# Patient Record
Sex: Female | Born: 1950 | Race: White | Hispanic: No | Marital: Married | Smoking: Current some day smoker
Health system: Southern US, Community
[De-identification: ages and names within clinical notes are randomized; demographics above are authoritative.]

## PROBLEM LIST (undated history)

## (undated) DIAGNOSIS — L409 Psoriasis, unspecified: Secondary | ICD-10-CM

## (undated) DIAGNOSIS — R519 Headache, unspecified: Secondary | ICD-10-CM

## (undated) DIAGNOSIS — Z9889 Other specified postprocedural states: Secondary | ICD-10-CM

## (undated) DIAGNOSIS — I10 Essential (primary) hypertension: Secondary | ICD-10-CM

## (undated) DIAGNOSIS — J189 Pneumonia, unspecified organism: Secondary | ICD-10-CM

## (undated) DIAGNOSIS — J302 Other seasonal allergic rhinitis: Secondary | ICD-10-CM

## (undated) DIAGNOSIS — R0600 Dyspnea, unspecified: Secondary | ICD-10-CM

## (undated) DIAGNOSIS — R112 Nausea with vomiting, unspecified: Secondary | ICD-10-CM

## (undated) DIAGNOSIS — G43909 Migraine, unspecified, not intractable, without status migrainosus: Secondary | ICD-10-CM

## (undated) DIAGNOSIS — D649 Anemia, unspecified: Secondary | ICD-10-CM

## (undated) DIAGNOSIS — T4145XA Adverse effect of unspecified anesthetic, initial encounter: Secondary | ICD-10-CM

## (undated) DIAGNOSIS — J449 Chronic obstructive pulmonary disease, unspecified: Secondary | ICD-10-CM

## (undated) DIAGNOSIS — T8859XA Other complications of anesthesia, initial encounter: Secondary | ICD-10-CM

## (undated) DIAGNOSIS — F17200 Nicotine dependence, unspecified, uncomplicated: Secondary | ICD-10-CM

## (undated) DIAGNOSIS — E785 Hyperlipidemia, unspecified: Secondary | ICD-10-CM

## (undated) HISTORY — PX: TUBAL LIGATION: SHX77

## (undated) HISTORY — PX: PARTIAL HYSTERECTOMY: SHX80

## (undated) HISTORY — DX: Migraine, unspecified, not intractable, without status migrainosus: G43.909

## (undated) HISTORY — DX: Essential (primary) hypertension: I10

## (undated) HISTORY — PX: ABDOMINAL HYSTERECTOMY: SHX81

## (undated) HISTORY — DX: Other seasonal allergic rhinitis: J30.2

## (undated) HISTORY — DX: Psoriasis, unspecified: L40.9

## (undated) HISTORY — PX: CATARACT EXTRACTION: SUR2

## (undated) HISTORY — DX: Hyperlipidemia, unspecified: E78.5

---
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
## 2007-08-29 ENCOUNTER — Ambulatory Visit: Payer: Self-pay | Admitting: Cardiology

## 2007-09-04 ENCOUNTER — Ambulatory Visit: Payer: Self-pay | Admitting: Cardiology

## 2007-10-10 ENCOUNTER — Ambulatory Visit: Payer: Self-pay | Admitting: Cardiology

## 2008-10-24 DIAGNOSIS — R079 Chest pain, unspecified: Secondary | ICD-10-CM | POA: Insufficient documentation

## 2008-10-24 DIAGNOSIS — R0602 Shortness of breath: Secondary | ICD-10-CM | POA: Insufficient documentation

## 2008-10-24 DIAGNOSIS — E785 Hyperlipidemia, unspecified: Secondary | ICD-10-CM | POA: Insufficient documentation

## 2010-06-30 NOTE — Assessment & Plan Note (Signed)
[facility]                          [facility] NOTE   NAME:Flakes, LANESHIA PINA                MRN:          [id number]  DATE:08/29/2007                            DOB:          07/16/1950    REFERRING PHYSICIAN:  Fara Chute, MD   PRIMARY CARDIOLOGIST:  Jonelle Sidle, MD (new).   REASON FOR CONSULTATION:  Ms. Mcclenahan is a pleasant 60 year old female,  with no prior history of heart disease, now referred to Dr. Simona Huh  for evaluation of recent episodes of nocturnal chest pain.   Ms. Miltner refers back to a school trip to [place]. this past  April, at which time she experienced significant dyspnea with moderate  exertion.  She also seems to suggest symptoms of intermittent  claudication, worse on the right, associated with significant walking  that she did during that trip.  When pressed about any chest discomfort,  however, she denies any frank chest pain; however, she clearly had  significant exertional dyspnea but declined to characterize this as  associated with any chest pressure, tightness, or heaviness.   Since her return, she reports 2 discrete episodes of right-sided chest  pain which awoke her in the middle of the night.  Both were associated  with significant diaphoresis, but no radiation to the arms or jaw.  There was also no associated nausea.  She reports that she took aspirin  and that the symptoms subsequently resolved, although she is not clear  as to the duration.  Her last episode was over 1 month ago.   EKG in our office today reveals NSR at 93 bpm with borderline LAD; RSR1  pattern with left hemifascicular block.  There are no ischemic changes.   ALLERGIES:  NOVOCAIN.   HOME MEDICATIONS:  Fish oil, vitamins.   PAST MEDICAL HISTORY:  1. Migraines.  2. Palpitations.   SURGICAL HISTORY:  Partial hysterectomy.   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  She smokes at least  2 packs a day and started at age 89.  She drinks an occasional glass of  red wine.   FAMILY HISTORY:  Brother aged 37, hypertension.   REVIEW OF SYSTEMS:  Denies history of hypertension or diabetes mellitus.  Has not had any palpitations or fluttering, since she cut back on  caffeinated beverages about 6 years ago.  She reportedly can climb a  flight of stairs with some mild shortness of breath and associated leg  pain but denies any associated chest pain.  Otherwise as noted per HPI,  remaining systems negative.   PHYSICAL EXAMINATION:  VITAL SIGNS:  Blood pressure 148/98; pulse 88,  regular; weight 116.  GENERAL:  A 60 year old female, sitting upright, in no distress.  HEENT:  Normocephalic, atraumatic.  NECK:  Palpable carotid pulses without bruits; no JVD at 90 degrees.  LUNGS:  Clear to auscultation bilaterally.  HEART:  Regular rate and rhythm (S1S2).  No significant murmurs.  No  rubs or gallops.  ABDOMEN:  Soft, nontender with intact bowel sounds.  EXTREMITIES:  Palpable bilateral  femoral pulses without bruits; palpable  dorsalis pedis pulses, although less brisk on the left.  Minimally  palpable posterior tibialis pulses bilaterally.  No pedal edema.  Nonpalpable popliteal pulses.  NEURO:  No focal deficit.   IMPRESSION:  1. New-onset chest pain.      a.     Nonexertional.  2. Exertional dyspnea.  3. Remote palpitations.  4. Longstanding tobacco.  5. Hypertension.      a.     Question new onset.  6. Dyslipidemia.  7. History of migraines.   PLAN:  Following review with Dr. Simona Huh, recommendations are as  follows:  1. Exercise stress Cardiolite for risk stratification.  If this      suggests any evidence of ischemia, then recommendation is to      discuss proceeding with further evaluation with a diagnostic      coronary angiogram to exclude significant underlying CAD.  The       patient is quite agreeable with this plan and has opted to proceed      with a noninvasive approach, initially.  Of note, however, I did      advise her that we would keep a low threshold for further workup      with a cardiac catheterization.  2. 2D echocardiogram for assessment of left ventricular function and      rule-out of underlying structural abnormalities.  3. Baseline chest x-ray.  The patient has a longstanding history of      tobacco smoking, has developed significant exertional dyspnea in      the recent past, and reportedly has not had a chest x-ray for some      time now.  4. Recommend starting low-dose aspirin, 81 mg daily.  5. Schedule early clinic followup with myself and Dr. Diona Browner in 1      month, for review of study results and further recommendations.      Rozell Searing, PA-C  Electronically Signed      Jonelle Sidle, MD  Electronically Signed   GS/MedQ  DD: 08/29/2007  DT: 08/30/2007  Job #: [id number]   cc:   Fara Chute, MD

## 2010-06-30 NOTE — Assessment & Plan Note (Signed)
[facility]                          [facility]   NAME:Withers, NIKAELA COYNE                MRN:          [id number]  DATE:10/10/2007                            DOB:          [birth year]    PRIMARY CARE PHYSICIAN:  Dr. Fara Chute.   PRIMARY CARDIOLOGIST:  Jonelle Sidle, MD   REASON FOR VISIT:  One-month followup.   HISTORY OF PRESENT ILLNESS:  Virginia Powell is a 60 year old female patient  who recently saw Gene Serpe, PA-C and Dr. Nona Dell for chest pain  and dyspnea.  She also complained of leg pain and some swelling.  All  these began after a trip to [place] where she was on her feet for  several hours a day.  The swelling has been intermittent.  She was set  up for a stress Cardiolite study.  This showed no ischemia and normal LV  function.  Her echocardiogram also returned normal with an EF of 55% to  60%.  She had some mild mitral regurgitation and mild tricuspid  regurgitation.  RV systolic pressure was 34 millimeters of mercury.  She  had a chest x-ray done secondary to a long history of smoking, which  revealed COPD and no active disease.  She returns to follow up today.  She has had no recurrent chest pain.  She has had no significant  shortness of breath since she was last seen.  She denies syncope, near  syncope.  She did eat a hot dog the other day that resulted in a  migraine headache.  This was fairly typical for her.  She had some  residual lightheadedness yesterday.  She denies any wheezing or cough.  She really denies any symptoms consistent with claudication.  As noted,  her lower extremity swelling has been intermittent.  She has noticed  some palpable nodules associated with the swelling.  She has some  discoloration when the swelling goes down as well.   CURRENT MEDICATIONS:  Multivitamin, vitamin E, fish oil, aspirin, and  Maxalt p.r.n.   PHYSICAL EXAMINATION:  GENERAL:  She is a  well-nourished, well-developed  female, in no distress.  VITAL SIGNS:  Blood pressure is 136/87, pulse of 99, and weight 116.8  pounds.  HEENT:  Normal.  NECK:  Without JVD.  CARDIAC:  Normal S1 and S2.  Regular rate and rhythm.  LUNGS:  Clear to auscultation bilaterally.  ABDOMEN:  Soft and nontender.  EXTREMITIES:  Without edema.  NEUROLOGIC:  She is alert and oriented x3.  Cranial nerves II-XII are  grossly intact.  VASCULAR:  Femoral artery pulses are 2+ bilaterally without bruits.  Popliteal pulses are difficult to palpate.  Dorsalis pedis and posterior  tibialis pulses are 2+ bilaterally.  SKIN:  Warm and dry.  She does have some mild varicosities noted on the  left over the anterior tibia.   ASSESSMENT AND PLAN:  1. Chest pain and shortness breath with recent nonischemic Cardiolite      study and essentially normal echocardiogram except for mild mitral      regurgitation and mild tricuspid regurgitation.  Her chest symptoms  have essentially resolved since she was last seen.  She has been      reassured regarding the findings of the above testing.  No further      cardiovascular testing is warranted at this time.  She can follow      up with our clinic on a p.r.n. basis.  She should continue on      aspirin for primary prevention.  She should also continue follow up      with Dr. Neita Carp for her lipids and proceed with treatment if felt      to be necessary.  2. Leg pain and swelling.  She does have some evidence of      varicosities.  I questioned whether or not she has some venous      insufficiency.  This would coincide with the onset of her symptoms.      She has excellent distal pulses.  She has no evidence of peripheral      arterial disease on exam.  No further testing is warranted at this      time.  3. Dyslipidemia.  As noted above, she should follow up with Dr. Neita Carp      and proceed with treatment if felt to be necessary.  4. Chronic obstructive pulmonary  disease with ongoing tobacco abuse.      The patient has been urged to quit smoking.  She is cutting back      and hopes to quit smoking in the near feature.   DISPOSITION:  As noted above.  The patient will follow up with Dr.  Diona Browner on a p.r.n. basis.      Tereso Newcomer, PA-C  Electronically Signed      Jonelle Sidle, MD  Electronically Signed   SW/MedQ  DD: 10/10/2007  DT: 10/11/2007  Job #: [id number]   cc:   Fara Chute, MD

## 2015-03-03 DIAGNOSIS — I10 Essential (primary) hypertension: Secondary | ICD-10-CM | POA: Insufficient documentation

## 2015-03-03 DIAGNOSIS — L409 Psoriasis, unspecified: Secondary | ICD-10-CM | POA: Insufficient documentation

## 2015-03-04 ENCOUNTER — Ambulatory Visit (INDEPENDENT_AMBULATORY_CARE_PROVIDER_SITE_OTHER): Payer: PRIVATE HEALTH INSURANCE | Admitting: Cardiology

## 2015-03-04 ENCOUNTER — Encounter: Payer: Self-pay | Admitting: Cardiology

## 2015-03-04 VITALS — BP 130/86 | HR 95 | Ht 64.0 in | Wt 116.0 lb

## 2015-03-04 DIAGNOSIS — Z8669 Personal history of other diseases of the nervous system and sense organs: Secondary | ICD-10-CM

## 2015-03-04 DIAGNOSIS — I10 Essential (primary) hypertension: Secondary | ICD-10-CM | POA: Diagnosis not present

## 2015-03-04 DIAGNOSIS — R072 Precordial pain: Secondary | ICD-10-CM | POA: Diagnosis not present

## 2015-03-04 DIAGNOSIS — Z720 Tobacco use: Secondary | ICD-10-CM

## 2015-03-04 DIAGNOSIS — E782 Mixed hyperlipidemia: Secondary | ICD-10-CM

## 2015-03-04 NOTE — Progress Notes (Signed)
Cardiology Office Note  Date: 03/04/2015   ID: Virginia Powell, DOB [birth year], MRN [id number]  PCP: Jobe Marker  Consulting Cardiologist: Nona Dell, MD   Chief Complaint  Patient presents with  . Chest Pain  . Hypertension    History of Present Illness: Virginia Powell is a 65 y.o. female referred for cardiology consultation by Ms. Virginia Daring PA-C with [facility]. She presents today with her husband for further evaluation. She tells me that over the last few months she has had trouble with uncontrolled hypertension, describes diastolics as high as 115 to 120 at times, systolics 160 to 170 at times. She has been on lisinopril for essential hypertension, reports tolerating this medication over the years. Typically, she has been on a very low dose, but dose has been increased in the last few weeks, and her blood pressure does seem to be coming down.  She also reports having an intermittent feeling of cramping in her chest, this has occurred within the last few months as well, not purely exertional. She states that she takes an aspirin when this happens. She is also troubled by increasing migraines recently. At baseline she reports NYHA class II dyspnea.  Record review finds previous evaluation by our practice back in 2009. She underwent a Cardiolite study at that time that was negative for ischemia, also showed normal LVEF. She has not undergone interval ischemic testing.  I reviewed her recent ECG, outlined below. I also reviewed her history and updated the chart.  Past Medical History  Diagnosis Date  . Essential hypertension   . Seasonal allergies   . Hyperlipidemia   . Psoriasis   . Migraines     Past Surgical History  Procedure Laterality Date  . Partial hysterectomy      Current Outpatient Prescriptions  Medication Sig Dispense Refill  . fluticasone (FLONASE) 50 MCG/ACT nasal spray Place into both nostrils daily.    Marland Kitchen lisinopril  (PRINIVIL,ZESTRIL) 10 MG tablet Take 10 mg by mouth 2 (two) times daily.    . rizatriptan (MAXALT-MLT) 10 MG disintegrating tablet Take 10 mg by mouth as needed for migraine. May repeat in 2 hours if needed     No current facility-administered medications for this visit.   Allergies:  Review of patient's allergies indicates no known allergies.   Social History: The patient  reports that she has been smoking Cigarettes.  She does not have any smokeless tobacco history on file. She reports that she does not drink alcohol or use illicit drugs.   Family History: The patient's family history includes Heart attack in her brother; Hyperlipidemia in her brother and mother.   ROS:  Please see the history of present illness. Otherwise, complete review of systems is positive for NYHA class II dyspnea, occasional cough, mild arthritic symptoms.  All other systems are reviewed and negative.   Physical Exam: VS:  BP 130/86 mmHg  Pulse 95  Ht  (1.626 m)  Wt 116 lb (52.617 kg)  BMI 19.90 kg/m2  SpO2 94%, BMI Body mass index is 19.9 kg/(m^2).  Wt Readings from Last 3 Encounters:  03/04/15 116 lb (52.617 kg)    General: Thin woman, appears comfortable at rest. HEENT: Conjunctiva and lids normal, oropharynx clear. Neck: Supple, no elevated JVP or carotid bruits, no thyromegaly. Lungs: Diminished breath sounds throughout without wheezing, nonlabored breathing at rest. Cardiac: Regular rate and rhythm, no S3 or significant systolic murmur, no pericardial rub. Abdomen: Soft, nontender, bowel sounds present, no  guarding or rebound. Extremities: No pitting edema, distal pulses 2+. Skin: Warm and dry. Musculoskeletal: No kyphosis. Neuropsychiatric: Alert and oriented x3, affect grossly appropriate.  ECG: Tracing from 01/16/2015 showed normal sinus rhythm with poor anterior R-wave progression, rule out old anterior infarct pattern, left anterior fascicular block..  Assessment and Plan:  1. History  of essential hypertension with recent uncontrolled blood pressure. Blood pressure trend does look to be improving with recent medication adjustments. She is now on lisinopril at a total of 20 mg daily. I have suggested that she take 10 mg in the morning and 10 mg in the evening, and then this could be further up titrated if needed or perhaps modified with the addition of a low-dose HCTZ in combined formulation. She reports prior intolerance to beta blocker (nose bleeds?), has not been taking Toprol-XL. Requesting most recent lab work from [facility].  2. Intermittent chest discomfort described as a cramping sensation. This has gone along with her elevation in blood pressure. Follow-up stress testing suggested to evaluate for ischemic heart disease as potential contributor. We will arrange an exercise Cardiolite.  3. Long-term tobacco abuse. Patient reports seasonal allergies and uses Flonase. No definite COPD by history but would be suspicious of this as well.  4. History of migraine headaches. Reports that this has been increasing lately as well.  5. Reported hyperlipidemia, not requiring directed medical therapy at this time.  Current medicines were reviewed with the patient today.   Orders Placed This Encounter  Procedures  . NM Myocar Multi W/Spect W/Wall Motion / EF    Disposition: FU with me in 1 month.   Signed, Jonelle Sidle, MD, [facility] 03/04/2015 9:16 AM    [facility] at [facility] [address] [place], [place] [address] Phone: [phone]; Fax: [phone]

## 2015-03-04 NOTE — Patient Instructions (Signed)
Medication Instructions:  Take lisinopril 10 mg two times daily  Labwork: none  Testing/Procedures: Your physician has requested that you have en exercise stress myoview. For further information please visit [email or web address] Please follow instruction sheet, as given.     Follow-Up: Your physician recommends that you schedule a follow-up appointment in: 1 month with DR. MCDOWELL to go over test results    Any Other Special Instructions Will Be Listed Below (If Applicable).     If you need a refill on your cardiac medications before your next appointment, please call your pharmacy.

## 2015-03-11 ENCOUNTER — Encounter (HOSPITAL_COMMUNITY)
Admission: RE | Admit: 2015-03-11 | Discharge: 2015-03-11 | Disposition: A | Discharge status: Home / Self Care | Payer: PRIVATE HEALTH INSURANCE | Source: Ambulatory Visit | Attending: Cardiology | Admitting: Cardiology

## 2015-03-11 ENCOUNTER — Inpatient Hospital Stay (HOSPITAL_COMMUNITY): Admission: RE | Admit: 2015-03-11 | Payer: PRIVATE HEALTH INSURANCE | Source: Ambulatory Visit

## 2015-03-11 ENCOUNTER — Encounter (HOSPITAL_COMMUNITY): Payer: Self-pay

## 2015-03-11 DIAGNOSIS — R072 Precordial pain: Secondary | ICD-10-CM | POA: Diagnosis not present

## 2015-03-11 DIAGNOSIS — R079 Chest pain, unspecified: Secondary | ICD-10-CM | POA: Insufficient documentation

## 2015-03-11 LAB — NM MYOCAR MULTI W/SPECT W/WALL MOTION / EF
CHL RATE OF PERCEIVED EXERTION: 13
CSEPED: 3 min
Estimated workload: 4.6 METS
Exercise duration (sec): 31 s
LV dias vol: 33 mL
LV sys vol: 5 mL
MPHR: 158 {beats}/min
NUC STRESS TID: 0.55
Peak HR: 150 {beats}/min
Percent HR: 94 %
RATE: 0.38
Rest HR: 87 {beats}/min

## 2015-03-11 MED ORDER — TECHNETIUM TC 99M SESTAMIBI GENERIC - CARDIOLITE
10.0000 | Freq: Once | INTRAVENOUS | Status: AC | PRN
  Administered 2015-03-11: 10.2 via INTRAVENOUS

## 2015-03-11 MED ORDER — TECHNETIUM TC 99M SESTAMIBI - CARDIOLITE
30.0000 | Freq: Once | INTRAVENOUS | Status: AC | PRN
  Administered 2015-03-11: 11:00:00 30 via INTRAVENOUS

## 2015-03-11 MED ORDER — SODIUM CHLORIDE 0.9 % IJ SOLN
INTRAMUSCULAR | Status: AC
  Administered 2015-03-11: 10 mL via INTRAVENOUS
  Filled 2015-03-11: qty 3

## 2015-03-11 MED ORDER — REGADENOSON 0.4 MG/5ML IV SOLN
INTRAVENOUS | Status: AC
  Filled 2015-03-11: qty 5

## 2015-04-04 ENCOUNTER — Encounter: Payer: Self-pay | Admitting: Cardiology

## 2015-04-04 ENCOUNTER — Ambulatory Visit (INDEPENDENT_AMBULATORY_CARE_PROVIDER_SITE_OTHER): Payer: PRIVATE HEALTH INSURANCE | Admitting: Cardiology

## 2015-04-04 VITALS — BP 126/80 | HR 90 | Ht 64.0 in | Wt 114.0 lb

## 2015-04-04 DIAGNOSIS — Z720 Tobacco use: Secondary | ICD-10-CM

## 2015-04-04 DIAGNOSIS — R072 Precordial pain: Secondary | ICD-10-CM | POA: Diagnosis not present

## 2015-04-04 DIAGNOSIS — I10 Essential (primary) hypertension: Secondary | ICD-10-CM

## 2015-04-04 NOTE — Progress Notes (Signed)
Cardiology Office Note  Date: 04/04/2015   ID: Virginia Powell, DOB [birth year], MRN [id number]  PCP: Jobe Marker  Primary Cardiologist: Nona Dell, MD   Chief Complaint  Patient presents with  . Follow-up chest pain    History of Present Illness: Virginia Powell is a 65 y.o. female seen recently in January for cardiology consultation from [facility]. She was referred with concerns about blood pressure control and chest discomfort. She presents today with her husband for a follow-up visit. States that she feels much better, has had no recurring chest pain symptoms. She reports much better blood pressure control since he has been taking Altace at higher dose.  We discussed the results of her exercise Cardiolite. Overall perfusion imaging was low risk without definitive ischemia, she did have limited exercise capacity but no diagnostic ST segment changes. Hypertensive response noted.  Past Medical History  Diagnosis Date  . Essential hypertension   . Seasonal allergies   . Hyperlipidemia   . Psoriasis   . Migraines     Current Outpatient Prescriptions  Medication Sig Dispense Refill  . fluticasone (FLONASE) 50 MCG/ACT nasal spray Place into both nostrils daily.    Marland Kitchen lisinopril (PRINIVIL,ZESTRIL) 10 MG tablet Take 10 mg by mouth 2 (two) times daily.    . rizatriptan (MAXALT-MLT) 10 MG disintegrating tablet Take 10 mg by mouth as needed for migraine. May repeat in 2 hours if needed     No current facility-administered medications for this visit.   Allergies:  Review of patient's allergies indicates no known allergies.   Social History: The patient  reports that she has been smoking Cigarettes.  She does not have any smokeless tobacco history on file. She reports that she does not drink alcohol or use illicit drugs.   ROS:  Please see the history of present illness. Otherwise, complete review of systems is positive for none.  All other systems are reviewed  and negative.   Physical Exam: VS:  BP 126/80 mmHg  Pulse 90  Ht  (1.626 m)  Wt 114 lb (51.71 kg)  BMI 19.56 kg/m2  SpO2 94%, BMI Body mass index is 19.56 kg/(m^2).  Wt Readings from Last 3 Encounters:  04/04/15 114 lb (51.71 kg)  03/04/15 116 lb (52.617 kg)    General: Thin woman, appears comfortable at rest. HEENT: Conjunctiva and lids normal, oropharynx clear. Neck: Supple, no elevated JVP or carotid bruits, no thyromegaly. Lungs: Diminished breath sounds throughout without wheezing, nonlabored breathing at rest. Cardiac: Regular rate and rhythm, no S3 or significant systolic murmur, no pericardial rub. Abdomen: Soft, nontender, bowel sounds present, no guarding or rebound. Extremities: No pitting edema, distal pulses 2+.  ECG: I personally reviewed the tracing from 01/16/2015 which showed normal sinus rhythm with poor anterior R-wave progression, rule out old anterior infarct pattern, left anterior fascicular block..  Other Studies Reviewed Today:  Exercise Cardiolite 03/11/2015  Patient with limited exercise tolerance. Technically intermediate risk Duke treadmill score 3.5, although no diagnostic    ST segment changes. There was a hypertensive response and patient reported significant fatigue.  Blood pressure demonstrated a hypertensive response to exercise.  Small, mild intensity, apical anteroseptal defect that is fixed and consistent with soft tissue attenuation. No definitive    evidence of ischemia.  This is a low risk study based on perfusion imaging.  Nuclear stress EF: 84%.  Assessment and Plan:  1. Essential hypertension, blood pressure control is better on higher dose Altace. I have recommended  that she continue with her current regimen and keep follow-up with PCP.  2. Chest pain, currently resolved. Recent exercise Cardiolite was low risk in terms of perfusion imaging with normal LVEF. Would focus on basic risk factor modification strategies including  control of hypertension and smoking cessation.  Current medicines were reviewed with the patient today.  Disposition: FU as needed.  Signed, Jonelle Sidle, MD, [facility] 04/04/2015 10:25 AM    [facility] [address] [place], [place] [address] Phone: [phone]; Fax: [phone]

## 2015-04-04 NOTE — Patient Instructions (Signed)
Your physician recommends that you schedule a follow-up appointment in: as needed   Thank you for choosing [facility] !         

## 2018-01-17 DIAGNOSIS — R69 Illness, unspecified: Secondary | ICD-10-CM | POA: Diagnosis not present

## 2018-03-24 DIAGNOSIS — J069 Acute upper respiratory infection, unspecified: Secondary | ICD-10-CM | POA: Diagnosis not present

## 2018-03-24 DIAGNOSIS — R69 Illness, unspecified: Secondary | ICD-10-CM | POA: Diagnosis not present

## 2018-03-24 DIAGNOSIS — J301 Allergic rhinitis due to pollen: Secondary | ICD-10-CM | POA: Diagnosis not present

## 2018-03-24 DIAGNOSIS — I10 Essential (primary) hypertension: Secondary | ICD-10-CM | POA: Diagnosis not present

## 2018-03-24 DIAGNOSIS — Z681 Body mass index (BMI) 19 or less, adult: Secondary | ICD-10-CM | POA: Diagnosis not present

## 2018-03-24 DIAGNOSIS — G43009 Migraine without aura, not intractable, without status migrainosus: Secondary | ICD-10-CM | POA: Diagnosis not present

## 2018-03-24 DIAGNOSIS — E782 Mixed hyperlipidemia: Secondary | ICD-10-CM | POA: Diagnosis not present

## 2018-04-13 ENCOUNTER — Other Ambulatory Visit (HOSPITAL_COMMUNITY): Payer: Self-pay | Admitting: Family Medicine

## 2018-04-13 DIAGNOSIS — Z1231 Encounter for screening mammogram for malignant neoplasm of breast: Secondary | ICD-10-CM

## 2018-04-14 DIAGNOSIS — R509 Fever, unspecified: Secondary | ICD-10-CM | POA: Diagnosis not present

## 2018-04-14 DIAGNOSIS — R05 Cough: Secondary | ICD-10-CM | POA: Diagnosis not present

## 2018-04-14 DIAGNOSIS — Z6820 Body mass index (BMI) 20.0-20.9, adult: Secondary | ICD-10-CM | POA: Diagnosis not present

## 2018-04-16 DIAGNOSIS — J189 Pneumonia, unspecified organism: Secondary | ICD-10-CM

## 2018-04-16 HISTORY — DX: Pneumonia, unspecified organism: J18.9

## 2018-05-03 ENCOUNTER — Ambulatory Visit (HOSPITAL_COMMUNITY): Payer: Self-pay

## 2018-05-24 ENCOUNTER — Ambulatory Visit (HOSPITAL_COMMUNITY): Payer: Self-pay

## 2018-06-21 ENCOUNTER — Ambulatory Visit (HOSPITAL_COMMUNITY): Payer: Self-pay

## 2018-08-02 ENCOUNTER — Ambulatory Visit (HOSPITAL_COMMUNITY): Payer: Self-pay

## 2018-08-23 ENCOUNTER — Ambulatory Visit (HOSPITAL_COMMUNITY): Payer: Self-pay

## 2018-09-29 DIAGNOSIS — Q181 Preauricular sinus and cyst: Secondary | ICD-10-CM | POA: Diagnosis not present

## 2018-09-29 DIAGNOSIS — Z20828 Contact with and (suspected) exposure to other viral communicable diseases: Secondary | ICD-10-CM | POA: Diagnosis not present

## 2018-09-29 DIAGNOSIS — Z6820 Body mass index (BMI) 20.0-20.9, adult: Secondary | ICD-10-CM | POA: Diagnosis not present

## 2018-10-05 DIAGNOSIS — L720 Epidermal cyst: Secondary | ICD-10-CM | POA: Diagnosis not present

## 2018-10-05 DIAGNOSIS — R69 Illness, unspecified: Secondary | ICD-10-CM | POA: Diagnosis not present

## 2018-10-05 DIAGNOSIS — H6192 Disorder of left external ear, unspecified: Secondary | ICD-10-CM | POA: Diagnosis not present

## 2018-10-10 DIAGNOSIS — R69 Illness, unspecified: Secondary | ICD-10-CM | POA: Diagnosis not present

## 2018-10-10 DIAGNOSIS — L720 Epidermal cyst: Secondary | ICD-10-CM | POA: Diagnosis not present

## 2018-11-13 ENCOUNTER — Encounter: Payer: Self-pay | Admitting: Cardiology

## 2018-11-13 ENCOUNTER — Encounter (HOSPITAL_BASED_OUTPATIENT_CLINIC_OR_DEPARTMENT_OTHER): Payer: Self-pay | Admitting: *Deleted

## 2018-11-13 ENCOUNTER — Other Ambulatory Visit: Payer: Self-pay

## 2018-11-14 NOTE — H&P (Signed)
Virginia Powell is a 68 y.o. female who presents as a consult  Patient.   Referring Provider: Terri Piedra, MD   Chief complaint: Infected cyst.  HPI: Swollen infected painful cyst adjacent to the attachment of the left earlobe.  She just finished Bactrim for 1 week and it feels a little bit better.  She has had this several times over the past 8 years.  She has had it lanced a couple of times.  She is a chronic smoker.  PMH/Meds/All/SocHx/FamHx/ROS:   Past Medical History      Past Medical History:  Diagnosis Date  . Hypertension       Past Surgical History  History reviewed. No pertinent surgical history.    No family history of bleeding disorders, wound healing problems or difficulty with anesthesia.   Social History  Social History        Socioeconomic History  . Marital status: Single    Spouse name: Not on file  . Number of children: Not on file  . Years of education: Not on file  . Highest education level: Not on file  Occupational History  . Not on file  Social Needs  . Financial resource strain: Not on file  . Food insecurity    Worry: Not on file    Inability: Not on file  . Transportation needs    Medical: Not on file    Non-medical: Not on file  Tobacco Use  . Smoking status: Current Every Day Smoker  . Smokeless tobacco: Never Used  Substance and Sexual Activity  . Alcohol use: Not on file  . Drug use: Not on file  . Sexual activity: Not on file  Lifestyle  . Physical activity    Days per week: Not on file    Minutes per session: Not on file  . Stress: Not on file  Relationships  . Social Musician on phone: Not on file    Gets together: Not on file    Attends religious service: Not on file    Active member of club or organization: Not on file    Attends meetings of clubs or organizations: Not on file    Relationship status: Not on file  Other Topics Concern  . Not on file  Social  History Narrative  . Not on file       Current Outpatient Medications:  .  lisinopriL (PRINIVIL,ZESTRIL) 20 MG tablet, TAKE 1 TABLET BY MOUTH ONCE DAILY, Disp: , Rfl:   A complete ROS was performed with pertinent positives/negatives noted in the HPI. The remainder of the ROS are negative.    Physical Exam:    BP 118/78   Pulse 100   Ht 1.575 m (5\' 2" )   Wt 49.9 kg (110 lb)   BMI 20.12 kg/m    General:  Healthy and alert, in no distress, breathing easily. Normal affect. In a pleasant mood. Head: Normocephalic, atraumatic. No masses, or scars. Eyes: Pupils are equal, and reactive to light. Vision is grossly intact. No spontaneous or gaze nystagmus. Ears: Ear canals are clear. Tympanic membranes are intact, with normal landmarks and the middle ears are clear and healthy. Hearing: Grossly normal.  1.5 cm soft slightly tender cystic mass adjacent to the attachment of the left ear lobule. Nose: Nasal cavities are clear with healthy mucosa, no polyps or exudate. Airways are patent. Face: No masses or scars, facial nerve function is symmetric. Oral Cavity: No mucosal abnormalities are noted. Tongue with  normal mobility. Dentition appears healthy. Oropharynx: Tonsils are symmetric. There are no mucosal masses identified. Tongue base appears normal and healthy. Larynx/Hypopharynx: deferred Chest: Deferred Neck: No palpable masses, no cervical adenopathy, no thyroid nodules or enlargement. Neuro: Cranial nerves II-XII with normal function. Balance: Normal gate. Other findings: none.   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  Recommend 1 week of clindamycin.  Recommend surgical excision in the next couple of weeks.  Recommend she stop smoking.  We discussed risks of poor healing after any surgical procedure if she is actively  smoking.

## 2018-11-15 DIAGNOSIS — E785 Hyperlipidemia, unspecified: Secondary | ICD-10-CM | POA: Diagnosis not present

## 2018-11-15 DIAGNOSIS — R69 Illness, unspecified: Secondary | ICD-10-CM | POA: Diagnosis not present

## 2018-11-15 DIAGNOSIS — I10 Essential (primary) hypertension: Secondary | ICD-10-CM | POA: Diagnosis not present

## 2018-11-16 ENCOUNTER — Other Ambulatory Visit (HOSPITAL_COMMUNITY): Payer: PRIVATE HEALTH INSURANCE

## 2018-11-16 ENCOUNTER — Other Ambulatory Visit: Payer: Self-pay

## 2018-11-16 ENCOUNTER — Other Ambulatory Visit (HOSPITAL_COMMUNITY)
Admission: RE | Admit: 2018-11-16 | Discharge: 2018-11-16 | Disposition: A | Discharge status: Home / Self Care | Payer: Medicare HMO | Source: Ambulatory Visit | Attending: Otolaryngology | Admitting: Otolaryngology

## 2018-11-16 DIAGNOSIS — U071 COVID-19: Secondary | ICD-10-CM | POA: Insufficient documentation

## 2018-11-16 DIAGNOSIS — Z20828 Contact with and (suspected) exposure to other viral communicable diseases: Secondary | ICD-10-CM | POA: Diagnosis present

## 2018-11-16 LAB — SARS CORONAVIRUS 2 (TAT 6-24 HRS): SARS Coronavirus 2: POSITIVE — AB

## 2018-11-20 ENCOUNTER — Ambulatory Visit (HOSPITAL_BASED_OUTPATIENT_CLINIC_OR_DEPARTMENT_OTHER)
Admission: RE | Admit: 2018-11-20 | Payer: PRIVATE HEALTH INSURANCE | Source: Home / Self Care | Admitting: Otolaryngology

## 2018-11-20 HISTORY — DX: Other specified postprocedural states: Z98.890

## 2018-11-20 HISTORY — DX: Other complications of anesthesia, initial encounter: T88.59XA

## 2018-11-20 HISTORY — DX: Essential (primary) hypertension: I10

## 2018-11-20 HISTORY — DX: Nicotine dependence, unspecified, uncomplicated: F17.200

## 2018-11-20 HISTORY — DX: Headache, unspecified: R51.9

## 2018-11-20 HISTORY — DX: Chronic obstructive pulmonary disease, unspecified: J44.9

## 2018-11-20 HISTORY — DX: Nausea with vomiting, unspecified: R11.2

## 2018-11-20 SURGERY — EXCISION MASS
Anesthesia: General | Laterality: Left

## 2018-12-15 DIAGNOSIS — E782 Mixed hyperlipidemia: Secondary | ICD-10-CM | POA: Diagnosis not present

## 2018-12-15 DIAGNOSIS — Z681 Body mass index (BMI) 19 or less, adult: Secondary | ICD-10-CM | POA: Diagnosis not present

## 2018-12-15 DIAGNOSIS — G43009 Migraine without aura, not intractable, without status migrainosus: Secondary | ICD-10-CM | POA: Diagnosis not present

## 2018-12-15 DIAGNOSIS — I10 Essential (primary) hypertension: Secondary | ICD-10-CM | POA: Diagnosis not present

## 2018-12-15 DIAGNOSIS — J301 Allergic rhinitis due to pollen: Secondary | ICD-10-CM | POA: Diagnosis not present

## 2018-12-15 DIAGNOSIS — Z6820 Body mass index (BMI) 20.0-20.9, adult: Secondary | ICD-10-CM | POA: Diagnosis not present

## 2018-12-15 DIAGNOSIS — Z23 Encounter for immunization: Secondary | ICD-10-CM | POA: Diagnosis not present

## 2018-12-15 DIAGNOSIS — R69 Illness, unspecified: Secondary | ICD-10-CM | POA: Diagnosis not present

## 2018-12-15 DIAGNOSIS — U071 COVID-19: Secondary | ICD-10-CM | POA: Diagnosis not present

## 2018-12-15 DIAGNOSIS — J069 Acute upper respiratory infection, unspecified: Secondary | ICD-10-CM | POA: Diagnosis not present

## 2019-01-15 DIAGNOSIS — E782 Mixed hyperlipidemia: Secondary | ICD-10-CM | POA: Diagnosis not present

## 2019-01-15 DIAGNOSIS — I10 Essential (primary) hypertension: Secondary | ICD-10-CM | POA: Diagnosis not present

## 2019-03-16 DIAGNOSIS — E7849 Other hyperlipidemia: Secondary | ICD-10-CM | POA: Diagnosis not present

## 2019-03-16 DIAGNOSIS — R69 Illness, unspecified: Secondary | ICD-10-CM | POA: Diagnosis not present

## 2019-04-19 DIAGNOSIS — Z008 Encounter for other general examination: Secondary | ICD-10-CM | POA: Diagnosis not present

## 2019-04-19 DIAGNOSIS — G43909 Migraine, unspecified, not intractable, without status migrainosus: Secondary | ICD-10-CM | POA: Diagnosis not present

## 2019-04-19 DIAGNOSIS — Z79899 Other long term (current) drug therapy: Secondary | ICD-10-CM | POA: Diagnosis not present

## 2019-04-19 DIAGNOSIS — Z85828 Personal history of other malignant neoplasm of skin: Secondary | ICD-10-CM | POA: Diagnosis not present

## 2019-04-19 DIAGNOSIS — Z7982 Long term (current) use of aspirin: Secondary | ICD-10-CM | POA: Diagnosis not present

## 2019-04-19 DIAGNOSIS — J309 Allergic rhinitis, unspecified: Secondary | ICD-10-CM | POA: Diagnosis not present

## 2019-04-19 DIAGNOSIS — R69 Illness, unspecified: Secondary | ICD-10-CM | POA: Diagnosis not present

## 2019-04-19 DIAGNOSIS — I10 Essential (primary) hypertension: Secondary | ICD-10-CM | POA: Diagnosis not present

## 2019-04-19 DIAGNOSIS — Z720 Tobacco use: Secondary | ICD-10-CM | POA: Diagnosis not present

## 2019-04-19 DIAGNOSIS — J42 Unspecified chronic bronchitis: Secondary | ICD-10-CM | POA: Diagnosis not present

## 2019-05-01 DIAGNOSIS — Z1211 Encounter for screening for malignant neoplasm of colon: Secondary | ICD-10-CM | POA: Diagnosis not present

## 2019-05-01 DIAGNOSIS — Z1212 Encounter for screening for malignant neoplasm of rectum: Secondary | ICD-10-CM | POA: Diagnosis not present

## 2019-05-16 DIAGNOSIS — E7849 Other hyperlipidemia: Secondary | ICD-10-CM | POA: Diagnosis not present

## 2019-05-16 DIAGNOSIS — I10 Essential (primary) hypertension: Secondary | ICD-10-CM | POA: Diagnosis not present

## 2019-05-21 DIAGNOSIS — J209 Acute bronchitis, unspecified: Secondary | ICD-10-CM | POA: Diagnosis not present

## 2019-08-15 DIAGNOSIS — I10 Essential (primary) hypertension: Secondary | ICD-10-CM | POA: Diagnosis not present

## 2019-08-15 DIAGNOSIS — G43009 Migraine without aura, not intractable, without status migrainosus: Secondary | ICD-10-CM | POA: Diagnosis not present

## 2019-08-15 DIAGNOSIS — E7849 Other hyperlipidemia: Secondary | ICD-10-CM | POA: Diagnosis not present

## 2019-09-14 DIAGNOSIS — I10 Essential (primary) hypertension: Secondary | ICD-10-CM | POA: Diagnosis not present

## 2019-09-14 DIAGNOSIS — G43009 Migraine without aura, not intractable, without status migrainosus: Secondary | ICD-10-CM | POA: Diagnosis not present

## 2019-09-14 DIAGNOSIS — E7849 Other hyperlipidemia: Secondary | ICD-10-CM | POA: Diagnosis not present

## 2019-10-16 DIAGNOSIS — E7849 Other hyperlipidemia: Secondary | ICD-10-CM | POA: Diagnosis not present

## 2019-10-16 DIAGNOSIS — I10 Essential (primary) hypertension: Secondary | ICD-10-CM | POA: Diagnosis not present

## 2019-10-16 DIAGNOSIS — G43009 Migraine without aura, not intractable, without status migrainosus: Secondary | ICD-10-CM | POA: Diagnosis not present

## 2019-11-15 DIAGNOSIS — G43009 Migraine without aura, not intractable, without status migrainosus: Secondary | ICD-10-CM | POA: Diagnosis not present

## 2019-11-15 DIAGNOSIS — E7849 Other hyperlipidemia: Secondary | ICD-10-CM | POA: Diagnosis not present

## 2019-11-15 DIAGNOSIS — I10 Essential (primary) hypertension: Secondary | ICD-10-CM | POA: Diagnosis not present

## 2019-12-10 DIAGNOSIS — Z23 Encounter for immunization: Secondary | ICD-10-CM | POA: Diagnosis not present

## 2019-12-10 DIAGNOSIS — R0602 Shortness of breath: Secondary | ICD-10-CM | POA: Diagnosis not present

## 2019-12-10 DIAGNOSIS — R69 Illness, unspecified: Secondary | ICD-10-CM | POA: Diagnosis not present

## 2019-12-10 DIAGNOSIS — G43009 Migraine without aura, not intractable, without status migrainosus: Secondary | ICD-10-CM | POA: Diagnosis not present

## 2019-12-10 DIAGNOSIS — Z681 Body mass index (BMI) 19 or less, adult: Secondary | ICD-10-CM | POA: Diagnosis not present

## 2019-12-10 DIAGNOSIS — E782 Mixed hyperlipidemia: Secondary | ICD-10-CM | POA: Diagnosis not present

## 2019-12-10 DIAGNOSIS — E559 Vitamin D deficiency, unspecified: Secondary | ICD-10-CM | POA: Diagnosis not present

## 2019-12-10 DIAGNOSIS — I10 Essential (primary) hypertension: Secondary | ICD-10-CM | POA: Diagnosis not present

## 2019-12-10 DIAGNOSIS — J301 Allergic rhinitis due to pollen: Secondary | ICD-10-CM | POA: Diagnosis not present

## 2019-12-15 DIAGNOSIS — E7849 Other hyperlipidemia: Secondary | ICD-10-CM | POA: Diagnosis not present

## 2019-12-15 DIAGNOSIS — G43009 Migraine without aura, not intractable, without status migrainosus: Secondary | ICD-10-CM | POA: Diagnosis not present

## 2019-12-15 DIAGNOSIS — I10 Essential (primary) hypertension: Secondary | ICD-10-CM | POA: Diagnosis not present

## 2020-01-15 DIAGNOSIS — G43009 Migraine without aura, not intractable, without status migrainosus: Secondary | ICD-10-CM | POA: Diagnosis not present

## 2020-01-15 DIAGNOSIS — I10 Essential (primary) hypertension: Secondary | ICD-10-CM | POA: Diagnosis not present

## 2020-01-15 DIAGNOSIS — E7849 Other hyperlipidemia: Secondary | ICD-10-CM | POA: Diagnosis not present

## 2020-01-18 DIAGNOSIS — R509 Fever, unspecified: Secondary | ICD-10-CM | POA: Diagnosis not present

## 2020-01-18 DIAGNOSIS — R059 Cough, unspecified: Secondary | ICD-10-CM | POA: Diagnosis not present

## 2020-01-18 DIAGNOSIS — R69 Illness, unspecified: Secondary | ICD-10-CM | POA: Diagnosis not present

## 2020-01-18 DIAGNOSIS — R197 Diarrhea, unspecified: Secondary | ICD-10-CM | POA: Diagnosis not present

## 2020-01-18 DIAGNOSIS — J40 Bronchitis, not specified as acute or chronic: Secondary | ICD-10-CM | POA: Diagnosis not present

## 2020-02-21 DIAGNOSIS — Z23 Encounter for immunization: Secondary | ICD-10-CM | POA: Diagnosis not present

## 2020-03-12 DIAGNOSIS — Z0100 Encounter for examination of eyes and vision without abnormal findings: Secondary | ICD-10-CM | POA: Diagnosis not present

## 2020-04-07 DIAGNOSIS — Z720 Tobacco use: Secondary | ICD-10-CM | POA: Diagnosis not present

## 2020-04-07 DIAGNOSIS — R32 Unspecified urinary incontinence: Secondary | ICD-10-CM | POA: Diagnosis not present

## 2020-04-07 DIAGNOSIS — R69 Illness, unspecified: Secondary | ICD-10-CM | POA: Diagnosis not present

## 2020-04-07 DIAGNOSIS — G43909 Migraine, unspecified, not intractable, without status migrainosus: Secondary | ICD-10-CM | POA: Diagnosis not present

## 2020-04-07 DIAGNOSIS — Z7951 Long term (current) use of inhaled steroids: Secondary | ICD-10-CM | POA: Diagnosis not present

## 2020-04-07 DIAGNOSIS — Z008 Encounter for other general examination: Secondary | ICD-10-CM | POA: Diagnosis not present

## 2020-04-07 DIAGNOSIS — J301 Allergic rhinitis due to pollen: Secondary | ICD-10-CM | POA: Diagnosis not present

## 2020-04-07 DIAGNOSIS — Z79899 Other long term (current) drug therapy: Secondary | ICD-10-CM | POA: Diagnosis not present

## 2020-04-07 DIAGNOSIS — E785 Hyperlipidemia, unspecified: Secondary | ICD-10-CM | POA: Diagnosis not present

## 2020-04-07 DIAGNOSIS — J42 Unspecified chronic bronchitis: Secondary | ICD-10-CM | POA: Diagnosis not present

## 2020-04-07 DIAGNOSIS — I10 Essential (primary) hypertension: Secondary | ICD-10-CM | POA: Diagnosis not present

## 2020-04-22 DIAGNOSIS — J209 Acute bronchitis, unspecified: Secondary | ICD-10-CM | POA: Diagnosis not present

## 2020-04-22 DIAGNOSIS — H2512 Age-related nuclear cataract, left eye: Secondary | ICD-10-CM | POA: Diagnosis not present

## 2020-04-22 DIAGNOSIS — J40 Bronchitis, not specified as acute or chronic: Secondary | ICD-10-CM | POA: Diagnosis not present

## 2020-05-14 DIAGNOSIS — G43009 Migraine without aura, not intractable, without status migrainosus: Secondary | ICD-10-CM | POA: Diagnosis not present

## 2020-05-14 DIAGNOSIS — I10 Essential (primary) hypertension: Secondary | ICD-10-CM | POA: Diagnosis not present

## 2020-05-14 DIAGNOSIS — E7849 Other hyperlipidemia: Secondary | ICD-10-CM | POA: Diagnosis not present

## 2020-06-02 DIAGNOSIS — Z0000 Encounter for general adult medical examination without abnormal findings: Secondary | ICD-10-CM | POA: Diagnosis not present

## 2020-06-02 DIAGNOSIS — R059 Cough, unspecified: Secondary | ICD-10-CM | POA: Diagnosis not present

## 2020-06-02 DIAGNOSIS — Z681 Body mass index (BMI) 19 or less, adult: Secondary | ICD-10-CM | POA: Diagnosis not present

## 2020-06-02 DIAGNOSIS — G43009 Migraine without aura, not intractable, without status migrainosus: Secondary | ICD-10-CM | POA: Diagnosis not present

## 2020-06-02 DIAGNOSIS — I10 Essential (primary) hypertension: Secondary | ICD-10-CM | POA: Diagnosis not present

## 2020-06-02 DIAGNOSIS — Q181 Preauricular sinus and cyst: Secondary | ICD-10-CM | POA: Diagnosis not present

## 2020-06-02 DIAGNOSIS — E782 Mixed hyperlipidemia: Secondary | ICD-10-CM | POA: Diagnosis not present

## 2020-06-02 DIAGNOSIS — E7849 Other hyperlipidemia: Secondary | ICD-10-CM | POA: Diagnosis not present

## 2020-06-02 DIAGNOSIS — E559 Vitamin D deficiency, unspecified: Secondary | ICD-10-CM | POA: Diagnosis not present

## 2020-07-07 DIAGNOSIS — J209 Acute bronchitis, unspecified: Secondary | ICD-10-CM | POA: Diagnosis not present

## 2020-07-07 DIAGNOSIS — R509 Fever, unspecified: Secondary | ICD-10-CM | POA: Diagnosis not present

## 2020-07-07 DIAGNOSIS — R69 Illness, unspecified: Secondary | ICD-10-CM | POA: Diagnosis not present

## 2020-07-07 DIAGNOSIS — Z20828 Contact with and (suspected) exposure to other viral communicable diseases: Secondary | ICD-10-CM | POA: Diagnosis not present

## 2020-07-14 DIAGNOSIS — E7849 Other hyperlipidemia: Secondary | ICD-10-CM | POA: Diagnosis not present

## 2020-07-14 DIAGNOSIS — G43009 Migraine without aura, not intractable, without status migrainosus: Secondary | ICD-10-CM | POA: Diagnosis not present

## 2020-07-14 DIAGNOSIS — I10 Essential (primary) hypertension: Secondary | ICD-10-CM | POA: Diagnosis not present

## 2020-08-14 DIAGNOSIS — E7849 Other hyperlipidemia: Secondary | ICD-10-CM | POA: Diagnosis not present

## 2020-08-14 DIAGNOSIS — I10 Essential (primary) hypertension: Secondary | ICD-10-CM | POA: Diagnosis not present

## 2020-08-14 DIAGNOSIS — G43009 Migraine without aura, not intractable, without status migrainosus: Secondary | ICD-10-CM | POA: Diagnosis not present

## 2020-09-04 ENCOUNTER — Other Ambulatory Visit: Payer: Self-pay | Admitting: Family Medicine

## 2020-09-04 DIAGNOSIS — Z1231 Encounter for screening mammogram for malignant neoplasm of breast: Secondary | ICD-10-CM

## 2020-09-09 ENCOUNTER — Ambulatory Visit
Admission: RE | Admit: 2020-09-09 | Discharge: 2020-09-09 | Disposition: A | Discharge status: Home / Self Care | Payer: Medicare HMO | Source: Ambulatory Visit | Attending: Family Medicine | Admitting: Family Medicine

## 2020-09-09 ENCOUNTER — Other Ambulatory Visit: Payer: Self-pay

## 2020-09-09 DIAGNOSIS — Z1231 Encounter for screening mammogram for malignant neoplasm of breast: Secondary | ICD-10-CM | POA: Diagnosis not present

## 2020-09-12 ENCOUNTER — Other Ambulatory Visit: Payer: Self-pay | Admitting: Family Medicine

## 2020-09-12 DIAGNOSIS — R928 Other abnormal and inconclusive findings on diagnostic imaging of breast: Secondary | ICD-10-CM

## 2020-09-14 DIAGNOSIS — E7849 Other hyperlipidemia: Secondary | ICD-10-CM | POA: Diagnosis not present

## 2020-09-14 DIAGNOSIS — G43009 Migraine without aura, not intractable, without status migrainosus: Secondary | ICD-10-CM | POA: Diagnosis not present

## 2020-09-14 DIAGNOSIS — I10 Essential (primary) hypertension: Secondary | ICD-10-CM | POA: Diagnosis not present

## 2020-09-30 ENCOUNTER — Other Ambulatory Visit: Payer: Self-pay

## 2020-09-30 ENCOUNTER — Ambulatory Visit
Admission: RE | Admit: 2020-09-30 | Discharge: 2020-09-30 | Disposition: A | Discharge status: Home / Self Care | Payer: Medicare HMO | Source: Ambulatory Visit | Attending: Family Medicine | Admitting: Family Medicine

## 2020-09-30 ENCOUNTER — Ambulatory Visit: Payer: PRIVATE HEALTH INSURANCE

## 2020-09-30 DIAGNOSIS — R922 Inconclusive mammogram: Secondary | ICD-10-CM | POA: Diagnosis not present

## 2020-09-30 DIAGNOSIS — R928 Other abnormal and inconclusive findings on diagnostic imaging of breast: Secondary | ICD-10-CM

## 2020-10-15 DIAGNOSIS — E7849 Other hyperlipidemia: Secondary | ICD-10-CM | POA: Diagnosis not present

## 2020-10-15 DIAGNOSIS — G43009 Migraine without aura, not intractable, without status migrainosus: Secondary | ICD-10-CM | POA: Diagnosis not present

## 2020-10-15 DIAGNOSIS — I10 Essential (primary) hypertension: Secondary | ICD-10-CM | POA: Diagnosis not present

## 2020-10-22 DIAGNOSIS — E782 Mixed hyperlipidemia: Secondary | ICD-10-CM | POA: Diagnosis not present

## 2020-10-22 DIAGNOSIS — I10 Essential (primary) hypertension: Secondary | ICD-10-CM | POA: Diagnosis not present

## 2020-10-22 DIAGNOSIS — Z681 Body mass index (BMI) 19 or less, adult: Secondary | ICD-10-CM | POA: Diagnosis not present

## 2020-10-22 DIAGNOSIS — E7849 Other hyperlipidemia: Secondary | ICD-10-CM | POA: Diagnosis not present

## 2020-10-22 DIAGNOSIS — J449 Chronic obstructive pulmonary disease, unspecified: Secondary | ICD-10-CM | POA: Diagnosis not present

## 2020-10-22 DIAGNOSIS — R69 Illness, unspecified: Secondary | ICD-10-CM | POA: Diagnosis not present

## 2020-10-22 DIAGNOSIS — G43009 Migraine without aura, not intractable, without status migrainosus: Secondary | ICD-10-CM | POA: Diagnosis not present

## 2020-10-22 DIAGNOSIS — R059 Cough, unspecified: Secondary | ICD-10-CM | POA: Diagnosis not present

## 2020-10-22 DIAGNOSIS — E559 Vitamin D deficiency, unspecified: Secondary | ICD-10-CM | POA: Diagnosis not present

## 2020-11-27 DIAGNOSIS — Z23 Encounter for immunization: Secondary | ICD-10-CM | POA: Diagnosis not present

## 2021-01-14 DIAGNOSIS — G43009 Migraine without aura, not intractable, without status migrainosus: Secondary | ICD-10-CM | POA: Diagnosis not present

## 2021-01-14 DIAGNOSIS — E7849 Other hyperlipidemia: Secondary | ICD-10-CM | POA: Diagnosis not present

## 2021-01-14 DIAGNOSIS — I10 Essential (primary) hypertension: Secondary | ICD-10-CM | POA: Diagnosis not present

## 2021-01-15 DIAGNOSIS — H25813 Combined forms of age-related cataract, bilateral: Secondary | ICD-10-CM | POA: Diagnosis not present

## 2021-01-15 DIAGNOSIS — H40023 Open angle with borderline findings, high risk, bilateral: Secondary | ICD-10-CM | POA: Diagnosis not present

## 2021-02-23 DIAGNOSIS — J209 Acute bronchitis, unspecified: Secondary | ICD-10-CM | POA: Diagnosis not present

## 2021-02-23 DIAGNOSIS — R059 Cough, unspecified: Secondary | ICD-10-CM | POA: Diagnosis not present

## 2021-02-23 DIAGNOSIS — R509 Fever, unspecified: Secondary | ICD-10-CM | POA: Diagnosis not present

## 2021-04-10 DIAGNOSIS — H25811 Combined forms of age-related cataract, right eye: Secondary | ICD-10-CM | POA: Diagnosis not present

## 2021-04-24 DIAGNOSIS — H25812 Combined forms of age-related cataract, left eye: Secondary | ICD-10-CM | POA: Diagnosis not present

## 2021-05-06 DIAGNOSIS — J449 Chronic obstructive pulmonary disease, unspecified: Secondary | ICD-10-CM | POA: Diagnosis not present

## 2021-05-06 DIAGNOSIS — R32 Unspecified urinary incontinence: Secondary | ICD-10-CM | POA: Diagnosis not present

## 2021-05-06 DIAGNOSIS — H04129 Dry eye syndrome of unspecified lacrimal gland: Secondary | ICD-10-CM | POA: Diagnosis not present

## 2021-05-06 DIAGNOSIS — Z8249 Family history of ischemic heart disease and other diseases of the circulatory system: Secondary | ICD-10-CM | POA: Diagnosis not present

## 2021-05-06 DIAGNOSIS — R69 Illness, unspecified: Secondary | ICD-10-CM | POA: Diagnosis not present

## 2021-05-06 DIAGNOSIS — J301 Allergic rhinitis due to pollen: Secondary | ICD-10-CM | POA: Diagnosis not present

## 2021-05-06 DIAGNOSIS — Z85828 Personal history of other malignant neoplasm of skin: Secondary | ICD-10-CM | POA: Diagnosis not present

## 2021-05-06 DIAGNOSIS — G43909 Migraine, unspecified, not intractable, without status migrainosus: Secondary | ICD-10-CM | POA: Diagnosis not present

## 2021-05-06 DIAGNOSIS — Z809 Family history of malignant neoplasm, unspecified: Secondary | ICD-10-CM | POA: Diagnosis not present

## 2021-05-06 DIAGNOSIS — E785 Hyperlipidemia, unspecified: Secondary | ICD-10-CM | POA: Diagnosis not present

## 2021-05-06 DIAGNOSIS — I10 Essential (primary) hypertension: Secondary | ICD-10-CM | POA: Diagnosis not present

## 2021-06-24 DIAGNOSIS — Z1331 Encounter for screening for depression: Secondary | ICD-10-CM | POA: Diagnosis not present

## 2021-06-24 DIAGNOSIS — E7849 Other hyperlipidemia: Secondary | ICD-10-CM | POA: Diagnosis not present

## 2021-06-24 DIAGNOSIS — E559 Vitamin D deficiency, unspecified: Secondary | ICD-10-CM | POA: Diagnosis not present

## 2021-06-24 DIAGNOSIS — Z1389 Encounter for screening for other disorder: Secondary | ICD-10-CM | POA: Diagnosis not present

## 2021-06-24 DIAGNOSIS — G43009 Migraine without aura, not intractable, without status migrainosus: Secondary | ICD-10-CM | POA: Diagnosis not present

## 2021-06-24 DIAGNOSIS — I10 Essential (primary) hypertension: Secondary | ICD-10-CM | POA: Diagnosis not present

## 2021-06-24 DIAGNOSIS — J449 Chronic obstructive pulmonary disease, unspecified: Secondary | ICD-10-CM | POA: Diagnosis not present

## 2021-06-24 DIAGNOSIS — J209 Acute bronchitis, unspecified: Secondary | ICD-10-CM | POA: Diagnosis not present

## 2021-06-30 DIAGNOSIS — I10 Essential (primary) hypertension: Secondary | ICD-10-CM | POA: Diagnosis not present

## 2021-06-30 DIAGNOSIS — R69 Illness, unspecified: Secondary | ICD-10-CM | POA: Diagnosis not present

## 2021-06-30 DIAGNOSIS — R062 Wheezing: Secondary | ICD-10-CM | POA: Diagnosis not present

## 2021-06-30 DIAGNOSIS — J209 Acute bronchitis, unspecified: Secondary | ICD-10-CM | POA: Diagnosis not present

## 2021-06-30 DIAGNOSIS — R0602 Shortness of breath: Secondary | ICD-10-CM | POA: Diagnosis not present

## 2021-09-21 DIAGNOSIS — I10 Essential (primary) hypertension: Secondary | ICD-10-CM | POA: Diagnosis not present

## 2021-09-21 DIAGNOSIS — Z681 Body mass index (BMI) 19 or less, adult: Secondary | ICD-10-CM | POA: Diagnosis not present

## 2021-09-21 DIAGNOSIS — R079 Chest pain, unspecified: Secondary | ICD-10-CM | POA: Diagnosis not present

## 2021-09-21 DIAGNOSIS — R69 Illness, unspecified: Secondary | ICD-10-CM | POA: Diagnosis not present

## 2021-10-02 IMAGING — MG DIGITAL DIAGNOSTIC BILAT W/ TOMO W/ CAD
6 of 10 series · 6 of 30 positions shown · non-contrast
Comparison: Screening mammogram dated 09/09/2020.

CLINICAL DATA: Patient was recalled from screening mammogram for
possible distortion in the right breast and possible mass in the
left breast.

EXAM:
DIGITAL DIAGNOSTIC BILATERAL MAMMOGRAM WITH TOMOSYNTHESIS AND CAD;
ULTRASOUND LEFT BREAST LIMITED
TECHNIQUE: Bilateral digital diagnostic mammography and breast tomosynthesis
was performed. The images were evaluated with computer-aided
detection.; Targeted ultrasound examination of the left breast was
performed.

[R CC synth-2D]
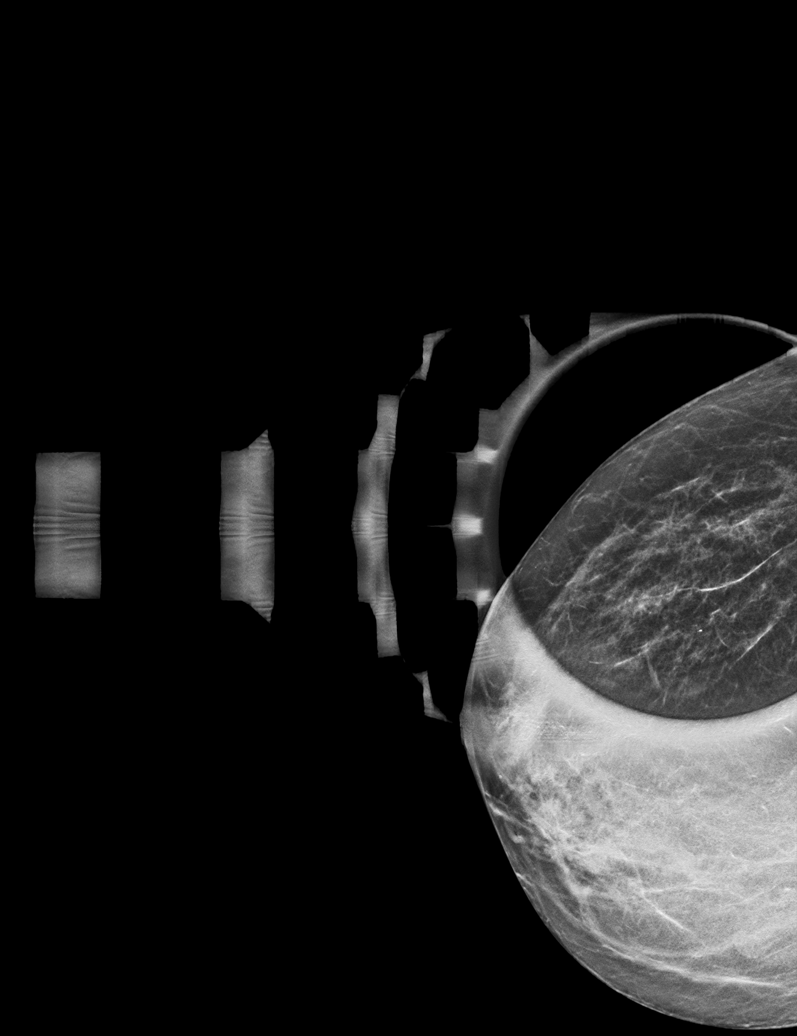

[L MLO synth-2D (1 of 2)]
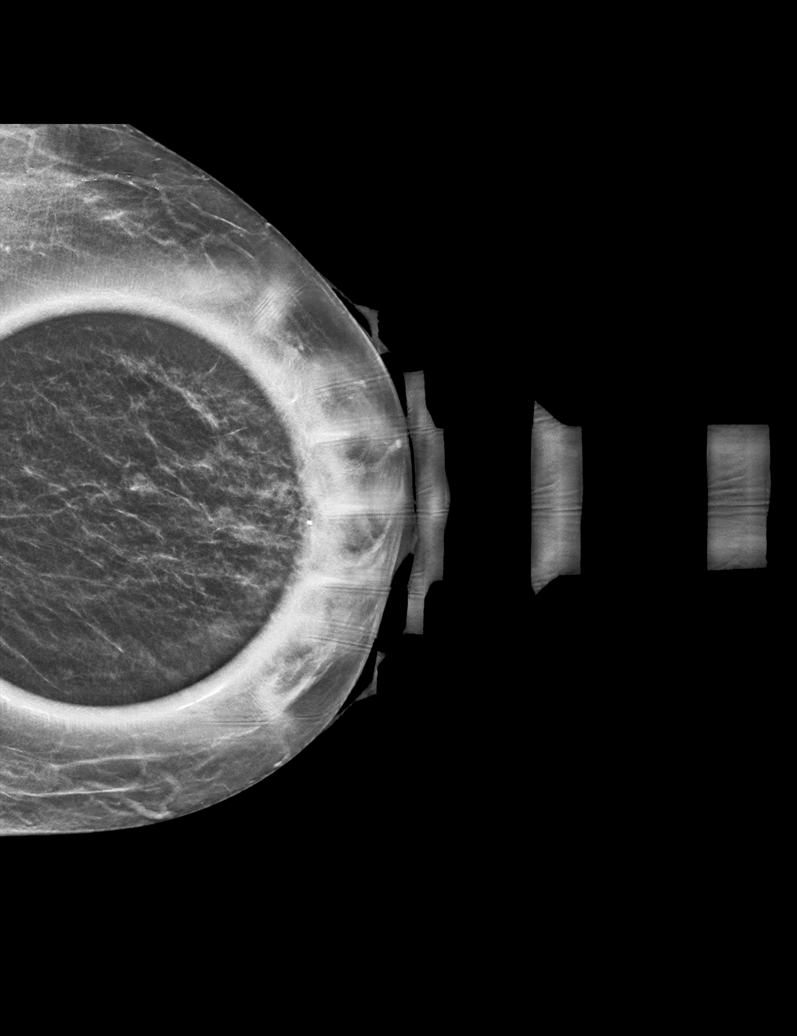

[R ML synth-2D]
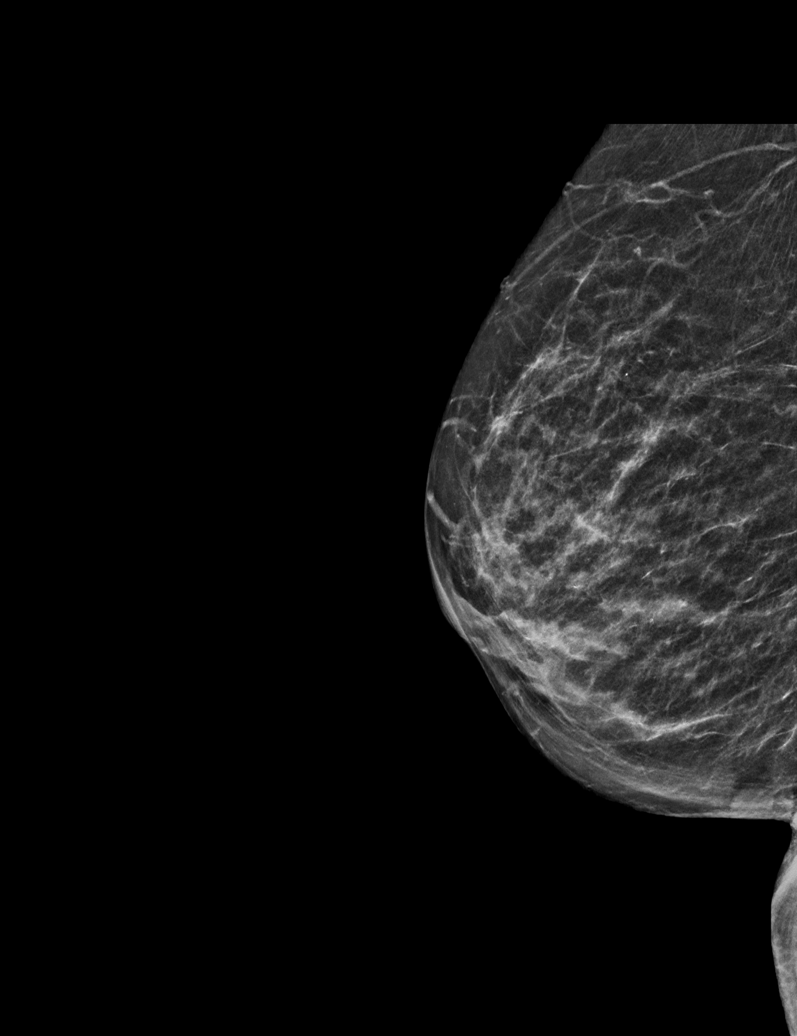

[L CC synth-2D]
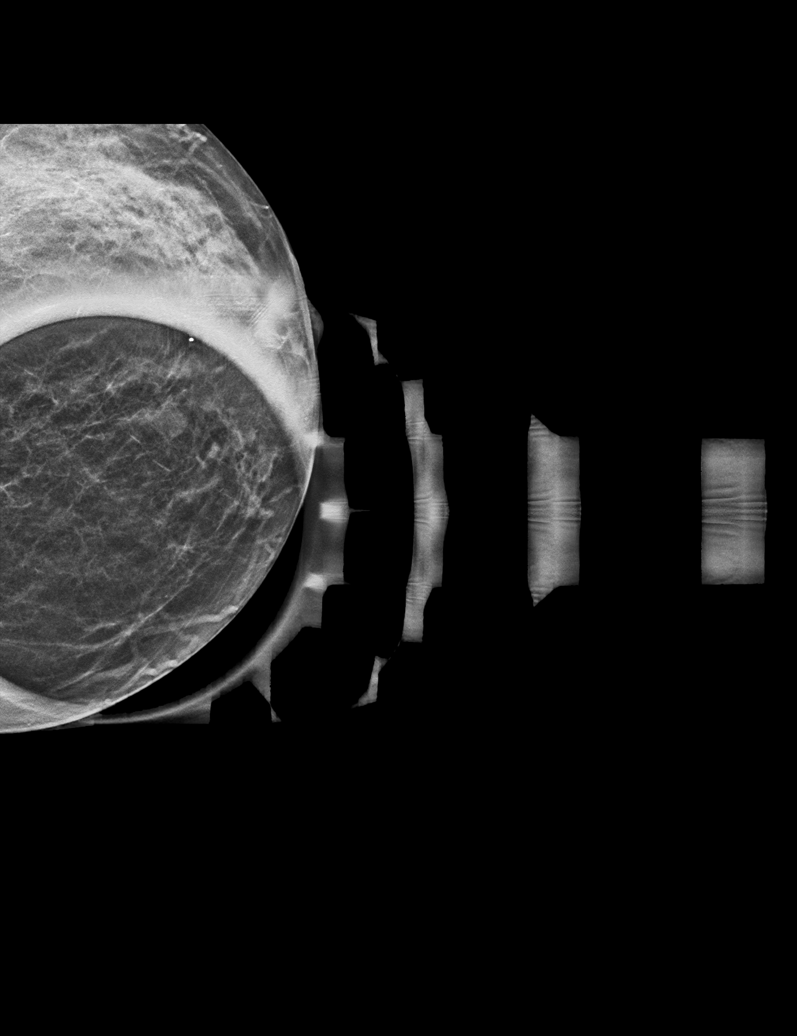

[L MLO synth-2D (2 of 2)]
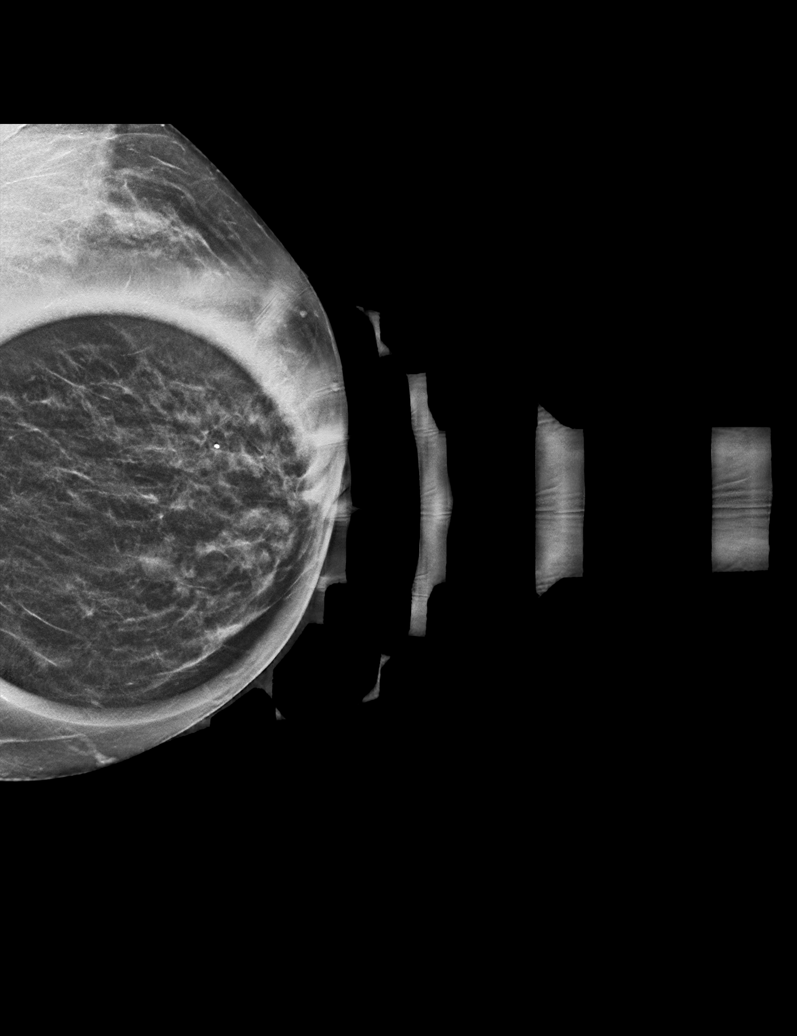

[L MLO tomo · tomo slice 21/40.0]
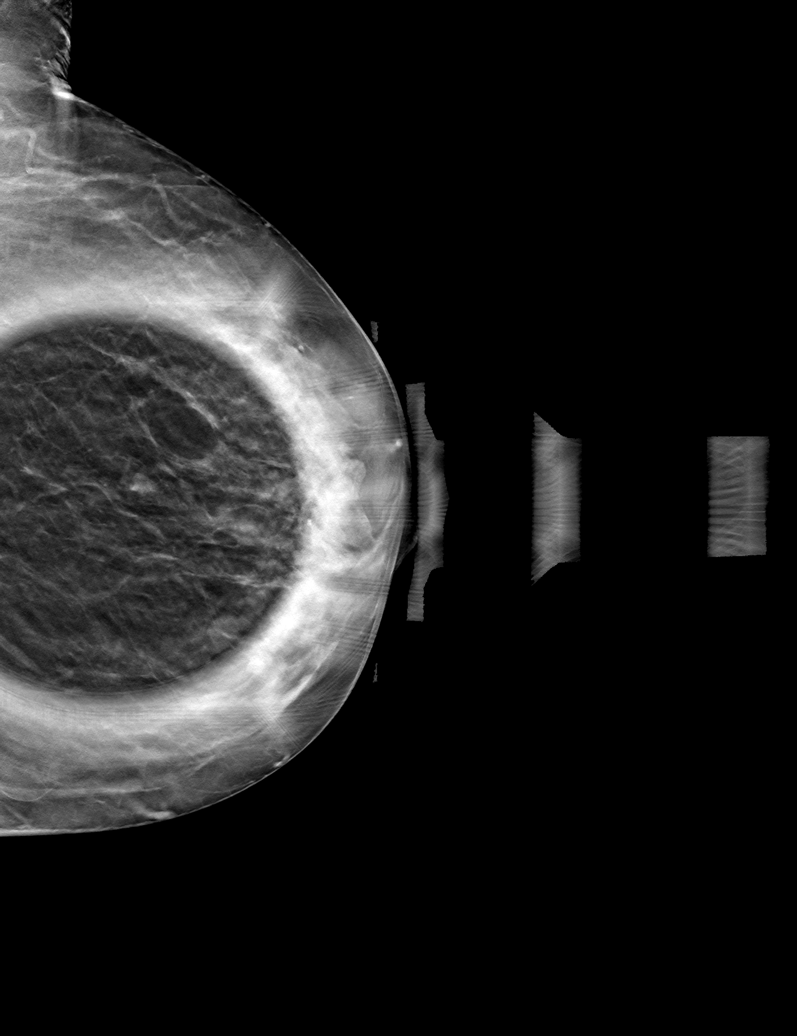

[6 of 30 positions shown; findings below may reference images not displayed]

ACR Breast Density Category b: There are scattered areas of
fibroglandular density.
FINDINGS: Additional imaging of the right breast was performed. There is no
distortion, mass or malignant type microcalcifications.

Additional imaging evaluation of left breast shows persistence of an
8 mm mass in the 9 o'clock region of the breast.

On physical exam, I do not palpate a mass in the 9 o'clock region of
the left breast.

Targeted ultrasound is performed, showing an anechoic cyst in the 9
o'clock retroareolar region of the left breast measuring 7 x 3 x 8
mm. This corresponds with the mammographic abnormality.
IMPRESSION: No evidence of malignancy in either breast. Benign appearing cyst in
the 9 o'clock region of the left breast.

RECOMMENDATION:
Bilateral screening mammogram in 1 year is recommended.

I have discussed the findings and recommendations with the patient.
If applicable, a reminder letter will be sent to the patient
regarding the next appointment.

BI-RADS CATEGORY  2: Benign.

## 2021-10-12 DIAGNOSIS — H5213 Myopia, bilateral: Secondary | ICD-10-CM | POA: Diagnosis not present

## 2021-10-12 DIAGNOSIS — H40023 Open angle with borderline findings, high risk, bilateral: Secondary | ICD-10-CM | POA: Diagnosis not present

## 2021-10-12 DIAGNOSIS — Z961 Presence of intraocular lens: Secondary | ICD-10-CM | POA: Diagnosis not present

## 2021-12-01 DIAGNOSIS — Z23 Encounter for immunization: Secondary | ICD-10-CM | POA: Diagnosis not present

## 2021-12-01 NOTE — Progress Notes (Signed)
CARDIOLOGY CONSULT NOTE       Patient ID: Virginia Powell MRN: [id number] DOB/AGE: [birth year] 71 y.o.  Admit date: (Not on file) Referring Physician: Stana Bunting Primary Physician: Lanelle Bal, PA-C Primary Cardiologist: New previously Domenic Polite Reason for Consultation: HTN/Chest Pain abnormal ECG   HPI:  71 y.o. referred by Dr Araceli Bouche for HTN, chest pain and abnormal ECG.  Seen in his office 09/21/21 and ACE increased with norvasc. ECG with RBBB/LAD. Smoker with COPD and HLD.  Seen by [facility] for chest pain in 2017.  Ex myovue 03/11/15 soft tissue apical defect no ischemia EF 84% On multiple inhalers No recent CXR or lung cancer screening CT  She is very limited with her breathing She has no chest pain palpitations or syncope She is tachycardic in office with bronchitic voice   ROS All other systems reviewed and negative except as noted above  Past Medical History:  Diagnosis Date   Complication of anesthesia    COPD (chronic obstructive pulmonary disease) ([place])    Essential hypertension    Headache    Hyperlipidemia    Hypertension    Migraines    Pneumonia 04/2018   PONV (postoperative nausea and vomiting)    Psoriasis    Seasonal allergies    Smoker     Family History  Problem Relation Age of Onset   Heart attack Brother    Hyperlipidemia Brother    Hyperlipidemia Mother     Social History   Socioeconomic History   Marital status: Unknown    Spouse name: Not on file   Number of children: Not on file   Years of education: Not on file   Highest education level: Not on file  Occupational History   Not on file  Tobacco Use   Smoking status: Every Day    Packs/day: 0.50    Types: Cigarettes   Smokeless tobacco: Never  Substance and Sexual Activity   Alcohol use: Yes    Comment: social   Drug use: Never   Sexual activity: Not on file  Other Topics Concern   Not on file  Social History Narrative   ** Merged History Encounter **       Social  Determinants of Health   Financial Resource Strain: Not on file  Food Insecurity: Not on file  Transportation Needs: Not on file  Physical Activity: Not on file  Stress: Not on file  Social Connections: Not on file  Intimate Partner Violence: Not on file    Past Surgical History:  Procedure Laterality Date   ABDOMINAL HYSTERECTOMY     PARTIAL HYSTERECTOMY     TUBAL LIGATION        Current Outpatient Medications:    fluticasone (FLONASE) 50 MCG/ACT nasal spray, Place into both nostrils daily., Disp: , Rfl:    lisinopril (PRINIVIL,ZESTRIL) 10 MG tablet, Take 10 mg by mouth 2 (two) times daily., Disp: , Rfl:    lisinopril (ZESTRIL) 20 MG tablet, Take 20 mg by mouth daily., Disp: , Rfl:    loratadine (CLARITIN) 10 MG tablet, Take 10 mg by mouth daily., Disp: , Rfl:    rizatriptan (MAXALT-MLT) 10 MG disintegrating tablet, Take 10 mg by mouth as needed for migraine. May repeat in 2 hours if needed, Disp: , Rfl:    rizatriptan (MAXALT-MLT) 10 MG disintegrating tablet, Take 10 mg by mouth as needed for migraine. May repeat in 2 hours if needed, Disp: , Rfl:     Physical Exam: There were no vitals  taken for this visit.    Affect appropriate Chronically ill COPD HEENT: normal Neck supple with no adenopathy JVP normal no bruits no thyromegaly Lungs clear with no wheezing and good diaphragmatic motion Heart:  S1/S2 no murmur, no rub, gallop or click PMI normal Abdomen: benighn, BS positve, no tenderness, no AAA no bruit.  No HSM or HJR Distal pulses intact with no bruits No edema Neuro non-focal Skin warm and dry No muscular weakness   Labs:    Radiology: No results found.  EKG: 2016 SR ICRBBB lateral T wave changes    ASSESSMENT AND PLAN:   Chest Pain:  Smoker with HLD/HTN abnormal ECG normal myovue 2016 with soft tissue attenuation  HR too fast for cardiac CT. Unable to walk on treadmill will consider lexiscan Myovue after other tests  Smoking:  counseled on  cessation < 10 minutes  lung cancer CT Refer to pulmonary HTN:  continue ARB/calcium blocker f/u primary Abnormal ECG:  RBBB/LAD non specific Had ICRBBB in 2016TTE to r/o structural heart dx RBBB likely from rate and lung disease  HLD:  f/u labs with primary   TTE Lung cancer CT Refer to pulmonary   F/U f/u after above studies   Signed: Charlton Haws 12/04/2021, 1:15 PM

## 2021-12-04 ENCOUNTER — Ambulatory Visit: Payer: Medicare HMO | Attending: Cardiovascular Disease | Admitting: Cardiovascular Disease

## 2021-12-04 ENCOUNTER — Encounter: Payer: Self-pay | Admitting: Cardiovascular Disease

## 2021-12-04 VITALS — BP 124/70 | HR 123 | Ht 64.0 in | Wt 104.4 lb

## 2021-12-04 DIAGNOSIS — I4510 Unspecified right bundle-branch block: Secondary | ICD-10-CM

## 2021-12-04 DIAGNOSIS — R0602 Shortness of breath: Secondary | ICD-10-CM | POA: Diagnosis not present

## 2021-12-04 DIAGNOSIS — J449 Chronic obstructive pulmonary disease, unspecified: Secondary | ICD-10-CM

## 2021-12-04 DIAGNOSIS — F17200 Nicotine dependence, unspecified, uncomplicated: Secondary | ICD-10-CM

## 2021-12-04 DIAGNOSIS — R9431 Abnormal electrocardiogram [ECG] [EKG]: Secondary | ICD-10-CM | POA: Diagnosis not present

## 2021-12-04 DIAGNOSIS — R69 Illness, unspecified: Secondary | ICD-10-CM | POA: Diagnosis not present

## 2021-12-04 MED ORDER — HYDRALAZINE HCL 25 MG PO TABS: 25.0000 mg | ORAL_TABLET | Freq: Two times a day (BID) | ORAL | 3 refills | Status: DC

## 2021-12-04 NOTE — Patient Instructions (Signed)
Medication Instructions:  Stop Taking Norvasc  Start Taking Hydroxyzine 25 mg Two times Daily   *If you need a refill on your cardiac medications before your next appointment, please call your pharmacy*   Lab Work: NONE   If you have labs (blood work) drawn today and your tests are completely normal, you will receive your results only by: [place] (if you have MyChart) OR A paper copy in the mail If you have any lab test that is abnormal or we need to change your treatment, we will call you to review the results.   Testing/Procedures: Your physician has requested that you have an echocardiogram. Echocardiography is a painless test that uses sound waves to create images of your heart. It provides your doctor with information about the size and shape of your heart and how well your heart's chambers and valves are working. This procedure takes approximately one hour. There are no restrictions for this procedure. Please do NOT wear cologne, perfume, aftershave, or lotions (deodorant is allowed). Please arrive 15 minutes prior to your appointment time.  Lung cancer CT    Follow-Up: At [facility], you and your health needs are our priority.  As part of our continuing mission to provide you with exceptional heart care, we have created designated Provider Care Teams.  These Care Teams include your primary Cardiologist (physician) and Advanced Practice Providers (APPs -  Physician Assistants and Nurse Practitioners) who all work together to provide you with the care you need, when you need it.  We recommend signing up for the patient portal called "MyChart".  Sign up information is provided on this After Visit Summary.  MyChart is used to connect with patients for Virtual Visits (Telemedicine).  Patients are able to view lab/test results, encounter notes, upcoming appointments, etc.  Non-urgent messages can be sent to your provider as well.   To learn more about what you can do  with MyChart, go to [email or web address].    Your next appointment:    After Testing   The format for your next appointment:   In Person  Provider:   Jenkins Rouge, MD    Other Instructions Thank you for choosing [facility]!    Important Information About Sugar

## 2021-12-21 DIAGNOSIS — E7849 Other hyperlipidemia: Secondary | ICD-10-CM | POA: Diagnosis not present

## 2021-12-21 DIAGNOSIS — J449 Chronic obstructive pulmonary disease, unspecified: Secondary | ICD-10-CM | POA: Diagnosis not present

## 2021-12-21 DIAGNOSIS — E559 Vitamin D deficiency, unspecified: Secondary | ICD-10-CM | POA: Diagnosis not present

## 2021-12-21 DIAGNOSIS — I10 Essential (primary) hypertension: Secondary | ICD-10-CM | POA: Diagnosis not present

## 2021-12-21 DIAGNOSIS — Z1329 Encounter for screening for other suspected endocrine disorder: Secondary | ICD-10-CM | POA: Diagnosis not present

## 2021-12-22 DIAGNOSIS — J449 Chronic obstructive pulmonary disease, unspecified: Secondary | ICD-10-CM | POA: Diagnosis not present

## 2021-12-22 DIAGNOSIS — Z1329 Encounter for screening for other suspected endocrine disorder: Secondary | ICD-10-CM | POA: Diagnosis not present

## 2021-12-22 DIAGNOSIS — I10 Essential (primary) hypertension: Secondary | ICD-10-CM | POA: Diagnosis not present

## 2021-12-22 DIAGNOSIS — E782 Mixed hyperlipidemia: Secondary | ICD-10-CM | POA: Diagnosis not present

## 2021-12-22 DIAGNOSIS — E559 Vitamin D deficiency, unspecified: Secondary | ICD-10-CM | POA: Diagnosis not present

## 2021-12-29 ENCOUNTER — Ambulatory Visit (HOSPITAL_BASED_OUTPATIENT_CLINIC_OR_DEPARTMENT_OTHER)
Admission: RE | Admit: 2021-12-29 | Discharge: 2021-12-29 | Disposition: A | Discharge status: Home / Self Care | Payer: Medicare HMO | Source: Ambulatory Visit | Attending: Cardiovascular Disease | Admitting: Cardiovascular Disease

## 2021-12-29 ENCOUNTER — Ambulatory Visit (HOSPITAL_COMMUNITY)
Admission: RE | Admit: 2021-12-29 | Discharge: 2021-12-29 | Disposition: A | Discharge status: Home / Self Care | Payer: Medicare HMO | Source: Ambulatory Visit | Attending: Cardiovascular Disease | Admitting: Cardiovascular Disease

## 2021-12-29 DIAGNOSIS — I700 Atherosclerosis of aorta: Secondary | ICD-10-CM | POA: Insufficient documentation

## 2021-12-29 DIAGNOSIS — I10 Essential (primary) hypertension: Secondary | ICD-10-CM | POA: Insufficient documentation

## 2021-12-29 DIAGNOSIS — Z8616 Personal history of COVID-19: Secondary | ICD-10-CM | POA: Insufficient documentation

## 2021-12-29 DIAGNOSIS — E785 Hyperlipidemia, unspecified: Secondary | ICD-10-CM | POA: Diagnosis not present

## 2021-12-29 DIAGNOSIS — I2510 Atherosclerotic heart disease of native coronary artery without angina pectoris: Secondary | ICD-10-CM | POA: Diagnosis not present

## 2021-12-29 DIAGNOSIS — R69 Illness, unspecified: Secondary | ICD-10-CM | POA: Diagnosis not present

## 2021-12-29 DIAGNOSIS — J439 Emphysema, unspecified: Secondary | ICD-10-CM | POA: Insufficient documentation

## 2021-12-29 DIAGNOSIS — Z122 Encounter for screening for malignant neoplasm of respiratory organs: Secondary | ICD-10-CM | POA: Diagnosis not present

## 2021-12-29 DIAGNOSIS — F17200 Nicotine dependence, unspecified, uncomplicated: Secondary | ICD-10-CM | POA: Insufficient documentation

## 2021-12-29 DIAGNOSIS — R0602 Shortness of breath: Secondary | ICD-10-CM | POA: Diagnosis not present

## 2021-12-29 DIAGNOSIS — F17210 Nicotine dependence, cigarettes, uncomplicated: Secondary | ICD-10-CM | POA: Insufficient documentation

## 2021-12-29 LAB — ECHOCARDIOGRAM COMPLETE
AR max vel: 2.02 cm2
AV Area VTI: 2.42 cm2
AV Area mean vel: 2.17 cm2
AV Mean grad: 3.2 mmHg
AV Peak grad: 6.2 mmHg
Ao pk vel: 1.25 m/s
Area-P 1/2: 3.53 cm2
S' Lateral: 2.1 cm

## 2021-12-29 NOTE — Progress Notes (Signed)
*  PRELIMINARY RESULTS* Echocardiogram 2D Echocardiogram has been performed.  Stacey Drain 12/29/2021, 1:59 PM

## 2021-12-31 DIAGNOSIS — J209 Acute bronchitis, unspecified: Secondary | ICD-10-CM | POA: Diagnosis not present

## 2021-12-31 DIAGNOSIS — R059 Cough, unspecified: Secondary | ICD-10-CM | POA: Diagnosis not present

## 2021-12-31 DIAGNOSIS — R69 Illness, unspecified: Secondary | ICD-10-CM | POA: Diagnosis not present

## 2021-12-31 DIAGNOSIS — I10 Essential (primary) hypertension: Secondary | ICD-10-CM | POA: Diagnosis not present

## 2021-12-31 DIAGNOSIS — Z23 Encounter for immunization: Secondary | ICD-10-CM | POA: Diagnosis not present

## 2021-12-31 DIAGNOSIS — G43009 Migraine without aura, not intractable, without status migrainosus: Secondary | ICD-10-CM | POA: Diagnosis not present

## 2021-12-31 DIAGNOSIS — J449 Chronic obstructive pulmonary disease, unspecified: Secondary | ICD-10-CM | POA: Diagnosis not present

## 2021-12-31 DIAGNOSIS — Z681 Body mass index (BMI) 19 or less, adult: Secondary | ICD-10-CM | POA: Diagnosis not present

## 2021-12-31 DIAGNOSIS — E7849 Other hyperlipidemia: Secondary | ICD-10-CM | POA: Diagnosis not present

## 2021-12-31 DIAGNOSIS — E559 Vitamin D deficiency, unspecified: Secondary | ICD-10-CM | POA: Diagnosis not present

## 2022-01-04 ENCOUNTER — Telehealth: Payer: Self-pay | Admitting: Cardiovascular Disease

## 2022-01-04 NOTE — Telephone Encounter (Signed)
Wendall Stade, MD 12/30/2021  2:38 PM EST     No lung cancer f/u CT recommended in 6 months due to 6 mm lung nodule

## 2022-01-04 NOTE — Telephone Encounter (Signed)
Patient notified and verbalized understanding. Patient had no questions or concerns at this time. PCP copied 

## 2022-01-04 NOTE — Telephone Encounter (Signed)
Pt returning call for CT results  

## 2022-01-19 ENCOUNTER — Ambulatory Visit: Payer: Medicare HMO | Admitting: Internal Medicine

## 2022-01-19 ENCOUNTER — Encounter: Payer: Self-pay | Admitting: Internal Medicine

## 2022-01-19 VITALS — BP 132/76 | HR 114 | Temp 97.7°F | Ht 64.0 in | Wt 105.8 lb

## 2022-01-19 DIAGNOSIS — J449 Chronic obstructive pulmonary disease, unspecified: Secondary | ICD-10-CM | POA: Diagnosis not present

## 2022-01-19 DIAGNOSIS — I10 Essential (primary) hypertension: Secondary | ICD-10-CM | POA: Diagnosis not present

## 2022-01-19 DIAGNOSIS — R69 Illness, unspecified: Secondary | ICD-10-CM | POA: Diagnosis not present

## 2022-01-19 DIAGNOSIS — F17210 Nicotine dependence, cigarettes, uncomplicated: Secondary | ICD-10-CM

## 2022-01-19 MED ORDER — OLMESARTAN MEDOXOMIL 20 MG PO TABS: 20.0000 mg | ORAL_TABLET | Freq: Every day | ORAL | 2 refills | Status: DC

## 2022-01-19 NOTE — Patient Instructions (Addendum)
Plan A = Automatic = Always=  Trelegy one click daily for now (until clear how much copd you have)   Plan B = Backup (to supplement plan A, not to replace it) Only use your albuterol inhaler as a rescue medication to be used if you can't catch your breath by resting or doing a relaxed purse lip breathing pattern.  - The less you use it, the better it will work when you need it. - Ok to use the inhaler up to 2 puffs  every 4 hours if you must but call for appointment if use goes up over your usual need - Don't leave home without it !!  (think of it like the spare tire for your car)   Plan C = Crisis (instead of Plan B but only if Plan B stops working) - only use your albuterol nebulizer if you first try Plan B and it fails to help > ok to use the nebulizer up to every 4 hours but if start needing it regularly call for immediate appointment    Stop lisinopril and start olmesartan  20 mg one daily   My office will be contacting you by phone for referral to the lung cancer screening program    The key is to stop smoking completely before smoking completely stops you!    PFTs next available in [place]  with visit with me the same day - call sooner

## 2022-01-19 NOTE — Progress Notes (Signed)
Virginia Powell, female    DOB: 1950-10-25    MRN: [id number]   Brief patient profile:  43  yowf  active smoker  referred to pulmonary clinic in [place]  01/19/2022 by Dr Eden Emms  for copd evaluation with acute onset of persistent symptoms 2020 ? All related to covid infection (stayed at home and rx with 02 by husband emt)      History of Present Illness  01/19/2022  Pulmonary/ 1st office eval/  / [place] Office maint on trelegy  and lisinopril  Chief Complaint  Patient presents with   Consult    Consult for nodules noted on CT scan and SOB/COPD   Dyspnea:  mb is 50 ft flat ok easier on trelegy/  Cough:  dry raspy daytime > noct, no am flares  Sleep: able to lie flat  now/ 2 pillows  SABA use: no neb x 6, hfa a few times a week, less on trelegy  02: none  Lung cancer screen: already on program   No obvious day to day or daytime pattern/variability or assoc excess/ purulent sputum or mucus plugs or hemoptysis or cp or chest tightness, subjective wheeze or overt sinus or hb symptoms.   Sleeping  without nocturnal  or early am exacerbation  of respiratory  c/o's or need for noct saba. Also denies any obvious fluctuation of symptoms with weather or environmental changes or other aggravating or alleviating factors except as outlined above   No unusual exposure hx or h/o childhood pna/ asthma or knowledge of premature birth.  Current Allergies, Complete Past Medical History, Past Surgical History, Family History, and Social History were reviewed in [employer] record.  ROS  The following are not active complaints unless bolded Hoarseness, sore throat, dysphagia, dental problems, itching, sneezing,  nasal congestion or discharge of excess mucus or purulent secretions, ear ache,   fever, chills, sweats, unintended wt loss or wt gain, classically pleuritic or exertional cp,  orthopnea pnd or arm/hand swelling  or leg swelling, presyncope, palpitations,  abdominal pain, anorexia, nausea, vomiting, diarrhea  or change in bowel habits or change in bladder habits, change in stools or change in urine, dysuria, hematuria,  rash, arthralgias, visual complaints, headache, numbness, weakness or ataxia or problems with walking or coordination,  change in mood or  memory.           Past Medical History:  Diagnosis Date   Complication of anesthesia    COPD (chronic obstructive pulmonary disease) ([facility])    Essential hypertension    Headache    Hyperlipidemia    Hypertension    Migraines    Pneumonia 04/2018   PONV (postoperative nausea and vomiting)    Psoriasis    Seasonal allergies    Smoker     Outpatient Medications Prior to Visit  Medication Sig Dispense Refill   albuterol (PROVENTIL) (2.5 MG/3ML) 0.083% nebulizer solution SMARTSIG:1 Vial(s) Via Nebulizer Every 4-6 Hours PRN     albuterol (VENTOLIN HFA) 108 (90 Base) MCG/ACT inhaler SMARTSIG:2 Puff(s) By Mouth Every 4 Hours     atorvastatin (LIPITOR) 40 MG tablet Take 40 mg by mouth daily.     fluticasone (FLONASE) 50 MCG/ACT nasal spray Place into both nostrils daily.     hydrALAZINE (APRESOLINE) 25 MG tablet Take 1 tablet (25 mg total) by mouth 2 (two) times daily. 180 tablet 3   hydrOXYzine (ATARAX) 25 MG tablet Take 1 tablet by mouth at bedtime.     lisinopril (ZESTRIL) 40  MG tablet Take 40 mg by mouth daily.     loratadine (CLARITIN) 10 MG tablet Take 10 mg by mouth daily.     rizatriptan (MAXALT-MLT) 10 MG disintegrating tablet Take 10 mg by mouth as needed for migraine. May repeat in 2 hours if needed     TRELEGY ELLIPTA 100-62.5-25 MCG/ACT AEPB Inhale 1 puff into the lungs daily.     No facility-administered medications prior to visit.     Objective:     BP 132/76   Pulse (!) 114   Temp 97.7 F (36.5 C)   Ht 5\' 4"  (1.626 m)   Wt 105 lb 12.8 oz (48 kg)   SpO2 97% Comment: ra  BMI 18.16 kg/m   SpO2: 97 % (ra)  Amb pleasant talkative wm nad   HEENT : Oropharynx   clear   Nasal turbinates nl    NECK :  without  apparent JVD/ palpable Nodes/TM    LUNGS: no acc muscle use,  Min barrel  contour chest wall with bilateral  slightly decreased bs s audible wheeze and  without cough on insp or exp maneuvers and min  Hyperresonant  to  percussion bilaterally    CV:  RRR  no s3 or murmur or increase in P2, and no edema   ABD:  soft and nontender with pos end  insp Hoover's  in the supine position.  No bruits or organomegaly appreciated   MS:  Nl gait/ ext warm without deformities Or obvious joint restrictions  calf tenderness, cyanosis or clubbing     SKIN: warm and dry without lesions    NEURO:  alert, approp, nl sensorium with  no motor or cerebellar deficits apparent.         I personally reviewed images and agree with radiology impression as follows:   Chest LDSCT  12/29/21  Moderate to severe centrilobular and paraseptal emphysema. Biapical pleuroparenchymal scarring identified. No pleural effusion. There are several small lung nodules. The largest nodule is in the posteromedial right lower lobe with a mean derived diameter of 6 mm.     Assessment   COPD  GOLD ? Active smoker  - abrupt onset symptoms  2020 p covid  - Chest LDSCT  12/29/21 Moderate to severe centrilobular and paraseptal emphysema. - 01/19/2022   Walked on RA  x  3  lap(s) =  approx  450 ft  @ mod pace, stopped due to end of study with lowest 02 sats 96% s sob    Hx of normalization of doe p trelegy p abrupt on during covid does not fit copd though the Ct does support emphysema  For now ok to continue trelegy and return for pfts for baseline before considering any change in maint rx    Cigarette smoker Counseled re importance of smoking cessation but did not meet time criteria for separate billing    Referred to cone program to continue  lung cancer screening 01/19/2022 for at least through the age of 70 by present guidelines     Essential hypertension ACE inhibitors  are problematic in  pts with airway complaints because  even experienced pulmonologists can't always distinguish ace effects from copd/asthma.  By themselves they don't actually cause a problem, much like oxygen can't by itself start a fire, but they certainly serve as a powerful catalyst or enhancer for any "fire"  or inflammatory process in the upper airway, be it caused by an ET  tube or more commonly reflux (especially in the  obese or pts with known GERD or who are on biphoshonates).    In the era of ARB near equivalency until we have a better handle on the reversibility of the airway problem, it just makes sense to avoid ACEI  entirely in the short run and then decide later, having established a level of airway control using a reasonable limited regimen, whether to add back ace but even then being very careful to observe the pt for worsening airway control and number of meds used/ needed to control symptoms.    >>> try olmesartan 20 mg daily in place of lisinopril 20 mg daily and return in 6 weeks with all meds in hand using a trust but verify approach to confirm accurate Medication  Reconciliation The principal here is that until we are certain that the  patients are doing what we've asked, it makes no sense to ask them to do more.     Each maintenance medication was reviewed in detail including emphasizing most importantly the difference between maintenance and prns and under what circumstances the prns are to be triggered using an action plan format where appropriate.  Total time for H and P, chart review, counseling, reviewing hfa/dpi device(s) , directly observing portions of ambulatory 02 saturation study/ and generating customized AVS unique to this office visit / same day charting = 60 min new pt eval       Sandrea Hughs, MD 01/19/2022

## 2022-01-19 NOTE — Assessment & Plan Note (Signed)
ACE inhibitors are problematic in  pts with airway complaints because  even experienced pulmonologists can't always distinguish ace effects from copd/asthma.  By themselves they don't actually cause a problem, much like oxygen can't by itself start a fire, but they certainly serve as a powerful catalyst or enhancer for any "fire"  or inflammatory process in the upper airway, be it caused by an ET  tube or more commonly reflux (especially in the obese or pts with known GERD or who are on biphoshonates).    In the era of ARB near equivalency until we have a better handle on the reversibility of the airway problem, it just makes sense to avoid ACEI  entirely in the short run and then decide later, having established a level of airway control using a reasonable limited regimen, whether to add back ace but even then being very careful to observe the pt for worsening airway control and number of meds used/ needed to control symptoms.    >>> try olmesartan 20 mg daily in place of lisinopril 20 mg daily and return in 6 weeks with all meds in hand using a trust but verify approach to confirm accurate Medication  Reconciliation The principal here is that until we are certain that the  patients are doing what we've asked, it makes no sense to ask them to do more.    Each maintenance medication was reviewed in detail including emphasizing most importantly the difference between maintenance and prns and under what circumstances the prns are to be triggered using an action plan format where appropriate.  Total time for H and P, chart review, counseling, reviewing hfa/dpi device(s) , directly observing portions of ambulatory 02 saturation study/ and generating customized AVS unique to this office visit / same day charting = 60 min new pt eval

## 2022-01-19 NOTE — Assessment & Plan Note (Addendum)
Counseled re importance of smoking cessation but did not meet time criteria for separate billing    Referred to cone program to continue  lung cancer screening 01/19/2022 for at least through the age of 28 by present guidelines

## 2022-01-19 NOTE — Assessment & Plan Note (Signed)
Active smoker  - abrupt onset symptoms  2020 p covid  - Chest LDSCT  12/29/21 Moderate to severe centrilobular and paraseptal emphysema. - 01/19/2022   Walked on RA  x  3  lap(s) =  approx  450 ft  @ mod pace, stopped due to end of study with lowest 02 sats 96% s sob    Hx of normalization of doe p trelegy p abrupt on during covid does not fit copd though the Ct does support emphysema  For now ok to continue trelegy and return for pfts for baseline before considering any change in maint rx

## 2022-02-04 ENCOUNTER — Telehealth: Payer: Self-pay | Admitting: Internal Medicine

## 2022-02-04 DIAGNOSIS — I10 Essential (primary) hypertension: Secondary | ICD-10-CM

## 2022-02-04 MED ORDER — OLMESARTAN MEDOXOMIL 40 MG PO TABS: 40.0000 mg | ORAL_TABLET | Freq: Every day | ORAL | 0 refills | Status: AC

## 2022-02-04 NOTE — Telephone Encounter (Signed)
Called and spoke with pt letting her know the info per MW and she verbalized understanding. Stated to pt that we were going to send in a month supply of 40mg  olmesartan but stated to her that after this, PCP should manage Rx and she verbalized understanding. Did state to pt that blood work would need to be done in about a week and she verbalized understanding. Rx sent to preferred pharmacy and labwork ordered.

## 2022-02-04 NOTE — Telephone Encounter (Signed)
Ok but needs a bmet done p about a week on the 40 mg dose or let PCP handle both if it's more convenient for her   Copy to PCP

## 2022-02-04 NOTE — Telephone Encounter (Signed)
Called patient and she states that she is wanting to up her medication dosage of her Olmesartan 20mg . She states she has been taking 40mg  and it works better for her. She states she is almost out of medication and is needing refilled. I explained to patient that I need to get approval from Dr to change dosage to send in refills.   Dr please advise

## 2022-02-12 DIAGNOSIS — Z681 Body mass index (BMI) 19 or less, adult: Secondary | ICD-10-CM | POA: Diagnosis not present

## 2022-02-12 DIAGNOSIS — J111 Influenza due to unidentified influenza virus with other respiratory manifestations: Secondary | ICD-10-CM | POA: Diagnosis not present

## 2022-02-12 DIAGNOSIS — R030 Elevated blood-pressure reading, without diagnosis of hypertension: Secondary | ICD-10-CM | POA: Diagnosis not present

## 2022-02-12 DIAGNOSIS — Z20828 Contact with and (suspected) exposure to other viral communicable diseases: Secondary | ICD-10-CM | POA: Diagnosis not present

## 2022-02-12 DIAGNOSIS — R69 Illness, unspecified: Secondary | ICD-10-CM | POA: Diagnosis not present

## 2022-02-12 DIAGNOSIS — J40 Bronchitis, not specified as acute or chronic: Secondary | ICD-10-CM | POA: Diagnosis not present

## 2022-02-16 DIAGNOSIS — I10 Essential (primary) hypertension: Secondary | ICD-10-CM | POA: Diagnosis not present

## 2022-02-16 DIAGNOSIS — R69 Illness, unspecified: Secondary | ICD-10-CM | POA: Diagnosis not present

## 2022-02-16 DIAGNOSIS — Z681 Body mass index (BMI) 19 or less, adult: Secondary | ICD-10-CM | POA: Diagnosis not present

## 2022-02-16 DIAGNOSIS — J209 Acute bronchitis, unspecified: Secondary | ICD-10-CM | POA: Diagnosis not present

## 2022-03-09 ENCOUNTER — Ambulatory Visit (INDEPENDENT_AMBULATORY_CARE_PROVIDER_SITE_OTHER): Payer: Medicare HMO | Admitting: Internal Medicine

## 2022-03-09 ENCOUNTER — Encounter: Payer: Self-pay | Admitting: Internal Medicine

## 2022-03-09 ENCOUNTER — Ambulatory Visit: Payer: Medicare HMO | Admitting: Internal Medicine

## 2022-03-09 VITALS — BP 124/78 | HR 108 | Temp 98.2°F | Ht 62.75 in | Wt 108.8 lb

## 2022-03-09 DIAGNOSIS — F17210 Nicotine dependence, cigarettes, uncomplicated: Secondary | ICD-10-CM | POA: Diagnosis not present

## 2022-03-09 DIAGNOSIS — R69 Illness, unspecified: Secondary | ICD-10-CM | POA: Diagnosis not present

## 2022-03-09 DIAGNOSIS — J449 Chronic obstructive pulmonary disease, unspecified: Secondary | ICD-10-CM | POA: Diagnosis not present

## 2022-03-09 LAB — PULMONARY FUNCTION TEST
DL/VA % pred: 59 %
DL/VA: 2.49 ml/min/mmHg/L
DLCO cor % pred: 54 %
DLCO cor: 10.3 ml/min/mmHg
DLCO unc % pred: 54 %
DLCO unc: 10.3 ml/min/mmHg
FEF 25-75 Post: 0.44 L/sec
FEF 25-75 Pre: 0.49 L/sec
FEF2575-%Change-Post: -10 %
FEF2575-%Pred-Post: 23 %
FEF2575-%Pred-Pre: 25 %
FEV1-%Change-Post: -1 %
FEV1-%Pred-Post: 54 %
FEV1-%Pred-Pre: 55 %
FEV1-Post: 1.2 L
FEV1-Pre: 1.22 L
FEV1FVC-%Change-Post: 1 %
FEV1FVC-%Pred-Pre: 68 %
FEV6-%Change-Post: 0 %
FEV6-%Pred-Post: 79 %
FEV6-%Pred-Pre: 80 %
FEV6-Post: 2.22 L
FEV6-Pre: 2.25 L
FEV6FVC-%Change-Post: 2 %
FEV6FVC-%Pred-Post: 101 %
FEV6FVC-%Pred-Pre: 99 %
FVC-%Change-Post: -2 %
FVC-%Pred-Post: 78 %
FVC-%Pred-Pre: 80 %
FVC-Post: 2.28 L
FVC-Pre: 2.35 L
Post FEV1/FVC ratio: 53 %
Post FEV6/FVC ratio: 97 %
Pre FEV1/FVC ratio: 52 %
Pre FEV6/FVC Ratio: 95 %
RV % pred: 156 %
RV: 3.32 L
TLC % pred: 114 %
TLC: 5.63 L

## 2022-03-09 NOTE — Progress Notes (Signed)
PFT done today. 

## 2022-03-09 NOTE — Patient Instructions (Signed)
The key is to stop smoking completely before smoking completely stops you!  You have GOLD 2 moderate copd and you do not want it to progress further    Please schedule a follow up visit in 6 months but call sooner if needed  -[place]

## 2022-03-09 NOTE — Progress Notes (Unsigned)
Virginia Powell, female    DOB: 01-10-1951    MRN: [id number]   Brief patient profile:  37  yowf  active smoker  referred to pulmonary clinic in [place]  01/19/2022 by Dr Johnsie Cancel  for copd evaluation with acute onset of persistent symptoms 2020 ? All related to covid infection (stayed at home and rx with 02 by husband emt)      History of Present Illness  01/19/2022  Pulmonary/ 1st office eval/  / [place] on trelegy  and lisinopril  Chief Complaint  Patient presents with   Consult    Consult for nodules noted on CT scan and SOB/COPD   Dyspnea:  mb is 50 ft flat ok easier on trelegy/  Cough:  dry raspy daytime > noct, no am flares  Sleep: able to lie flat  now/ 2 pillows  SABA use: no neb x 6, hfa a few times a week, less on trelegy  02: none  Lung cancer screen: already on program Rec Plan A = Automatic = Always=  Trelegy one click daily for now (until clear how much copd you have)  Plan B = Backup (to supplement plan A, not to replace it) Only use your albuterol inhaler as a rescue medication  Plan C = Crisis (instead of Plan B but only if Plan B stops working) - only use your albuterol nebulizer if you first try Plan B  Stop lisinopril and start olmesartan  20 mg one daily  My office will be contacting you by phone for referral to the lung cancer screening program   The key is to stop smoking completely before smoking completely stops you!  PFTs next available in [place]  with visit with me the same day - call sooner    03/09/2022  f/u ov/ re: copd  maint on trelegy 100  Chief Complaint  Patient presents with   Follow-up    Review PFT today.  Cough improved since changing from lisinopril to olmesartan.   Dyspnea:  lots of walking same pace s 02  Cough: better /  Sleeping: flat bed/ / 2 pillows SABA use: sev times a week 02: none      No obvious day to day or daytime variability or assoc excess/ purulent sputum or mucus plugs or hemoptysis  or cp or chest tightness, subjective wheeze or overt sinus or hb symptoms.   Sleeping  without nocturnal  or early am exacerbation  of respiratory  c/o's or need for noct saba. Also denies any obvious fluctuation of symptoms with weather or environmental changes or other aggravating or alleviating factors except as outlined above   No unusual exposure hx or h/o childhood pna/ asthma or knowledge of premature birth.  Current Allergies, Complete Past Medical History, Past Surgical History, Family History, and Social History were reviewed in [employer] record.  ROS  The following are not active complaints unless bolded Hoarseness, sore throat, dysphagia, dental problems, itching, sneezing,  nasal congestion or discharge of excess mucus or purulent secretions, ear ache,   fever, chills, sweats, unintended wt loss or wt gain, classically pleuritic or exertional cp,  orthopnea pnd or arm/hand swelling  or leg swelling, presyncope, palpitations, abdominal pain, anorexia, nausea, vomiting, diarrhea  or change in bowel habits or change in bladder habits, change in stools or change in urine, dysuria, hematuria,  rash, arthralgias, visual complaints, headache, numbness, weakness or ataxia or problems with walking or coordination,  change in mood or  memory.        Current Meds  Medication Sig   albuterol (PROVENTIL) (2.5 MG/3ML) 0.083% nebulizer solution SMARTSIG:1 Vial(s) Via Nebulizer Every 4-6 Hours PRN   albuterol (VENTOLIN HFA) 108 (90 Base) MCG/ACT inhaler SMARTSIG:2 Puff(s) By Mouth Every 4 Hours   atorvastatin (LIPITOR) 40 MG tablet Take 40 mg by mouth daily.   fluticasone (FLONASE) 50 MCG/ACT nasal spray Place into both nostrils daily as needed.   hydrOXYzine (ATARAX) 25 MG tablet Take 1 tablet by mouth at bedtime.   loratadine (CLARITIN) 10 MG tablet Take 10 mg by mouth daily.   olmesartan (BENICAR) 40 MG tablet Take 1 tablet (40 mg total) by mouth daily.   rizatriptan  (MAXALT-MLT) 10 MG disintegrating tablet Take 10 mg by mouth as needed for migraine. May repeat in 2 hours if needed   TRELEGY ELLIPTA 100-62.5-25 MCG/ACT AEPB Inhale 1 puff into the lungs daily.            Past Medical History:  Diagnosis Date   Complication of anesthesia    COPD (chronic obstructive pulmonary disease) ([place])    Essential hypertension    Headache    Hyperlipidemia    Hypertension    Migraines    Pneumonia 04/2018   PONV (postoperative nausea and vomiting)    Psoriasis    Seasonal allergies    Smoker        Objective:     Wt Readings from Last 3 Encounters:  03/09/22 108 lb 12.8 oz (49.4 kg)  01/19/22 105 lb 12.8 oz (48 kg)  12/04/21 104 lb 6.4 oz (47.4 kg)      Vital signs reviewed  03/09/2022  - Note at rest 02 sats  96% on RA   General appearance:    pleasant am wf      HEENT : Oropharynx  clear      NECK :  without  apparent JVD/ palpable Nodes/TM    LUNGS: no acc muscle use,  Mild barrel  contour chest wall with bilateral  Distant bs s audible wheeze and  without cough on insp or exp maneuvers  and mild  Hyperresonant  to  percussion bilaterally     CV:  RRR  no s3 or murmur or increase in P2, and no edema   ABD:  soft and nontender with pos end  insp Hoover's  in the supine position.  No bruits or organomegaly appreciated   MS:  Nl gait/ ext warm without deformities Or obvious joint restrictions  calf tenderness, cyanosis or clubbing     SKIN: warm and dry without lesions    NEURO:  alert, approp, nl sensorium with  no motor or cerebellar deficits apparent.     I personally reviewed images and agree with radiology impression as follows:   Chest LDSCT  12/29/21  Moderate to severe centrilobular and paraseptal emphysema. Biapical pleuroparenchymal scarring identified. No pleural effusion. There are several small lung nodules. The largest nodule is in the posteromedial right lower lobe with a mean derived diameter of 6 mm.      Assessment

## 2022-03-10 ENCOUNTER — Encounter: Payer: Self-pay | Admitting: Internal Medicine

## 2022-03-10 NOTE — Assessment & Plan Note (Addendum)
Active smoker  - abrupt onset symptoms  2020 p covid  - Chest LDSCT  12/29/21 Moderate to severe centrilobular and paraseptal emphysema. - 01/19/2022   Walked on RA  x  3  lap(s) =  approx  450 ft  @ mod pace, stopped due to end of study with lowest 02 sats 96% s sob   - PFT's 03/09/2022 FEV1 1.22 (55 % ) ratio 0.52  p 0 % improvement from saba p ? prior to study with DLCO  10.30 (54%)   and FV curve classic concave     Group D (now reclassified as E) in terms of symptom/risk and laba/lama/ICS  therefore appropriate rx at this point >>>  trelegy 100 one daily and approp saba:  Re SABA :  I spent extra time with pt today reviewing appropriate use of albuterol for prn use on exertion with the following points: 1) saba is for relief of sob that does not improve by walking a slower pace or resting but rather if the pt does not improve after trying this first. 2) If the pt is convinced, as many are, that saba helps recover from activity faster then it's easy to tell if this is the case by re-challenging : ie stop, take the inhaler, then p 5 minutes try the exact same activity (intensity of workload) that just caused the symptoms and see if they are substantially diminished or not after saba 3) if there is an activity that reproducibly causes the symptoms, try the saba 15 min before the activity on alternate days   If in fact the saba really does help, then fine to continue to use it prn but advised may need to look closer at the maintenance regimen being used to achieve better control of airways disease with exertion.          Each maintenance medication was reviewed in detail including emphasizing most importantly the difference between maintenance and prns and under what circumstances the prns are to be triggered using an action plan format where appropriate.  Total time for H and P, chart review, counseling, reviewing hfa/dpi device(s) and generating customized AVS unique to this office visit / same  day charting = 20 min

## 2022-03-10 NOTE — Assessment & Plan Note (Signed)
4-5 min discussion re active cigarette smoking in addition to office E&M  Ask about tobacco use:   ongoing Advise quitting   I took an extended  opportunity with this patient to outline the consequences of continued cigarette use  in airway disorders based on all the data we have from the multiple national lung health studies (perfomed over decades at millions of dollars in cost)  indicating that smoking cessation, not choice of inhalers or physicians, is the most important aspect of care.   Assess willingness:  Not committed at this point Assist in quit attempt:  Per PCP when ready Arrange follow up:   Follow up per Primary Care planned        

## 2022-03-30 DIAGNOSIS — J209 Acute bronchitis, unspecified: Secondary | ICD-10-CM | POA: Diagnosis not present

## 2022-03-30 DIAGNOSIS — R69 Illness, unspecified: Secondary | ICD-10-CM | POA: Diagnosis not present

## 2022-03-30 DIAGNOSIS — R062 Wheezing: Secondary | ICD-10-CM | POA: Diagnosis not present

## 2022-03-30 DIAGNOSIS — J449 Chronic obstructive pulmonary disease, unspecified: Secondary | ICD-10-CM | POA: Diagnosis not present

## 2022-03-30 DIAGNOSIS — R030 Elevated blood-pressure reading, without diagnosis of hypertension: Secondary | ICD-10-CM | POA: Diagnosis not present

## 2022-03-30 DIAGNOSIS — Z681 Body mass index (BMI) 19 or less, adult: Secondary | ICD-10-CM | POA: Diagnosis not present

## 2022-04-12 DIAGNOSIS — Z961 Presence of intraocular lens: Secondary | ICD-10-CM | POA: Diagnosis not present

## 2022-04-12 DIAGNOSIS — H40023 Open angle with borderline findings, high risk, bilateral: Secondary | ICD-10-CM | POA: Diagnosis not present

## 2022-04-21 DIAGNOSIS — J449 Chronic obstructive pulmonary disease, unspecified: Secondary | ICD-10-CM | POA: Diagnosis not present

## 2022-04-21 DIAGNOSIS — E7849 Other hyperlipidemia: Secondary | ICD-10-CM | POA: Diagnosis not present

## 2022-04-21 DIAGNOSIS — I10 Essential (primary) hypertension: Secondary | ICD-10-CM | POA: Diagnosis not present

## 2022-04-28 DIAGNOSIS — J449 Chronic obstructive pulmonary disease, unspecified: Secondary | ICD-10-CM | POA: Diagnosis not present

## 2022-04-28 DIAGNOSIS — E875 Hyperkalemia: Secondary | ICD-10-CM | POA: Diagnosis not present

## 2022-04-28 DIAGNOSIS — G43009 Migraine without aura, not intractable, without status migrainosus: Secondary | ICD-10-CM | POA: Diagnosis not present

## 2022-04-28 DIAGNOSIS — R059 Cough, unspecified: Secondary | ICD-10-CM | POA: Diagnosis not present

## 2022-04-28 DIAGNOSIS — I10 Essential (primary) hypertension: Secondary | ICD-10-CM | POA: Diagnosis not present

## 2022-04-28 DIAGNOSIS — Z6820 Body mass index (BMI) 20.0-20.9, adult: Secondary | ICD-10-CM | POA: Diagnosis not present

## 2022-04-28 DIAGNOSIS — E559 Vitamin D deficiency, unspecified: Secondary | ICD-10-CM | POA: Diagnosis not present

## 2022-04-28 DIAGNOSIS — R69 Illness, unspecified: Secondary | ICD-10-CM | POA: Diagnosis not present

## 2022-04-28 DIAGNOSIS — E7849 Other hyperlipidemia: Secondary | ICD-10-CM | POA: Diagnosis not present

## 2022-05-12 DIAGNOSIS — E875 Hyperkalemia: Secondary | ICD-10-CM | POA: Diagnosis not present

## 2022-06-23 DIAGNOSIS — J0101 Acute recurrent maxillary sinusitis: Secondary | ICD-10-CM | POA: Diagnosis not present

## 2022-06-23 DIAGNOSIS — F17210 Nicotine dependence, cigarettes, uncomplicated: Secondary | ICD-10-CM | POA: Diagnosis not present

## 2022-06-23 DIAGNOSIS — R030 Elevated blood-pressure reading, without diagnosis of hypertension: Secondary | ICD-10-CM | POA: Diagnosis not present

## 2022-06-23 DIAGNOSIS — Z6820 Body mass index (BMI) 20.0-20.9, adult: Secondary | ICD-10-CM | POA: Diagnosis not present

## 2022-06-23 DIAGNOSIS — J209 Acute bronchitis, unspecified: Secondary | ICD-10-CM | POA: Diagnosis not present

## 2022-06-29 ENCOUNTER — Other Ambulatory Visit: Payer: Self-pay | Admitting: *Deleted

## 2022-06-29 DIAGNOSIS — Z122 Encounter for screening for malignant neoplasm of respiratory organs: Secondary | ICD-10-CM

## 2022-06-29 DIAGNOSIS — F17210 Nicotine dependence, cigarettes, uncomplicated: Secondary | ICD-10-CM

## 2022-06-29 DIAGNOSIS — Z87891 Personal history of nicotine dependence: Secondary | ICD-10-CM

## 2022-08-04 ENCOUNTER — Ambulatory Visit (HOSPITAL_COMMUNITY)
Admission: RE | Admit: 2022-08-04 | Discharge: 2022-08-04 | Disposition: A | Discharge status: Home / Self Care | Payer: Medicare HMO | Source: Ambulatory Visit | Attending: Acute Care | Admitting: Acute Care

## 2022-08-04 ENCOUNTER — Ambulatory Visit (INDEPENDENT_AMBULATORY_CARE_PROVIDER_SITE_OTHER): Payer: Medicare HMO | Admitting: Acute Care

## 2022-08-04 ENCOUNTER — Encounter: Payer: Self-pay | Admitting: Acute Care

## 2022-08-04 DIAGNOSIS — Z87891 Personal history of nicotine dependence: Secondary | ICD-10-CM | POA: Diagnosis not present

## 2022-08-04 DIAGNOSIS — F17210 Nicotine dependence, cigarettes, uncomplicated: Secondary | ICD-10-CM | POA: Insufficient documentation

## 2022-08-04 DIAGNOSIS — Z122 Encounter for screening for malignant neoplasm of respiratory organs: Secondary | ICD-10-CM | POA: Insufficient documentation

## 2022-08-04 NOTE — Patient Instructions (Addendum)
Thank you for participating in the [facility] Cancer Screening Program. It was our pleasure to meet you today. We will call you with the results of your scan within the next few days. Your scan will be assigned a Lung RADS category score by the physicians reading the scans.  This Lung RADS score determines follow up scanning.  See below for description of categories, and follow up screening recommendations. We will be in touch to schedule your follow up screening annually or based on recommendations of our providers. We will fax a copy of your scan results to your Primary Care Physician, or the physician who referred you to the program, to ensure they have the results. Please call the office if you have any questions or concerns regarding your scanning experience or results.  Our office number is [phone]. Please speak with Virginia Phelps, RN. , or  Virginia Buckner RN, They are  our Lung Cancer Screening RN.'s If They are unavailable when you call, Please leave a message on the voice mail. We will return your call at our earliest convenience.This voice mail is monitored several times a day.  Remember, if your scan is normal, we will scan you annually as long as you continue to meet the criteria for the program. (Age 50-80, Current smoker or smoker who has quit within the last 15 years). If you are a smoker, remember, quitting is the single most powerful action that you can take to decrease your risk of lung cancer and other pulmonary, breathing related problems. We know quitting is hard, and we are here to help.  Please let us know if there is anything we can do to help you meet your goal of quitting. If you are a former smoker, congratulations. We are proud of you! Remain smoke free! Remember you can refer friends or family members through the number above.  We will screen them to make sure they meet criteria for the program. Thank you for helping us take better care of you by  participating in Lung Screening.  You can receive free nicotine replacement therapy ( patches, gum or mints) by calling 1-800-QUIT NOW. Please call so we can get you on the path to becoming  a non-smoker. I know it is hard, but you can do this!  Lung RADS Categories:  Lung RADS 1: no nodules or definitely non-concerning nodules.  Recommendation is for a repeat annual scan in 12 months.  Lung RADS 2:  nodules that are non-concerning in appearance and behavior with a very low likelihood of becoming an active cancer. Recommendation is for a repeat annual scan in 12 months.  Lung RADS 3: nodules that are probably non-concerning , includes nodules with a low likelihood of becoming an active cancer.  Recommendation is for a 6-month repeat screening scan. Often noted after an upper respiratory illness. We will be in touch to make sure you have no questions, and to schedule your 6-month scan.  Lung RADS 4 A: nodules with concerning findings, recommendation is most often for a follow up scan in 3 months or additional testing based on our provider's assessment of the scan. We will be in touch to make sure you have no questions and to schedule the recommended 3 month follow up scan.  Lung RADS 4 B:  indicates findings that are concerning. We will be in touch with you to schedule additional diagnostic testing based on our provider's  assessment of the scan.  Other options for assistance in smoking cessation (   As covered by your insurance benefits)  Hypnosis for smoking cessation  [facility] [phone]  Acupuncture for smoking cessation  [facility] [phone]   

## 2022-08-04 NOTE — Progress Notes (Signed)
Virtual Visit via Telephone Note  I connected with Virginia Powell on 08/04/22 at 11:30 AM EDT by telephone and verified that I am speaking with the correct person using two identifiers.  Location: Patient:  At home Provider: [address] [place], [place], [address]    I discussed the limitations, risks, security and privacy concerns of performing an evaluation and management service by telephone and the availability of in person appointments. I also discussed with the patient that there may be a patient responsible charge related to this service. The patient expressed understanding and agreed to proceed.    Shared Decision Making Visit Lung Cancer Screening Program [phone])   Eligibility: Age 72 y.o. Pack Years Smoking History Calculation 89 pack year smoking history (# packs/per year x # years smoked) Recent History of coughing up blood  no Unexplained weight loss? no ( >Than 15 pounds within the last 6 months ) Prior History Lung / other cancer no (Diagnosis within the last 5 years already requiring surveillance chest CT Scans). Smoking Status Current Smoker Former Smokers: Years since quit:  NA  Quit Date:  NA  Visit Components: Discussion included one or more decision making aids. yes Discussion included risk/benefits of screening. yes Discussion included potential follow up diagnostic testing for abnormal scans. yes Discussion included meaning and risk of over diagnosis. yes Discussion included meaning and risk of False Positives. yes Discussion included meaning of total radiation exposure. yes  Counseling Included: Importance of adherence to annual lung cancer LDCT screening. yes Impact of comorbidities on ability to participate in the program. yes Ability and willingness to under diagnostic treatment. yes  Smoking Cessation Counseling: Current Smokers:  Discussed importance of smoking cessation. yes Information about tobacco cessation classes and  interventions provided to patient. yes Patient provided with "ticket" for LDCT Scan. yes Symptomatic Patient. no  Counseling NA Diagnosis Code: Tobacco Use Z72.0 Asymptomatic Patient yes  Counseling (Intermediate counseling: > three minutes counseling) U0454 Former Smokers:  Discussed the importance of maintaining cigarette abstinence. yes Diagnosis Code: Personal History of Nicotine Dependence. U98.119 Information about tobacco cessation classes and interventions provided to patient. Yes Patient provided with "ticket" for LDCT Scan. yes Written Order for Lung Cancer Screening with LDCT placed in Epic. Yes (CT Chest Lung Cancer Screening Low Dose W/O CM) [id number] Z12.2-Screening of respiratory organs Z87.891-Personal history of nicotine dependence  I have spent 25 minutes of face to face/ virtual visit   time with Virginia Powell discussing the risks and benefits of lung cancer screening. We viewed / discussed a power point together that explained in detail the above noted topics. We paused at intervals to allow for questions to be asked and answered to ensure understanding.We discussed that the single most powerful action that she can take to decrease her risk of developing lung cancer is to quit smoking. We discussed whether or not she is ready to commit to setting a quit date. We discussed options for tools to aid in quitting smoking including nicotine replacement therapy, non-nicotine medications, support groups, Quit Smart classes, and behavior modification. We discussed that often times setting smaller, more achievable goals, such as eliminating 1 cigarette a day for a week and then 2 cigarettes a day for a week can be helpful in slowly decreasing the number of cigarettes smoked. This allows for a sense of accomplishment as well as providing a clinical benefit. I provided  her  with smoking cessation  information  with contact information for community resources, classes, free  nicotine replacement  therapy, and access to mobile apps, text messaging, and on-line smoking cessation help. I have also provided  her  the office contact information in the event she needs to contact me, or the screening staff. We discussed the time and location of the scan, and that either Abigail Miyamoto RN, Karlton Lemon, RN  or I will call / send a letter with the results within 24-72 hours of receiving them. The patient verbalized understanding of all of  the above and had no further questions upon leaving the office. They have my contact information in the event they have any further questions.  I spent 3-4 minutes counseling on smoking cessation and the health risks of continued tobacco abuse.  I explained to the patient that there has been a high incidence of coronary artery disease noted on these exams. I explained that this is a non-gated exam therefore degree or severity cannot be determined. This patient is on statin therapy. I have asked the patient to follow-up with their PCP regarding any incidental finding of coronary artery disease and management with diet or medication as their PCP  feels is clinically indicated. The patient verbalized understanding of the above and had no further questions upon completion of the visit.      Bevelyn Ngo, NP 08/04/2022

## 2022-08-09 ENCOUNTER — Telehealth: Payer: Self-pay | Admitting: Acute Care

## 2022-08-09 NOTE — Telephone Encounter (Signed)
Call report with results received through [facility] dashboard.  Routed to Kandice Robinsons, NP as Lorain Childes.   IMPRESSION: 1. Growing solid nodule of the right lower lobe measuring 8 mm and new irregular solid nodule of the left lower lobe measuring 9.3 mm. Lung-RADS 4B, suspicious. Additional imaging evaluation or consultation with Pulmonology or Thoracic Surgery recommended. 2. Coronary artery calcifications, aortic Atherosclerosis (ICD10-I70.0) and Emphysema (ICD10-J43.9).

## 2022-08-11 DIAGNOSIS — Z1211 Encounter for screening for malignant neoplasm of colon: Secondary | ICD-10-CM | POA: Diagnosis not present

## 2022-08-11 DIAGNOSIS — Z1212 Encounter for screening for malignant neoplasm of rectum: Secondary | ICD-10-CM | POA: Diagnosis not present

## 2022-08-13 ENCOUNTER — Telehealth: Payer: Self-pay | Admitting: Acute Care

## 2022-08-13 NOTE — Telephone Encounter (Signed)
I have attempted to call the patient with the results of their  Low Dose CT Chest Lung cancer screening scan. There was no answer. I have left a HIPPA compliant VM requesting the patient call the office for the scan results. I included the office contact information in the message. We will await his return call. If no return call we will continue to call until patient is contacted.   D,D,E, This patient will need PET and follow up in the office with [facility] or [facility]. After to review the results. When she calls back I can place the order then, and we can let the PCP know. Thanks so much.

## 2022-08-18 ENCOUNTER — Telehealth: Payer: Self-pay | Admitting: Acute Care

## 2022-08-18 DIAGNOSIS — R918 Other nonspecific abnormal finding of lung field: Secondary | ICD-10-CM

## 2022-08-18 NOTE — Telephone Encounter (Signed)
See telephone note from 08/18/2022

## 2022-08-18 NOTE — Telephone Encounter (Signed)
I have called the patient to review the results of her low dose CT Chest. This was a 6 month follow up to a lung rads 3. In November she had a 6 mm nodule in the right lower lobe, which has grown to 8 mm on the 6 month follow up. She also has a new 9.3 mm nodule in the left lower lobe that needs additional evaluation. I explained that we will order a PET scan to better evaluate these nodules. She is in agreement with this plan. Sherre Lain and [place], I have ordered the PET scan. She will need an appointment with me or [facility] or [facility] to review the results after the scan has been completed. Please fax results to the PCP and explain the plans for follow up.  Thanks so much  She is continuing to smoke. She was counseled on smoking cessation again today for 4-5 minutes

## 2022-08-18 NOTE — Telephone Encounter (Signed)
Results/ plans faxed to PCP. Will await PET results.  

## 2022-09-02 ENCOUNTER — Other Ambulatory Visit (HOSPITAL_COMMUNITY): Payer: Medicare HMO

## 2022-09-09 ENCOUNTER — Encounter (HOSPITAL_COMMUNITY)
Admission: RE | Admit: 2022-09-09 | Discharge: 2022-09-09 | Disposition: A | Discharge status: Home / Self Care | Payer: Medicare HMO | Source: Ambulatory Visit | Attending: Acute Care | Admitting: Acute Care

## 2022-09-09 DIAGNOSIS — R918 Other nonspecific abnormal finding of lung field: Secondary | ICD-10-CM | POA: Diagnosis not present

## 2022-09-09 DIAGNOSIS — R932 Abnormal findings on diagnostic imaging of liver and biliary tract: Secondary | ICD-10-CM | POA: Diagnosis not present

## 2022-09-09 MED ORDER — FLUDEOXYGLUCOSE F - 18 (FDG) INJECTION
5.4400 | Freq: Once | INTRAVENOUS | Status: AC | PRN
  Administered 2022-09-09: 5.44 via INTRAVENOUS

## 2022-09-15 ENCOUNTER — Encounter: Payer: Self-pay | Admitting: Emergency Medicine

## 2022-09-15 ENCOUNTER — Ambulatory Visit: Payer: Medicare HMO | Admitting: Emergency Medicine

## 2022-09-15 VITALS — BP 120/80 | HR 82 | Temp 98.0°F | Ht 63.0 in | Wt 113.6 lb

## 2022-09-15 DIAGNOSIS — R918 Other nonspecific abnormal finding of lung field: Secondary | ICD-10-CM | POA: Diagnosis not present

## 2022-09-15 NOTE — H&P (View-Only) (Signed)
 Subjective:    Patient ID: Virginia Powell, female    DOB: 01/11/1951, 72 y.o.   MRN: [id number]  HPI 72 year old active smoker (90 pack years) with a history of COPD, hypertension, hyperlipidemia, psoriasis, seasonal allergies.  She has been followed by Dr. Sherene Sires for her COPD.  She participates in lung cancer screening program.  She is here to discuss abnormal chest imaging.  She reports that she has been fairly stable - she has some exertional SOB, managed on trelegy. She uses atrovent HFA, helps her clear mucous. Uses albuterol nebs rarely.   Lung cancer screening CT chest 08/04/2022 reviewed by me showed severe centrilobular emphysema, 8 mm right lower lobe pulmonary nodule that is increased from 6 mm on her prior scan 12/2021.  There is a new irregular solid left lower lobe nodule measuring 9 mm.  PET scan 09/09/2022 reviewed by me shows some evidence for hypermetabolism in the irregular left lower lobe nodule, no hypermetabolism in the enlarging right lower lobe 8 mm pulmonary nodule, evidence for hilar nodal hypermetabolism concerning for metastasis.  I do not see any evidence for distant disease although final read on the study is pending.   Review of Systems As per HPI  Past Medical History:  Diagnosis Date   Complication of anesthesia    COPD (chronic obstructive pulmonary disease) (HCC)    Essential hypertension    Headache    Hyperlipidemia    Hypertension    Migraines    Pneumonia 04/2018   PONV (postoperative nausea and vomiting)    Psoriasis    Seasonal allergies    Smoker      Family History  Problem Relation Age of Onset   Heart attack Brother    Hyperlipidemia Brother    Hyperlipidemia Mother      Social History   Socioeconomic History   Marital status: Married    Spouse name: Not on file   Number of children: Not on file   Years of education: Not on file   Highest education level: Not on file  Occupational History   Not on file  Tobacco Use    Smoking status: Every Day    Current packs/day: 1.62    Average packs/day: 1.6 packs/day for 55.0 years (89.1 ttl pk-yrs)    Types: Cigarettes   Smokeless tobacco: Never  Substance and Sexual Activity   Alcohol use: Yes    Comment: social   Drug use: Never   Sexual activity: Not on file  Other Topics Concern   Not on file  Social History Narrative   ** Merged History Encounter **       Social Determinants of Health   Financial Resource Strain: Not on file  Food Insecurity: Not on file  Transportation Needs: Not on file  Physical Activity: Not on file  Stress: Not on file  Social Connections: Not on file  Intimate Partner Violence: Not on file     Allergies  Allergen Reactions   Morphine And Codeine Nausea And Vomiting     Outpatient Medications Prior to Visit  Medication Sig Dispense Refill   albuterol (PROVENTIL) (2.5 MG/3ML) 0.083% nebulizer solution SMARTSIG:1 Vial(s) Via Nebulizer Every 4-6 Hours PRN     albuterol (VENTOLIN HFA) 108 (90 Base) MCG/ACT inhaler SMARTSIG:2 Puff(s) By Mouth Every 4 Hours     atorvastatin (LIPITOR) 40 MG tablet Take 40 mg by mouth daily.     fluticasone (FLONASE) 50 MCG/ACT nasal spray Place into both nostrils daily as needed.  hydrochlorothiazide (HYDRODIURIL) 12.5 MG tablet Take 12.5 mg by mouth daily.     hydrOXYzine (ATARAX) 25 MG tablet Take 1 tablet by mouth at bedtime.     loratadine (CLARITIN) 10 MG tablet Take 10 mg by mouth daily.     olmesartan (BENICAR) 40 MG tablet Take 1 tablet (40 mg total) by mouth daily. 30 tablet 0   rizatriptan (MAXALT-MLT) 10 MG disintegrating tablet Take 10 mg by mouth as needed for migraine. May repeat in 2 hours if needed     TRELEGY ELLIPTA 100-62.5-25 MCG/ACT AEPB Inhale 1 puff into the lungs daily.     No facility-administered medications prior to visit.        Objective:   Physical Exam  Vitals:   09/15/22 1507  BP: 120/80  Pulse: 82  Temp: 98 F (36.7 C)  TempSrc: Oral  SpO2:  99%  Weight: 113 lb 9.6 oz (51.5 kg)  Height: 5\' 3"  (1.6 m)   Gen: Pleasant, thin, in no distress,  normal affect  ENT: No lesions,  mouth clear,  oropharynx clear, no postnasal drip  Neck: No JVD, no stridor  Lungs: No use of accessory muscles, no crackles or wheezing on normal respiration, bilateral wheezes on forced expiration  Cardiovascular: RRR, heart sounds normal, no murmur or gallops, no peripheral edema  Musculoskeletal: No deformities, no cyanosis or clubbing  Neuro: alert, awake, non focal  Skin: Warm, no lesions or rash      Assessment & Plan:  Pulmonary nodules I reviewed her CT chest and PET scan with her today.  She has a rounded enlarging right lower lobe nodule with associated right-sided hilar adenopathy.  There is also an irregular but solid left lower lobe nodule with hypermetabolism on PET scan as well.  Quite concerning for possible malignancy.  I explained this to her today.  We talked about the pros and cons of bronchoscopy.  She is considering it but wants to talk to her husband.  She will contact me if she is ready for me to schedule it.  I would like for her to see APP back in office so we ensure that a plan is in place.  If she does not want bronchoscopy then we will have to follow serial imaging but I favor diagnostics at this time given the mediastinal adenopathy.  We reviewed your CT scans of the chest and your PET scan today.  There are nodules and lymph nodes that are suspicious for possible lung cancer.  We discussed the pros and cons of performing bronchoscopy to biopsy these areas.  Please consider and discuss further with your husband.  Call our office or send a message through MyChart to let us know if you would like to schedule bronchoscopy.  If so then this will be done under general anesthesia as an outpatient at [facility] endoscopy.  You would need a designated driver. Please continue your inhaled medication as you have been taking them Continue  to work on decreasing your cigarettes Follow with APP in 2 weeks or next available   Levy Pupa, MD, PhD 09/15/2022, 3:39 PM [facility] [phone] or if no answer before 7:00PM call [phone] For any issues after 7:00PM please call eLink [phone]

## 2022-09-15 NOTE — Progress Notes (Signed)
Subjective:    Patient ID: Virginia Powell, female    DOB: 01/11/1951, 72 y.o.   MRN: [id number]  HPI 72 year old active smoker (90 pack years) with a history of COPD, hypertension, hyperlipidemia, psoriasis, seasonal allergies.  She has been followed by Dr. Sherene Sires for her COPD.  She participates in lung cancer screening program.  She is here to discuss abnormal chest imaging.  She reports that she has been fairly stable - she has some exertional SOB, managed on trelegy. She uses atrovent HFA, helps her clear mucous. Uses albuterol nebs rarely.   Lung cancer screening CT chest 08/04/2022 reviewed by me showed severe centrilobular emphysema, 8 mm right lower lobe pulmonary nodule that is increased from 6 mm on her prior scan 12/2021.  There is a new irregular solid left lower lobe nodule measuring 9 mm.  PET scan 09/09/2022 reviewed by me shows some evidence for hypermetabolism in the irregular left lower lobe nodule, no hypermetabolism in the enlarging right lower lobe 8 mm pulmonary nodule, evidence for hilar nodal hypermetabolism concerning for metastasis.  I do not see any evidence for distant disease although final read on the study is pending.   Review of Systems As per HPI  Past Medical History:  Diagnosis Date   Complication of anesthesia    COPD (chronic obstructive pulmonary disease) (HCC)    Essential hypertension    Headache    Hyperlipidemia    Hypertension    Migraines    Pneumonia 04/2018   PONV (postoperative nausea and vomiting)    Psoriasis    Seasonal allergies    Smoker      Family History  Problem Relation Age of Onset   Heart attack Brother    Hyperlipidemia Brother    Hyperlipidemia Mother      Social History   Socioeconomic History   Marital status: Married    Spouse name: Not on file   Number of children: Not on file   Years of education: Not on file   Highest education level: Not on file  Occupational History   Not on file  Tobacco Use    Smoking status: Every Day    Current packs/day: 1.62    Average packs/day: 1.6 packs/day for 55.0 years (89.1 ttl pk-yrs)    Types: Cigarettes   Smokeless tobacco: Never  Substance and Sexual Activity   Alcohol use: Yes    Comment: social   Drug use: Never   Sexual activity: Not on file  Other Topics Concern   Not on file  Social History Narrative   ** Merged History Encounter **       Social Determinants of Health   Financial Resource Strain: Not on file  Food Insecurity: Not on file  Transportation Needs: Not on file  Physical Activity: Not on file  Stress: Not on file  Social Connections: Not on file  Intimate Partner Violence: Not on file     Allergies  Allergen Reactions   Morphine And Codeine Nausea And Vomiting     Outpatient Medications Prior to Visit  Medication Sig Dispense Refill   albuterol (PROVENTIL) (2.5 MG/3ML) 0.083% nebulizer solution SMARTSIG:1 Vial(s) Via Nebulizer Every 4-6 Hours PRN     albuterol (VENTOLIN HFA) 108 (90 Base) MCG/ACT inhaler SMARTSIG:2 Puff(s) By Mouth Every 4 Hours     atorvastatin (LIPITOR) 40 MG tablet Take 40 mg by mouth daily.     fluticasone (FLONASE) 50 MCG/ACT nasal spray Place into both nostrils daily as needed.  hydrochlorothiazide (HYDRODIURIL) 12.5 MG tablet Take 12.5 mg by mouth daily.     hydrOXYzine (ATARAX) 25 MG tablet Take 1 tablet by mouth at bedtime.     loratadine (CLARITIN) 10 MG tablet Take 10 mg by mouth daily.     olmesartan (BENICAR) 40 MG tablet Take 1 tablet (40 mg total) by mouth daily. 30 tablet 0   rizatriptan (MAXALT-MLT) 10 MG disintegrating tablet Take 10 mg by mouth as needed for migraine. May repeat in 2 hours if needed     TRELEGY ELLIPTA 100-62.5-25 MCG/ACT AEPB Inhale 1 puff into the lungs daily.     No facility-administered medications prior to visit.        Objective:   Physical Exam  Vitals:   09/15/22 1507  BP: 120/80  Pulse: 82  Temp: 98 F (36.7 C)  TempSrc: Oral  SpO2:  99%  Weight: 113 lb 9.6 oz (51.5 kg)  Height: 5\' 3"  (1.6 m)   Gen: Pleasant, thin, in no distress,  normal affect  ENT: No lesions,  mouth clear,  oropharynx clear, no postnasal drip  Neck: No JVD, no stridor  Lungs: No use of accessory muscles, no crackles or wheezing on normal respiration, bilateral wheezes on forced expiration  Cardiovascular: RRR, heart sounds normal, no murmur or gallops, no peripheral edema  Musculoskeletal: No deformities, no cyanosis or clubbing  Neuro: alert, awake, non focal  Skin: Warm, no lesions or rash      Assessment & Plan:  Pulmonary nodules I reviewed her CT chest and PET scan with her today.  She has a rounded enlarging right lower lobe nodule with associated right-sided hilar adenopathy.  There is also an irregular but solid left lower lobe nodule with hypermetabolism on PET scan as well.  Quite concerning for possible malignancy.  I explained this to her today.  We talked about the pros and cons of bronchoscopy.  She is considering it but wants to talk to her husband.  She will contact me if she is ready for me to schedule it.  I would like for her to see APP back in office so we ensure that a plan is in place.  If she does not want bronchoscopy then we will have to follow serial imaging but I favor diagnostics at this time given the mediastinal adenopathy.  We reviewed your CT scans of the chest and your PET scan today.  There are nodules and lymph nodes that are suspicious for possible lung cancer.  We discussed the pros and cons of performing bronchoscopy to biopsy these areas.  Please consider and discuss further with your husband.  Call our office or send a message through MyChart to let us know if you would like to schedule bronchoscopy.  If so then this will be done under general anesthesia as an outpatient at [facility] endoscopy.  You would need a designated driver. Please continue your inhaled medication as you have been taking them Continue  to work on decreasing your cigarettes Follow with APP in 2 weeks or next available   Levy Pupa, MD, PhD 09/15/2022, 3:39 PM [facility] [phone] or if no answer before 7:00PM call [phone] For any issues after 7:00PM please call eLink [phone]

## 2022-09-15 NOTE — Patient Instructions (Signed)
We reviewed your CT scans of the chest and your PET scan today.  There are nodules and lymph nodes that are suspicious for possible lung cancer.  We discussed the pros and cons of performing bronchoscopy to biopsy these areas.  Please consider and discuss further with your husband.  Call our office or send a message through MyChart to let us know if you would like to schedule bronchoscopy.  If so then this will be done under general anesthesia as an outpatient at [facility] endoscopy.  You would need a designated driver. Please continue your inhaled medication as you have been taking them Continue to work on decreasing your cigarettes Follow with APP in 2 weeks or next available

## 2022-09-15 NOTE — Assessment & Plan Note (Signed)
I reviewed her CT chest and PET scan with her today.  She has a rounded enlarging right lower lobe nodule with associated right-sided hilar adenopathy.  There is also an irregular but solid left lower lobe nodule with hypermetabolism on PET scan as well.  Quite concerning for possible malignancy.  I explained this to her today.  We talked about the pros and cons of bronchoscopy.  She is considering it but wants to talk to her husband.  She will contact me if she is ready for me to schedule it.  I would like for her to see APP back in office so we ensure that a plan is in place.  If she does not want bronchoscopy then we will have to follow serial imaging but I favor diagnostics at this time given the mediastinal adenopathy.  We reviewed your CT scans of the chest and your PET scan today.  There are nodules and lymph nodes that are suspicious for possible lung cancer.  We discussed the pros and cons of performing bronchoscopy to biopsy these areas.  Please consider and discuss further with your husband.  Call our office or send a message through MyChart to let us know if you would like to schedule bronchoscopy.  If so then this will be done under general anesthesia as an outpatient at [facility] endoscopy.  You would need a designated driver. Please continue your inhaled medication as you have been taking them Continue to work on decreasing your cigarettes Follow with APP in 2 weeks or next available

## 2022-09-16 ENCOUNTER — Telehealth: Payer: Self-pay | Admitting: Emergency Medicine

## 2022-09-16 DIAGNOSIS — R918 Other nonspecific abnormal finding of lung field: Secondary | ICD-10-CM

## 2022-09-21 NOTE — Telephone Encounter (Signed)
Please let the patient know that I have placed orders for bronchoscopy, goal for 10/04/2022.

## 2022-09-22 ENCOUNTER — Encounter: Payer: Self-pay | Admitting: Emergency Medicine

## 2022-09-22 NOTE — Telephone Encounter (Signed)
I called and spoke with patient.  Dr. Kavin Leech message given to patient. Understanding stated.  Nothing further at this time.

## 2022-09-30 ENCOUNTER — Other Ambulatory Visit: Payer: Self-pay

## 2022-09-30 ENCOUNTER — Encounter (HOSPITAL_COMMUNITY): Payer: Self-pay | Admitting: Emergency Medicine

## 2022-09-30 NOTE — Progress Notes (Signed)
SDW call  Patient was given pre-op instructions over the phone. Patient verbalized understanding of instructions provided.   PCP - Lianne Moris, PA-C Cardiologist - Dr. Charlton Haws Pulmonary:    PPM/ICD - denies Device Orders - n/a Rep Notified - n/a   Chest x-ray - n/a EKG -  12/04/2021 Stress Test - ECHO - 12/29/2021 Cardiac Cath -   Sleep Study/sleep apnea/CPAP: denies  Non-diabetic   Blood Thinner Instructions: denies Aspirin Instructions:denies   ERAS Protcol - No, NPO   COVID TEST- n/a    Anesthesia review: No   Patient denies shortness of breath, fever, cough and chest pain over the phone call  Your procedure is scheduled on Monday October 04, 2022  Report to [facility] Main Entrance "A" at  0840  A.M., then check in with the Admitting office.  Call this number if you have problems the morning of surgery:  [phone]   If you have any questions prior to your surgery date call [phone]: Open Monday-Friday 8am-4pm If you experience any cold or flu symptoms such as cough, fever, chills, shortness of breath, etc. between now and your scheduled surgery, please notify us at the above number    Remember:  Do not eat or drink after midnight the night before your surgery   Take these medicines the morning of surgery with A SIP OF WATER:  Atorvastatin, trelegy ellipta  As needed: Albuterol neb and inhaler, flonase, claritin  As of today, STOP taking any Aspirin (unless otherwise instructed by your surgeon) Aleve, Naproxen, Ibuprofen, Motrin, Advil, Goody's, BC's, all herbal medications, fish oil, and all vitamins.

## 2022-10-02 ENCOUNTER — Ambulatory Visit (HOSPITAL_BASED_OUTPATIENT_CLINIC_OR_DEPARTMENT_OTHER)
Admission: RE | Admit: 2022-10-02 | Discharge: 2022-10-02 | Disposition: A | Discharge status: Home / Self Care | Payer: Medicare HMO | Source: Ambulatory Visit | Attending: Family Medicine | Admitting: Family Medicine

## 2022-10-02 DIAGNOSIS — I2510 Atherosclerotic heart disease of native coronary artery without angina pectoris: Secondary | ICD-10-CM | POA: Diagnosis not present

## 2022-10-02 DIAGNOSIS — R918 Other nonspecific abnormal finding of lung field: Secondary | ICD-10-CM

## 2022-10-02 DIAGNOSIS — N281 Cyst of kidney, acquired: Secondary | ICD-10-CM | POA: Diagnosis not present

## 2022-10-02 DIAGNOSIS — J439 Emphysema, unspecified: Secondary | ICD-10-CM | POA: Diagnosis not present

## 2022-10-04 ENCOUNTER — Ambulatory Visit (HOSPITAL_BASED_OUTPATIENT_CLINIC_OR_DEPARTMENT_OTHER): Payer: Medicare HMO | Admitting: Anesthesiology

## 2022-10-04 ENCOUNTER — Other Ambulatory Visit: Payer: Self-pay

## 2022-10-04 ENCOUNTER — Ambulatory Visit (HOSPITAL_COMMUNITY): Payer: Medicare HMO

## 2022-10-04 ENCOUNTER — Ambulatory Visit (HOSPITAL_COMMUNITY): Payer: Medicare HMO | Admitting: Anesthesiology

## 2022-10-04 ENCOUNTER — Encounter (HOSPITAL_COMMUNITY): Payer: Self-pay | Admitting: Emergency Medicine

## 2022-10-04 ENCOUNTER — Encounter (HOSPITAL_COMMUNITY)
Admission: RE | Disposition: A | Discharge status: Home / Self Care | Payer: Self-pay | Source: Home / Self Care | Attending: Emergency Medicine

## 2022-10-04 ENCOUNTER — Ambulatory Visit (HOSPITAL_COMMUNITY)
Admission: RE | Admit: 2022-10-04 | Discharge: 2022-10-04 | Disposition: A | Discharge status: Home / Self Care | Payer: Medicare HMO | Attending: Emergency Medicine | Admitting: Emergency Medicine

## 2022-10-04 DIAGNOSIS — I10 Essential (primary) hypertension: Secondary | ICD-10-CM | POA: Insufficient documentation

## 2022-10-04 DIAGNOSIS — F17210 Nicotine dependence, cigarettes, uncomplicated: Secondary | ICD-10-CM

## 2022-10-04 DIAGNOSIS — R911 Solitary pulmonary nodule: Secondary | ICD-10-CM | POA: Diagnosis not present

## 2022-10-04 DIAGNOSIS — R590 Localized enlarged lymph nodes: Secondary | ICD-10-CM

## 2022-10-04 DIAGNOSIS — J449 Chronic obstructive pulmonary disease, unspecified: Secondary | ICD-10-CM | POA: Diagnosis not present

## 2022-10-04 DIAGNOSIS — J9811 Atelectasis: Secondary | ICD-10-CM | POA: Diagnosis not present

## 2022-10-04 DIAGNOSIS — C771 Secondary and unspecified malignant neoplasm of intrathoracic lymph nodes: Secondary | ICD-10-CM | POA: Diagnosis not present

## 2022-10-04 DIAGNOSIS — J432 Centrilobular emphysema: Secondary | ICD-10-CM | POA: Insufficient documentation

## 2022-10-04 DIAGNOSIS — R918 Other nonspecific abnormal finding of lung field: Secondary | ICD-10-CM | POA: Diagnosis present

## 2022-10-04 DIAGNOSIS — Z48813 Encounter for surgical aftercare following surgery on the respiratory system: Secondary | ICD-10-CM | POA: Diagnosis not present

## 2022-10-04 HISTORY — PX: BRONCHIAL WASHINGS: SHX5105

## 2022-10-04 HISTORY — PX: BRONCHIAL NEEDLE ASPIRATION BIOPSY: SHX5106

## 2022-10-04 HISTORY — PX: FIDUCIAL MARKER PLACEMENT: SHX6858

## 2022-10-04 HISTORY — PX: VIDEO BRONCHOSCOPY WITH ENDOBRONCHIAL ULTRASOUND: SHX6177

## 2022-10-04 HISTORY — PX: BRONCHIAL BIOPSY: SHX5109

## 2022-10-04 HISTORY — PX: BRONCHIAL BRUSHINGS: SHX5108

## 2022-10-04 LAB — BASIC METABOLIC PANEL
Anion gap: 10 (ref 5–15)
BUN: 14 mg/dL (ref 8–23)
CO2: 23 mmol/L (ref 22–32)
Calcium: 9.4 mg/dL (ref 8.9–10.3)
Chloride: 106 mmol/L (ref 98–111)
Creatinine, Ser: 0.7 mg/dL (ref 0.44–1.00)
GFR, Estimated: >60 mL/min (ref >60)
Glucose, Bld: 105 mg/dL — ABNORMAL HIGH (ref 70–99)
Potassium: 4 mmol/L (ref 3.5–5.1)
Sodium: 139 mmol/L (ref 135–145)

## 2022-10-04 LAB — CBC
HCT: 44.7 % (ref 36.0–46.0)
Hemoglobin: 14.6 g/dL (ref 12.0–15.0)
MCH: 33 pg (ref 26.0–34.0)
MCHC: 32.7 g/dL (ref 30.0–36.0)
MCV: 101.1 fL — ABNORMAL HIGH (ref 80.0–100.0)
Platelets: 299 10*3/uL (ref 150–400)
RBC: 4.42 MIL/uL (ref 3.87–5.11)
RDW: 13.7 % (ref 11.5–15.5)
WBC: 7.3 10*3/uL (ref 4.0–10.5)
nRBC: 0 % (ref 0.0–0.2)

## 2022-10-04 SURGERY — BRONCHOSCOPY, WITH BIOPSY USING ELECTROMAGNETIC NAVIGATION
Anesthesia: General

## 2022-10-04 MED ORDER — DEXAMETHASONE SODIUM PHOSPHATE 10 MG/ML IJ SOLN
INTRAMUSCULAR | Status: DC | PRN
  Administered 2022-10-04: 10 mg via INTRAVENOUS

## 2022-10-04 MED ORDER — ONDANSETRON HCL 4 MG/2ML IJ SOLN
INTRAMUSCULAR | Status: DC | PRN
  Administered 2022-10-04: 4 mg via INTRAVENOUS

## 2022-10-04 MED ORDER — PROPOFOL 10 MG/ML IV BOLUS
INTRAVENOUS | Status: DC | PRN
  Administered 2022-10-04: 140 mg via INTRAVENOUS

## 2022-10-04 MED ORDER — ACETAMINOPHEN 10 MG/ML IV SOLN: 1000.0000 mg | Freq: Once | INTRAVENOUS | Status: DC | PRN

## 2022-10-04 MED ORDER — LIDOCAINE 2% (20 MG/ML) 5 ML SYRINGE
INTRAMUSCULAR | Status: DC | PRN
  Administered 2022-10-04: 60 mg via INTRAVENOUS

## 2022-10-04 MED ORDER — CHLORHEXIDINE GLUCONATE 0.12 % MT SOLN
15.0000 mL | Freq: Once | OROMUCOSAL | Status: AC
  Administered 2022-10-04: 15 mL via OROMUCOSAL

## 2022-10-04 MED ORDER — LACTATED RINGERS IV SOLN: INTRAVENOUS | Status: DC

## 2022-10-04 MED ORDER — FENTANYL CITRATE (PF) 100 MCG/2ML IJ SOLN: 25.0000 ug | INTRAMUSCULAR | Status: DC | PRN

## 2022-10-04 MED ORDER — ROCURONIUM BROMIDE 10 MG/ML (PF) SYRINGE
PREFILLED_SYRINGE | INTRAVENOUS | Status: DC | PRN
  Administered 2022-10-04: 10 mg via INTRAVENOUS
  Administered 2022-10-04: 50 mg via INTRAVENOUS
  Administered 2022-10-04: 10 mg via INTRAVENOUS

## 2022-10-04 MED ORDER — PHENYLEPHRINE 80 MCG/ML (10ML) SYRINGE FOR IV PUSH (FOR BLOOD PRESSURE SUPPORT)
PREFILLED_SYRINGE | INTRAVENOUS | Status: DC | PRN
  Administered 2022-10-04 (×8): 80 ug via INTRAVENOUS

## 2022-10-04 MED ORDER — CHLORHEXIDINE GLUCONATE 0.12 % MT SOLN
OROMUCOSAL | Status: AC
  Filled 2022-10-04: qty 15

## 2022-10-04 SURGICAL SUPPLY — 1 items: superlock fiducial IMPLANT

## 2022-10-04 NOTE — Anesthesia Procedure Notes (Addendum)
Procedure Name: Intubation Date/Time: 10/04/2022 1:11 AM  Performed by: Stanton Kidney, CRNAPre-anesthesia Checklist: Patient identified, Emergency Drugs available, Suction available and Patient being monitored Patient Re-evaluated:Patient Re-evaluated prior to induction Oxygen Delivery Method: Circle system utilized Preoxygenation: Pre-oxygenation with 100% oxygen Induction Type: IV induction Ventilation: Mask ventilation without difficulty Laryngoscope Size: Miller and 3 Grade View: Grade I Tube type: Oral Tube size: 8.5 mm Number of attempts: 1 Airway Equipment and Method: Stylet and Oral airway Placement Confirmation: ETT inserted through vocal cords under direct vision, positive ETCO2 and breath sounds checked- equal and bilateral Secured at: 21 cm Tube secured with: Tape Dental Injury: Teeth and Oropharynx as per pre-operative assessment

## 2022-10-04 NOTE — Transfer of Care (Signed)
Immediate Anesthesia Transfer of Care Note  Patient: Virginia Powell  Procedure(s) Performed: ROBOTIC ASSISTED NAVIGATIONAL BRONCHOSCOPY VIDEO BRONCHOSCOPY WITH ENDOBRONCHIAL ULTRASOUND BRONCHIAL NEEDLE ASPIRATION BIOPSIES BRONCHIAL BRUSHINGS BRONCHIAL BIOPSIES BRONCHIAL WASHINGS FIDUCIAL MARKER PLACEMENT  Patient Location: PACU  Anesthesia Type:General  Level of Consciousness: awake, alert , and oriented  Airway & Oxygen Therapy: Patient Spontanous Breathing  Post-op Assessment: Report given to RN and Post -op Vital signs reviewed and stable  Post vital signs: Reviewed and stable  Last Vitals:  Vitals Value Taken Time  BP 122/63 10/04/22 1250  Temp 36.8 C 10/04/22 1250  Pulse 92 10/04/22 1256  Resp 19 10/04/22 1256  SpO2 94 % 10/04/22 1256  Vitals shown include unfiled device data.  Last Pain:  Vitals:   10/04/22 1250  TempSrc:   PainSc: 0-No pain      Patients Stated Pain Goal: 0 (10/04/22 0828)  Complications: No notable events documented.

## 2022-10-04 NOTE — Anesthesia Preprocedure Evaluation (Signed)
Anesthesia Evaluation  Patient identified by MRN, date of birth, ID band Patient awake    Reviewed: Allergy & Precautions, NPO status , Patient's Chart, lab work & pertinent test results  History of Anesthesia Complications (+) PONV and history of anesthetic complications  Airway Mallampati: II  TM Distance: >3 FB Neck ROM: Full    Dental no notable dental hx.    Pulmonary COPD, Current Smoker and Patient abstained from smoking.   Pulmonary exam normal        Cardiovascular hypertension, Pt. on medications  Rhythm:Regular Rate:Normal    1. Left ventricular ejection fraction, by estimation, is 60 to 65%. The left ventricle has normal function. The left ventricle has no regional wall motion abnormalities. Left ventricular diastolic parameters are consistent with Grade I diastolic dysfunction (impaired relaxation).  2. Right ventricular systolic function is normal. The right ventricular size is normal. Tricuspid regurgitation signal is inadequate for assessing PA pressure.  3. The mitral valve is normal in structure. Trivial mitral valve regurgitation. No evidence of mitral stenosis.  4. The aortic valve is tricuspid. Aortic valve regurgitation is not visualized. No aortic stenosis is present.  5. The inferior vena cava is normal in size with greater than 50% respiratory variability, suggesting right atrial pressure of 3 mmHg.    Neuro/Psych  Headaches  negative psych ROS   GI/Hepatic negative GI ROS, Neg liver ROS,,,  Endo/Other  negative endocrine ROS    Renal/GU negative Renal ROS  negative genitourinary   Musculoskeletal   Abdominal Normal abdominal exam  (+)   Peds  Hematology Lab Results      Component                Value               Date                      WBC                      7.3                 10/04/2022                HGB                      14.6                10/04/2022                HCT                       44.7                10/04/2022                MCV                      101.1 (H)           10/04/2022                PLT                      299                 10/04/2022             Lab Results  Component                Value               Date                      NA                       139                 10/04/2022                K                        4.0                 10/04/2022                CO2                      23                  10/04/2022                GLUCOSE                  105 (H)             10/04/2022                BUN                      14                  10/04/2022                CREATININE               0.70                10/04/2022                CALCIUM                  9.4                 10/04/2022                GFRNONAA                 >60                 10/04/2022              Anesthesia Other Findings   Reproductive/Obstetrics                             Anesthesia Physical Anesthesia Plan  ASA: 3  Anesthesia Plan: General   Post-op Pain Management:    Induction: Intravenous  PONV Risk Score and Plan: 3 and Ondansetron, Dexamethasone and Treatment may vary due to age or medical condition  Airway Management Planned: Mask and Oral ETT  Additional Equipment: None  Intra-op Plan:   Post-operative Plan: Extubation in OR  Informed Consent: I have reviewed the patients History and Physical, chart, labs and discussed the procedure including the risks, benefits and alternatives for the proposed anesthesia with the patient or  authorized representative who has indicated his/her understanding and acceptance.     Dental advisory given  Plan Discussed with: CRNA  Anesthesia Plan Comments:        Anesthesia Quick Evaluation

## 2022-10-04 NOTE — Interval H&P Note (Signed)
History and Physical Interval Note:  10/04/2022 9:23 AM  Virginia Powell  has presented today for surgery, with the diagnosis of right hilar adenopathy, bilateral pulmomary nodules.  The various methods of treatment have been discussed with the patient and family. After consideration of risks, benefits and other options for treatment, the patient has consented to  Procedure(s): ROBOTIC ASSISTED NAVIGATIONAL BRONCHOSCOPY (N/A) VIDEO BRONCHOSCOPY WITH ENDOBRONCHIAL ULTRASOUND (N/A) as a surgical intervention.  The patient's history has been reviewed, patient examined, no change in status, stable for surgery.  I have reviewed the patient's chart and labs.  Questions were answered to the patient's satisfaction.     Leslye Peer

## 2022-10-04 NOTE — Discharge Instructions (Addendum)
Flexible Bronchoscopy, Care After This sheet gives you information about how to care for yourself after your test. Your doctor may also give you more specific instructions. If you have problems or questions, contact your doctor. Follow these instructions at home: Eating and drinking When your numbness is gone and your cough and gag reflexes have come back, you may: Eat only soft foods. Slowly drink liquids. The day after the test, go back to your normal diet. Driving Do not drive for 24 hours if you were given a medicine to help you relax (sedative). Do not drive or use heavy machinery while taking prescription pain medicine. General instructions  Take over-the-counter and prescription medicines only as told by your doctor. Return to your normal activities as told. Ask what activities are safe for you. Do not use any products that have nicotine or tobacco in them. This includes cigarettes and e-cigarettes. If you need help quitting, ask your doctor. Keep all follow-up visits as told by your doctor. This is important. It is very important if you had a tissue sample (biopsy) taken. Get help right away if: You have shortness of breath that gets worse. You get light-headed. You feel like you are going to pass out (faint). You have chest pain. You cough up: More than a little blood. More blood than before. Summary Do not eat or drink anything (not even water) for 2 hours after your test, or until your numbing medicine wears off. Do not use cigarettes. Do not use e-cigarettes. Get help right away if you have chest pain.  Please call our office for any questions or concerns.  [phone].  This information is not intended to replace advice given to you by your health care provider. Make sure you discuss any questions you have with your health care provider. Document Released: 11/29/2008 Document Revised: 01/14/2017 Document Reviewed: 02/20/2016 Elsevier Patient Education  2020 Elsevier  Inc.  

## 2022-10-04 NOTE — Anesthesia Postprocedure Evaluation (Signed)
Anesthesia Post Note  Patient: Jakyrah Matthews Minervini  Procedure(s) Performed: ROBOTIC ASSISTED NAVIGATIONAL BRONCHOSCOPY VIDEO BRONCHOSCOPY WITH ENDOBRONCHIAL ULTRASOUND BRONCHIAL NEEDLE ASPIRATION BIOPSIES BRONCHIAL BRUSHINGS BRONCHIAL BIOPSIES BRONCHIAL WASHINGS FIDUCIAL MARKER PLACEMENT     Patient location during evaluation: PACU Anesthesia Type: General Level of consciousness: awake and alert Pain management: pain level controlled Vital Signs Assessment: post-procedure vital signs reviewed and stable Respiratory status: spontaneous breathing, nonlabored ventilation, respiratory function stable and patient connected to nasal cannula oxygen Cardiovascular status: blood pressure returned to baseline and stable Postop Assessment: no apparent nausea or vomiting Anesthetic complications: no   No notable events documented.  Last Vitals:  Vitals:   10/04/22 1345 10/04/22 1400  BP: 124/74 130/81  Pulse: 81 82  Resp: 20 (!) 21  Temp:    SpO2: 95% 95%    Last Pain:  Vitals:   10/04/22 1315  TempSrc:   PainSc: 0-No pain                 Earl Lites P [place]

## 2022-10-04 NOTE — Op Note (Signed)
Video Bronchoscopy with Robotic Assisted Bronchoscopic Navigation and Endobronchial Ultrasound Procedure Note  Date of Operation: 10/04/2022   Pre-op Diagnosis: Bilateral lower lobe pulmonary nodules, right mediastinal adenopathy  Post-op Diagnosis: Same  Surgeon: Levy Pupa  Assistants: None  Anesthesia: General endotracheal anesthesia  Operation: Flexible video fiberoptic bronchoscopy with robotic assistance and biopsies.  Estimated Blood Loss: Minimal  Complications: None  Indications and History: Virginia Powell is a 72 y.o. female with history of tobacco abuse, psoriasis.  She was found to have an enlarging right lower lobe pulmonary nodule, new left lower lobe pulmonary nodule on cancer screening imaging.  Also with some right mediastinal and hilar adenopathy.  Recommendation made to achieve a tissue diagnosis via robotic assisted navigational bronchoscopy and endobronchial ultrasound.  The risks, benefits, complications, treatment options and expected outcomes were discussed with the patient.  The possibilities of pneumothorax, pneumonia, reaction to medication, pulmonary aspiration, perforation of a viscus, bleeding, failure to diagnose a condition and creating a complication requiring transfusion or operation were discussed with the patient who freely signed the consent.    Description of Procedure: The patient was seen in the Preoperative Area, was examined and was deemed appropriate to proceed.  The patient was taken to [facility] endoscopy room 3, identified as Virginia Powell and the procedure verified as Flexible Video Fiberoptic Bronchoscopy.  A Time Out was held and the above information confirmed.   Prior to the date of the procedure a high-resolution CT scan of the chest was performed. Utilizing ION software program a virtual tracheobronchial tree was generated to allow the creation of distinct navigation pathways to the patient's parenchymal abnormalities. After  being taken to the operating room general anesthesia was initiated and the patient  was orally intubated. The video fiberoptic bronchoscope was introduced via the endotracheal tube and a general inspection was performed which showed normal right and left lung anatomy. Aspiration of the bilateral mainstems was completed to remove any remaining secretions. Robotic catheter inserted into patient's endotracheal tube.   Target #1 left lower lobe pulmonary nodule: The distinct navigation pathways prepared prior to this procedure were then utilized to navigate to patient's lesion identified on CT scan. The robotic catheter was secured into place and the vision probe was withdrawn.  Lesion location was approximated using fluoroscopy and local registration and targeting were performed using Cios three-dimensional imaging.  Needle-in-lesion was confirmed using Cios. Under fluoroscopic guidance transbronchial needle brushings, transbronchial needle biopsies, and transbronchial forceps biopsies were performed to be sent for cytology and pathology. A bronchioalveolar lavage was performed in the left lower lobe and sent for microbiology.  Under fluoroscopic guidance a single fiducial marker was placed adjacent to the nodule  Target #2 right lower lobe pulmonary nodule: The distinct navigation pathways prepared prior to this procedure were then utilized to navigate to patient's lesion identified on CT scan. The robotic catheter was secured into place and the vision probe was withdrawn.  Lesion location was approximated using fluoroscopy.  Local registration and targeting was performed using Cios three-dimensional imaging. Under fluoroscopic guidance transbronchial needle biopsies, and transbronchial forceps biopsies were performed to be sent for cytology and pathology.  Under fluoroscopic guidance a single fiducial marker was placed adjacent to the nodule.  The robotic scope was then withdrawn and the endobronchial  ultrasound was used to identify and characterize the peritracheal, hilar and bronchial lymph nodes. Inspection showed a normal 4L node, slight irregularity of the 4R node and a large 11R node that was easily accessible.  Using real-time ultrasound guidance Wang needle biopsies were take from Station 11R node and were sent for cytology.   At the end of the procedure a general airway inspection was performed and there was no evidence of active bleeding. The bronchoscope was removed.  The patient tolerated the procedure well. There was no significant blood loss and there were no obvious complications. A post-procedural chest x-ray is pending.  Samples Target #1: 1. Transbronchial needle brushings from left lower lobe nodule 2. Transbronchial Wang needle biopsies from left lower lobe nodule 3. Transbronchial forceps biopsies from left lower lobe nodule 4. Bronchoalveolar lavage from left lower lobe  Samples Target #2: 1. Transbronchial Wang needle biopsies from right lower lobe nodule 2. Transbronchial forceps biopsies from right lower lobe nodule  EBUS samples: 1. Wang needle biopsies from 11 R node   Plans:  The patient will be discharged from the PACU to home when recovered from anesthesia and after chest x-ray is reviewed. We will review the cytology, pathology and microbiology results with the patient when they become available. Outpatient followup will be with Dr. Delton Coombes.    Levy Pupa, MD, PhD 10/04/2022, 12:49 PM [facility] [phone] or if no answer before 7:00PM call [phone] For any issues after 7:00PM please call eLink [phone]

## 2022-10-04 NOTE — Research (Signed)
Title: A multi-center, prospective, single-arm, observational study to evaluate real-world outcomes for the shape-sensing Ion endoluminal system  Primary Outcome: Evaluate procedure characteristics and short and long-term patient outcomes following shape-sensing robotic-assisted bronchoscopy (ssRAB) utilizing the Ion Endoluminal System for lung lesion localization or biopsy.   Protocol # / Study Name: ISI-ION-003 Clinical Trials #: [id number] Sponsor: [employer] Principal Investigator: Dr. Elige Radon Icard  Key Features of Ion Endoluminal System (referred to as "Ion") Ion is the first FDA cleared bronchoscopy system that uses fiber optic shape sensing technology to inform on location within the airways. Its catheter/tool channel has a smaller outer diameter (3.5 mm) in comparison to conventional bronchoscopes, allowing it to navigate into the smaller airways of the periphery.     Key Inclusion Criteria Subject is 18 years or older at the time of the procedure Subject is a candidate for a planned, elective RAB lung lesion localization or biopsy procedure in which the Ion Endoluminal System is planned to be utilized.  Subject  able to understand and adhere to study requirements and provide informed consent.   Key Exclusion Criteria Subject is under the care of a Museum/gallery exhibitions officer and is unable to provide informed consent on their own accord.  Subject is participating in an interventional research study or research study with investigational agents with an unknown safety profile that would interfere with participation or the results of this study.  Female subjects who are pregnant or nursing at the time of the index Ion procedure, as determined by standard site practices. Subjects that are incarcerated or institutionalized under court order, or other vulnerable populations.    Previous Clinical Trials Since receiving FDA clearance in Feb 2019, Ion has been adopted  commercially by over 226 centers in the [place], and utilized in over 40,000 procedures.  The first in-human study enrolled 44 subjects with a mean lesion size of 14.8 mm and the overall diagnostic yield was 79.3%, with no adverse events. 17 (58.6%) lesions were reported to have a bronchus sign available on CT imaging.  A multi-center study published results in 2022, with 270 lesions biopsied in 241 patients using Ion. The mean largest cardinal lesion size was 18.86.14mm, and the mean airway generation count was 7.01.6. Asymptomatic pneumothorax occurred in 3.3% of subjects, and 0.8% experienced airway bleeding.   Another study provided preliminary results in 2022, with 87% sensitivity for malignancy, a diagnostic yield of 81%, and a mean lesion size of 16 mm. 75% of biopsy cases were bronchus-sign negative. 4% of subjects experienced pneumothoraces (including those requiring intervention), and 0.8% of subjects experienced airway bleeding requiring wedging or balloon tamponade.  A single-center study captured 131 consecutive procedures of pulmonary biopsy using Ion. The navigational success rate was 98.7%, with an overall diagnostic yield of 81.7%, an overall complication rate of 3%, and a pneumothorax rate of 1.5%.   [facility] Clinical Research Coordinator note:   This visit for Subject Virginia Powell with DOB: November 09, 1950 on 10/04/2022 for the above protocol is Visit/Encounter # Pre-procedure, Intra-procedure and Post-procedure, and is for purpose of research.   The consent for this encounter is under:  Protocol Version 1.0 Investigator Brochure Version N/A Consent Version Revision A, dated 14Nov2023 and is currently IRB approved.   Virginia Powell expressed continued interest and consent in continuing as a study subject. Subject confirmed that there was no change in contact information (e.g. address, telephone, email). Subject thanked for participation in research and  contribution to science. In this  visit 10/04/2022 the subject will be evaluated by Sub-Investigator named Dr. Delton Coombes. This research coordinator has verified that the above investigator is up to date with his/her training logs.   The Subject was informed that the PI continues to have oversight of the subject's visits and course through relevant discussions, reviews, and also specifically of this visit by routing of this note to the PI.  The research study was discussed with the subject in the pre-operative room. The study was explained in detail including all the contents of the informed consent document. The subject was encouraged to ask questions. All questions were answered to their satisfaction. The IRB approved informed consent was signed, and a copy was given to the subject. After obtaining consent, the subject underwent scheduled procedure using the ion endoluminal system. Data collection was completed per protocol. Refer to paper source subject binder for further details.      Signed by  Verdene Lennert Clinical Research Coordinator / Sub-Investigator  [facility]  [place], [place] 12:44 PM 10/04/2022

## 2022-10-05 ENCOUNTER — Telehealth: Payer: Self-pay | Admitting: Emergency Medicine

## 2022-10-05 LAB — CYTOLOGY - NON PAP

## 2022-10-05 NOTE — Telephone Encounter (Signed)
Discussed bronchoscopy results with the patient.  Her left lower lobe biopsy showed granulomatous inflammation.  The cultures are all still pending.  The right lower lobe nodule was unremarkable, no evidence of malignancy in either location.  We will follow-up culture data with her in office.

## 2022-10-06 LAB — CULTURE, BAL-QUANTITATIVE W GRAM STAIN: Culture: NO GROWTH

## 2022-10-06 LAB — CYTOLOGY - NON PAP

## 2022-10-08 ENCOUNTER — Encounter (HOSPITAL_COMMUNITY): Payer: Self-pay | Admitting: Emergency Medicine

## 2022-10-08 LAB — ACID FAST SMEAR (AFB, MYCOBACTERIA): Acid Fast Smear: NEGATIVE

## 2022-10-09 LAB — AEROBIC/ANAEROBIC CULTURE W GRAM STAIN (SURGICAL/DEEP WOUND): Culture: NO GROWTH

## 2022-10-11 DIAGNOSIS — R202 Paresthesia of skin: Secondary | ICD-10-CM | POA: Diagnosis not present

## 2022-10-11 DIAGNOSIS — M79652 Pain in left thigh: Secondary | ICD-10-CM | POA: Diagnosis not present

## 2022-10-11 DIAGNOSIS — F17210 Nicotine dependence, cigarettes, uncomplicated: Secondary | ICD-10-CM | POA: Diagnosis not present

## 2022-10-11 DIAGNOSIS — R030 Elevated blood-pressure reading, without diagnosis of hypertension: Secondary | ICD-10-CM | POA: Diagnosis not present

## 2022-10-11 DIAGNOSIS — Z6820 Body mass index (BMI) 20.0-20.9, adult: Secondary | ICD-10-CM | POA: Diagnosis not present

## 2022-10-20 DIAGNOSIS — M79652 Pain in left thigh: Secondary | ICD-10-CM | POA: Diagnosis not present

## 2022-10-20 DIAGNOSIS — M71352 Other bursal cyst, left hip: Secondary | ICD-10-CM | POA: Diagnosis not present

## 2022-10-20 DIAGNOSIS — Z6820 Body mass index (BMI) 20.0-20.9, adult: Secondary | ICD-10-CM | POA: Diagnosis not present

## 2022-10-20 DIAGNOSIS — S39012A Strain of muscle, fascia and tendon of lower back, initial encounter: Secondary | ICD-10-CM | POA: Diagnosis not present

## 2022-10-20 DIAGNOSIS — F17210 Nicotine dependence, cigarettes, uncomplicated: Secondary | ICD-10-CM | POA: Diagnosis not present

## 2022-10-20 DIAGNOSIS — R202 Paresthesia of skin: Secondary | ICD-10-CM | POA: Diagnosis not present

## 2022-10-20 DIAGNOSIS — R030 Elevated blood-pressure reading, without diagnosis of hypertension: Secondary | ICD-10-CM | POA: Diagnosis not present

## 2022-10-22 ENCOUNTER — Ambulatory Visit: Payer: Medicare HMO | Admitting: Acute Care

## 2022-10-27 DIAGNOSIS — H40023 Open angle with borderline findings, high risk, bilateral: Secondary | ICD-10-CM | POA: Diagnosis not present

## 2022-10-27 DIAGNOSIS — Z961 Presence of intraocular lens: Secondary | ICD-10-CM | POA: Diagnosis not present

## 2022-10-27 DIAGNOSIS — H5213 Myopia, bilateral: Secondary | ICD-10-CM | POA: Diagnosis not present

## 2022-11-07 LAB — FUNGUS CULTURE RESULT

## 2022-11-07 LAB — FUNGUS CULTURE WITH STAIN

## 2022-11-07 LAB — FUNGAL ORGANISM REFLEX

## 2022-11-09 ENCOUNTER — Encounter: Payer: Self-pay | Admitting: Acute Care

## 2022-11-09 ENCOUNTER — Ambulatory Visit: Payer: Medicare HMO | Admitting: Acute Care

## 2022-11-09 ENCOUNTER — Telehealth: Payer: Self-pay | Admitting: Acute Care

## 2022-11-09 VITALS — BP 120/80 | HR 87 | Temp 98.0°F | Ht 63.0 in | Wt 113.8 lb

## 2022-11-09 DIAGNOSIS — F17200 Nicotine dependence, unspecified, uncomplicated: Secondary | ICD-10-CM

## 2022-11-09 DIAGNOSIS — C3490 Malignant neoplasm of unspecified part of unspecified bronchus or lung: Secondary | ICD-10-CM | POA: Diagnosis not present

## 2022-11-09 DIAGNOSIS — Z9889 Other specified postprocedural states: Secondary | ICD-10-CM

## 2022-11-09 NOTE — Patient Instructions (Addendum)
It is good to see you today. 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 . I have referred you to the cancer center at [facility] for consultation and treatment. You will get a call to get this scheduled either today or tomorrow.  Please work on quitting smoking. I know this is hard to do, but will benefit you in the long run. You can call 1-800-QUIT NOW for free nicotine patches, gum or mints. Daisey Must with your treatment , the [facility] will take great care of you.  Please let us know if there is anything we can do to help. Please contact office for sooner follow up if symptoms do not improve or worsen or seek emergency care

## 2022-11-09 NOTE — Progress Notes (Signed)
History of Present Illness Virginia Powell is a 72 y.o. female current every day smoker followed through the lung cancer screening program. Her baseline scan done 08/04/2022 was read as a LR 4B. She will be followed for this finding by Dr. Delton Coombes.    Synopsis 72 year old female current every day smoker followed through the lung cancer screening program. Initial baseline scan done August 04, 2022 was read as a lung RADS 4B, there was notation of a growing solid nodule in the right lower lobe that measured 8 mm and a new irregular solid nodule of the left lower lobe that measured 9.3 mm. Patient was notified of the findings and a PET scan was done 09/09/2022 to better evaluate the nodules of concern.  The PET scan showed a hypermetabolic right hilar lymph node as well as bilateral pulmonary nodules that were concerning for synchronous primary bronchogenic carcinomas.  Patient was then scheduled to see Dr. Levy Pupa 09/15/2022 to be evaluated for possible bronchoscopy with biopsy.  He reviewed the results of her CT scan and her PET scan and explained that the nodules and the lymph nodes were suspicious for possible lung cancer.  The patient wanted to discuss bronchoscopy and biopsies with her husband and was instructed to call the office to let us know if she chose to undergo the procedure.  She did call the office back and a bronchoscopy with biopsies was scheduled for October 04, 2022.  Patient underwent Flexible video fiberoptic bronchoscopy with robotic assistance and biopsies, and EBUS.  She is here today for follow-up after procedure and to review cytology results.   11/09/2022 Pt. Presents for follow up after Flexible video fiberoptic bronchoscopy with robotic assistance and biopsies on 10/04/2022. She had biopsy for Bilateral lower lobe pulmonary nodules, right mediastinal adenopathy concerning for synchronous primary bronchogenic carcinomas.. She states she has done well after the biopsy. She did  have some small specks in her sputum for a day or so and then it self resolved. No fever, discolored secretions or breathing issues since the procedure.  We have discussed her cytology results . We discussed that there was a lymph node that resulted as positive for small cell lung cancer .  I have reviewed the options for treatment . I  will refer her to  medical oncology for consultation and evaluation for treatment of this finding of small cell lung cancer.  I have reviewed her pulmonary function tests which do look adequate for surgery however with lymph node involvement I will start with referral to medical oncology for evaluation. I have made an urgent referral so that we can get patient into be evaluated as soon as possible. Patient verbalized understanding of the above.  She is still smoking, however is working diligently at quitting.  She prefers to try and do this cold [place] however I have also encouraged her to call the 1 800 quit NOW number for free nicotine replacement therapy if she needs it.  On the PET scan there was also notation of Vague thickening of the lateral right breast soft tissue (202/114), SUV max 1.7.  Recommendation by radiology was for a mammogram.  Looking back in the patient's medical record I do not see a mammogram for 2023, or 2024.  As the patient was quite shocked to find that her biopsies were positive for small cell cancer we failed to discuss this.  I have sent a message to Dr. Ellin Saba and I have asked that he discuss this  with her when he sees her for consultation regarding her small cell lung cancer.  I did attempt to call the patient after she left the office.  She did not answer either of the phones we have listed as contact for her.  Test Results: Cytology 10/04/2022   FINAL MICROSCOPIC DIAGNOSIS:  A. LUNG, LLL, FINE NEEDLE ASPIRATION:  - No malignant cells identified  - Granulomatous inflammation   B. LUNG, LLL, BRUSHING:  - No malignant cells identified   - Granulomatous inflammation   FINAL MICROSCOPIC DIAGNOSIS:  D. LUNG, RLL, FINE NEEDLE ASPIRATION:  - No malignant cells identified   FINAL MICROSCOPIC DIAGNOSIS:   E. LYMPH NODE, 11R, FINE NEEDLE ASPIRATION:  - Small cell carcinoma  - Lymphoid tissue present  - See comments   Super D CT Chest WO contrast 10/02/2022 No significant change in the appearance of tracer avid nodules within the medial right lower lobe and superior segment of left lower lobe. Stable appearance of tracer avid nodal conglomeration noted within the right hilum. Which is concerning for nodal metastasis. Stable appearance of bilateral adrenal adenomas. No follow-up imaging recommended. Coronary artery calcifications. Aortic Atherosclerosis (ICD10-I70.0) and Emphysema (ICD10-J43.9).  Personally reviewed by me  PET Scan 09/09/2022 Spiculated superior segment left lower lobe nodule measures 1.4 x 1.4 cm (203/38), SUV max 5.0. Right hilar adenopathy roughly measures 2.9 cm in short axis (202/96), SUV max 11.5. 9 mm nodule in the medial right lower lobe (203/88), SUV max 2.4. Vague thickening of the lateral right breast soft tissue (202/114), SUV max 1.7. No additional abnormal hypermetabolism.  Hypermetabolic right hilar adenopathy and bilateral pulmonary nodules, findings indicative of synchronous primary bronchogenic carcinomas. No evidence of distant metastatic disease. 2. Vague slight thickening of lateral breast soft tissue with metabolism just below blood pool. Consider mammographic correlation, as clinically indicated. 3. Slight marginal irregularity of the liver raises suspicion for cirrhosis. 4. Bilateral adrenal adenomas. 5.  Aortic atherosclerosis (ICD10-I70.0). 6.  Emphysema (ICD10-J43.9).  Personally reviewed by me  PFT's 03/09/2022              Latest Ref Rng & Units 10/04/2022    8:40 AM  CBC  WBC 4.0 - 10.5 K/uL 7.3   Hemoglobin 12.0 - 15.0 g/dL 46.9   Hematocrit 62.9 -  46.0 % 44.7   Platelets 150 - 400 K/uL 299        Latest Ref Rng & Units 10/04/2022    8:40 AM  BMP  Glucose 70 - 99 mg/dL 528   BUN 8 - 23 mg/dL 14   Creatinine 4.13 - 1.00 mg/dL 2.44   Sodium 010 - 272 mmol/L 139   Potassium 3.5 - 5.1 mmol/L 4.0   Chloride 98 - 111 mmol/L 106   CO2 22 - 32 mmol/L 23   Calcium 8.9 - 10.3 mg/dL 9.4     BNP No results found for: "BNP"  ProBNP No results found for: "PROBNP"  PFT    Component Value Date/Time   FEV1PRE 1.22 03/09/2022 1445   FEV1POST 1.20 03/09/2022 1445   FVCPRE 2.35 03/09/2022 1445   FVCPOST 2.28 03/09/2022 1445   TLC 5.63 03/09/2022 1445   DLCOUNC 10.30 03/09/2022 1445   PREFEV1FVCRT 52 03/09/2022 1445   PSTFEV1FVCRT 53 03/09/2022 1445    No results found.   Past medical hx Past Medical History:  Diagnosis Date   Complication of anesthesia    COPD (chronic obstructive pulmonary disease) (HCC)    Essential hypertension    Headache  Hyperlipidemia    Hypertension    Migraines    Pneumonia 04/2018   PONV (postoperative nausea and vomiting)    Psoriasis    Seasonal allergies    Smoker      Social History   Tobacco Use   Smoking status: Every Day    Current packs/day: 1.62    Average packs/day: 1.6 packs/day for 55.0 years (89.1 ttl pk-yrs)    Types: Cigarettes   Smokeless tobacco: Never   Tobacco comments:    Smoking 1-2 puffs.  Went 8 days without smoking.  Has not bought anymore.  11/09/2022 hfb  Vaping Use   Vaping status: Never Used  Substance Use Topics   Alcohol use: Yes    Comment: social   Drug use: Never    Ms.Steeber reports that she has been smoking cigarettes. She has a 89.1 pack-year smoking history. She has never used smokeless tobacco. She reports current alcohol use. She reports that she does not use drugs.  Tobacco Cessation: Current everyday smoker with an 89.1-pack-year smoking history. Counseled on smoking cessation for 3 to 4 minutes during today's visit  Past surgical  hx, Family hx, Social hx all reviewed.  Current Outpatient Medications on File Prior to Visit  Medication Sig   albuterol (PROVENTIL) (2.5 MG/3ML) 0.083% nebulizer solution SMARTSIG:1 Vial(s) Via Nebulizer Every 4-6 Hours PRN   albuterol (VENTOLIN HFA) 108 (90 Base) MCG/ACT inhaler SMARTSIG:2 Puff(s) By Mouth Every 4 Hours   atorvastatin (LIPITOR) 40 MG tablet Take 40 mg by mouth daily.   fexofenadine (ALLEGRA) 180 MG tablet Take 180 mg by mouth daily as needed for allergies or rhinitis.   fluticasone (FLONASE) 50 MCG/ACT nasal spray Place into both nostrils daily as needed.   hydrochlorothiazide (HYDRODIURIL) 12.5 MG tablet Take 12.5 mg by mouth daily.   hydrOXYzine (ATARAX) 25 MG tablet Take 1 tablet by mouth at bedtime.   olmesartan (BENICAR) 40 MG tablet Take 1 tablet (40 mg total) by mouth daily.   rizatriptan (MAXALT-MLT) 10 MG disintegrating tablet Take 10 mg by mouth as needed for migraine. May repeat in 2 hours if needed   TRELEGY ELLIPTA 100-62.5-25 MCG/ACT AEPB Inhale 1 puff into the lungs daily.   loratadine (CLARITIN) 10 MG tablet Take 10 mg by mouth daily. (Patient not taking: Reported on 11/09/2022)   No current facility-administered medications on file prior to visit.     Allergies  Allergen Reactions   Morphine And Codeine Nausea And Vomiting    Review Of Systems:  Constitutional:   No  weight loss, night sweats,  Fevers, chills, fatigue, or  lassitude.  HEENT:   No headaches,  Difficulty swallowing,  Tooth/dental problems, or  Sore throat,                No sneezing, itching, ear ache, nasal congestion, post nasal drip,   CV:  No chest pain,  Orthopnea, PND, swelling in lower extremities, anasarca, dizziness, palpitations, syncope.   GI  No heartburn, indigestion, abdominal pain, nausea, vomiting, diarrhea, change in bowel habits, loss of appetite, bloody stools.   Resp: No shortness of breath with exertion or at rest.  No excess mucus, no productive cough,  No  non-productive cough,  No coughing up of blood.  No change in color of mucus.  No wheezing.  No chest wall deformity  Skin: no rash or lesions.  GU: no dysuria, change in color of urine, no urgency or frequency.  No flank pain, no hematuria   MS:  No joint pain or swelling.  No decreased range of motion.  No back pain.  Psych:  No change in mood or affect. No depression or anxiety.  No memory loss.   Vital Signs BP 120/80 (BP Location: Left Arm, Patient Position: Sitting, Cuff Size: Normal)   Pulse 87   Temp 98 F (36.7 C) (Oral)   Ht 5\' 3"  (1.6 m)   Wt 113 lb 12.8 oz (51.6 kg)   SpO2 98%   BMI 20.16 kg/m    Physical Exam:  General- No distress,  A&Ox3, pleasant  ENT: No sinus tenderness, TM clear, pale nasal mucosa, no oral exudate,no post nasal drip, no LAN Cardiac: S1, S2, regular rate and rhythm, no murmur Chest: No wheeze/ rales/ dullness; no accessory muscle use, no nasal flaring, no sternal retractions, distant and slightly diminished per bases Abd.: Soft Non-tender, ND, BS +, Body mass index is 20.16 kg/m.  Ext: No clubbing cyanosis, No edema Neuro:  normal strength, MAE x 4, A&O x3 Skin: No rashes, warm and dry, bo lesions  Psych: normal mood and behavior   Assessment/Plan New diagnosis small cell lung cancer in a current every day smoker. Positive  #11 R node Plan One of the lung biopsies  was positive for a small cell lung cancer in one of the lymph nodes in your chest. . I have referred you to the cancer center at [facility] for consultation and treatment. You will get a call to get this scheduled either today or tomorrow.  Please work on quitting smoking. I know this is hard to do, but will benefit you in the long run. You can call 1-800-QUIT NOW for free nicotine patches, gum or mints. Daisey Must with your treatment , the [facility] will take great care of you.  Please let us know if there is anything we can do to help. Please contact office  for sooner follow up if symptoms do not improve or worsen or seek emergency care    I spent 40 minutes dedicated to the care of this patient on the date of this encounter to include pre-visit review of records, face-to-face time with the patient discussing conditions above, post visit ordering of testing, clinical documentation with the electronic health record, making appropriate referrals as documented, and communicating necessary information to the patient's healthcare team.   Bevelyn Ngo, NP 11/09/2022  4:34 PM

## 2022-11-11 ENCOUNTER — Inpatient Hospital Stay: Payer: Medicare HMO | Attending: Oncology | Admitting: Oncology

## 2022-11-11 ENCOUNTER — Inpatient Hospital Stay: Payer: Medicare HMO

## 2022-11-11 ENCOUNTER — Encounter: Payer: Self-pay | Admitting: Oncology

## 2022-11-11 VITALS — BP 138/85 | HR 109 | Temp 98.4°F | Resp 18 | Ht 64.0 in | Wt 112.4 lb

## 2022-11-11 DIAGNOSIS — Z23 Encounter for immunization: Secondary | ICD-10-CM

## 2022-11-11 DIAGNOSIS — F17210 Nicotine dependence, cigarettes, uncomplicated: Secondary | ICD-10-CM | POA: Insufficient documentation

## 2022-11-11 DIAGNOSIS — C7802 Secondary malignant neoplasm of left lung: Secondary | ICD-10-CM | POA: Diagnosis not present

## 2022-11-11 DIAGNOSIS — C7801 Secondary malignant neoplasm of right lung: Secondary | ICD-10-CM | POA: Diagnosis not present

## 2022-11-11 DIAGNOSIS — C3490 Malignant neoplasm of unspecified part of unspecified bronchus or lung: Secondary | ICD-10-CM | POA: Insufficient documentation

## 2022-11-11 MED ORDER — INFLUENZA VAC A&B SURF ANT ADJ 0.5 ML IM SUSY
0.5000 mL | PREFILLED_SYRINGE | Freq: Once | INTRAMUSCULAR | Status: AC
  Administered 2022-11-11: 0.5 mL via INTRAMUSCULAR

## 2022-11-11 NOTE — Assessment & Plan Note (Signed)
-  Oncology history as above -Will present her case at the lung tumor board to determine if the consensus is to treat this as a limited stage disease with 2 primaries or extensive stage disease. -Will obtain an MRI of head to rule out brain metastasis -Will have port placement after the tumor board discussion.  Discussed risk versus benefits briefly. -Will refer to radiation oncology -Will repeat a PET scan considering the last one was almost 2 months ago at this point.  Return to clinic in 2 weeks to discuss further management and prognosis

## 2022-11-11 NOTE — Progress Notes (Signed)
Flu vaccine given in right deltoid without incident.  Tolerated well.  Site CDI and band aid placed.  Discharged ambulatory in stable condition.

## 2022-11-11 NOTE — Assessment & Plan Note (Signed)
-  Patient is an avid smoker with history of smoking 2 packs/day until recently -Discussed the risks of smoking including faster progression of cancer, inflammation causing difficulties with chemotherapy and radiation -Offered help to quit smoking, patient is currently trying with nicotine gum.

## 2022-11-11 NOTE — Progress Notes (Signed)
Hematology-Oncology Clinic Note  Virginia Moris, PA-C   Reason for Referral: Small cell lung carcinoma  Oncology History: I have reviewed her chart and materials related to her cancer extensively and collaborated history with the patient. 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 Growing solid nodule of the right lower lobe measuring 8 mm and new irregular solid nodule of the left lower lobe measuring 9.3 mm. Lung-RADS 4B, suspicious. Additional imaging evaluation or consultation with Pulmonology or Thoracic Surgery recommended.   09/09/2022 PET scan   IMPRESSION: 1. Hypermetabolic right hilar adenopathy and bilateral pulmonary nodules, findings indicative of synchronous primary bronchogenic carcinomas. No evidence of distant metastatic disease. 2. Vague slight thickening of lateral breast soft tissue with metabolism just below blood pool. Consider mammographic correlation, as clinically indicated. 3. Slight marginal irregularity of the liver raises suspicion for cirrhosis. 4. Bilateral adrenal adenomas.   10/02/2022 Imaging   CT chest without contrast: IMPRESSION: 1. No significant change in the appearance of tracer avid nodules within the medial right lower lobe and superior segment of left lower lobe. 2. Stable appearance of tracer avid nodal conglomeration noted within the right hilum. Which is concerning for nodal metastasis. 3. Stable appearance of bilateral adrenal adenomas. No follow-up imaging recommended.   10/04/2022 Pathology Results   A. LUNG, LLL, FINE NEEDLE ASPIRATION:  - No malignant cells identified  - Granulomatous inflammation   B. LUNG, LLL, BRUSHING:  - No malignant cells identified  - Granulomatous inflammation D. LUNG, RLL, FINE NEEDLE ASPIRATION:  - No malignant cells identified   E. 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 female is is referred by pulmonology for newly diagnosed small cell lung cancer.  She has a past medical history of COPD and hypertension.  Today, she is accompanied by her husband and son.  Patient was of good health until she went for a screening CT scan which showed a suspicious nodule in the right lower lobe and a new nodule in the left lower lobe.  PET scan done after this showed evidence of the same which was followed by an EBUS with biopsy of both lesions along with lymph node sampling.  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  Patient reported doing well, has shortness of breath at baseline but stated that she has had it since COVID in 2019.  She also reports some numbness in her left thigh for which she is taking gabapentin, and some chest tightness.  She denies hearing difficulties, neuropathy of hands and feet, nausea, vomiting, loss of appetite, weight loss, fatigue.  Overall she is very active and has been doing well  She is an avid smoker, has been smoking since she was 18 up to 2 packs a day, recently cut down after that diagnosis of cancer.  Stated that she has not been buying cigarettes anymore but is smoking her husband cigarettes sometimes.  She drinks an occasional glass of wine.  Lives in [place] along with her husband and is currently retired.  We discussed that since she has 2 separate nodules along with lymph node involvement  this can be in an extensive stage small cell lung cancer but would like to present at the tumor board to see if the consensus is to treat this like a limited stage or extensive stage.  Briefly discussed the need of chemotherapy in either scenario with or without  radiation.  It is difficult to state prognosis without completion of staging.  All questions and concerns were answered.    Medical History: Past Medical History:  Diagnosis Date   Complication of anesthesia    COPD (chronic obstructive pulmonary disease) (HCC)    Essential hypertension    Headache    Hyperlipidemia    Hypertension    Migraines    Pneumonia 04/2018   PONV (postoperative nausea and vomiting)    Psoriasis    Seasonal allergies    Smoker     Surgical history: Past Surgical History:  Procedure Laterality Date   ABDOMINAL HYSTERECTOMY     BRONCHIAL BIOPSY  10/04/2022   Procedure: BRONCHIAL BIOPSIES;  Surgeon: Leslye Peer, MD;  Location: [facility] ENDOSCOPY;  Service: Pulmonary;;   BRONCHIAL BRUSHINGS  10/04/2022   Procedure: BRONCHIAL BRUSHINGS;  Surgeon: Leslye Peer, MD;  Location[facility] ENDOSCOPY;  Service: Pulmonary;;   BRONCHIAL NEEDLE ASPIRATION BIOPSY  10/04/2022   Procedure: BRONCHIAL NEEDLE ASPIRATION BIOPSIES;  Surgeon: Leslye Peer, MD;  Location: [facility] ENDOSCOPY;  Service: Pulmonary;;   BRONCHIAL WASHINGS  10/04/2022   Procedure: BRONCHIAL WASHINGS;  Surgeon: Leslye Peer, MD;  Location: [facility] ENDOSCOPY;  Service: Pulmonary;;   CATARACT EXTRACTION Bilateral    FIDUCIAL MARKER PLACEMENT  10/04/2022   Procedure: FIDUCIAL MARKER PLACEMENT;  Surgeon: Leslye Peer, MD;  Location: [facility] ENDOSCOPY;  Service: Pulmonary;;   PARTIAL HYSTERECTOMY     TUBAL LIGATION     VIDEO BRONCHOSCOPY WITH ENDOBRONCHIAL ULTRASOUND N/A 10/04/2022   Procedure: VIDEO BRONCHOSCOPY WITH ENDOBRONCHIAL ULTRASOUND;  Surgeon: Leslye Peer, MD;  Location: [facility] ENDOSCOPY;  Service: Pulmonary;  Laterality: N/A;    Social History: Social History   Socioeconomic History   Marital status: Married    Spouse name: Not on file   Number of children: Not on file   Years of education: Not on file   Highest education level: Not on file  Occupational History   Not on file  Tobacco Use   Smoking  status: Every Day    Current packs/day: 1.62    Average packs/day: 1.6 packs/day for 55.0 years (89.1 ttl pk-yrs)    Types: Cigarettes   Smokeless tobacco: Never   Tobacco comments:    Smoking 1-2 puffs.  Went 8 days without smoking.  Has not bought anymore.  11/09/2022 hfb  Vaping Use   Vaping status: Never Used  Substance and Sexual Activity   Alcohol use: Yes    Comment: social   Drug use: Never   Sexual activity: Not on file  Other Topics Concern   Not on file  Social History Narrative   ** Merged History Encounter **       Social Determinants of Health   Financial Resource Strain: Not on file  Food Insecurity: Not on file  Transportation Needs: Not on file  Physical Activity: Not on file  Stress: Not on file  Social Connections: Not on file  Intimate Partner Violence: Not on file    Family History: Family History  Problem Relation Age of Onset   Heart attack Brother    Hyperlipidemia Brother    Hyperlipidemia Mother     Allergies:  is  allergic to morphine and codeine.  Medications:  Current Outpatient Medications  Medication Sig Dispense Refill   albuterol (PROVENTIL) (2.5 MG/3ML) 0.083% nebulizer solution SMARTSIG:1 Vial(s) Via Nebulizer Every 4-6 Hours PRN     albuterol (VENTOLIN HFA) 108 (90 Base) MCG/ACT inhaler SMARTSIG:2 Puff(s) By Mouth Every 4 Hours     atorvastatin (LIPITOR) 40 MG tablet Take 40 mg by mouth daily.     fexofenadine (ALLEGRA) 180 MG tablet Take 180 mg by mouth daily as needed for allergies or rhinitis.     fluticasone (FLONASE) 50 MCG/ACT nasal spray Place into both nostrils daily as needed.     furosemide (LASIX) 20 MG tablet Take 20 mg by mouth daily.     gabapentin (NEURONTIN) 300 MG capsule Take 600 mg by mouth at bedtime.     hydrochlorothiazide (HYDRODIURIL) 12.5 MG tablet Take 12.5 mg by mouth daily.     hydrOXYzine (ATARAX) 25 MG tablet Take 1 tablet by mouth at bedtime.     loratadine (CLARITIN) 10 MG tablet Take 10 mg by mouth  daily.     olmesartan (BENICAR) 40 MG tablet Take 1 tablet (40 mg total) by mouth daily. 30 tablet 0   rizatriptan (MAXALT-MLT) 10 MG disintegrating tablet Take 10 mg by mouth as needed for migraine. May repeat in 2 hours if needed     TRELEGY ELLIPTA 100-62.5-25 MCG/ACT AEPB Inhale 1 puff into the lungs daily.     No current facility-administered medications for this visit.    Review of Systems: Constitutional: Denies fevers, chills or abnormal night sweats Eyes: Denies blurriness of vision, double vision or watery eyes Ears, nose, mouth, throat, and face: Denies mucositis or sore throat Respiratory: Denies cough, dyspnea or wheezes Cardiovascular: Denies palpitation, chest discomfort or lower extremity swelling Gastrointestinal:  Denies nausea, heartburn or change in bowel habits Skin: Denies abnormal skin rashes Lymphatics: Denies new lymphadenopathy or easy bruising Neurological:Denies numbness, tingling or new weaknesses Behavioral/Psych: Mood is stable, no new changes  All other systems were reviewed with the patient and are negative.  Physical Examination: ECOG PERFORMANCE STATUS: 0 - Asymptomatic  Vitals:   11/11/22 1414  BP: 138/85  Pulse: (!) 109  Resp: 18  Temp: 98.4 F (36.9 C)  SpO2: 99%   Filed Weights   11/11/22 1414  Weight: 112 lb 6.4 oz (51 kg)    GENERAL:alert, no distress and comfortable LUNGS: clear to auscultation and percussion with normal breathing effort HEART: regular rate & rhythm and no murmurs and no lower extremity edema ABDOMEN:abdomen soft, non-tender and normal bowel sounds Musculoskeletal:no cyanosis of digits and no clubbing  PSYCH: alert & oriented x 3 with fluent speech  Laboratory Data: I have reviewed the data as listed Lab Results  Component Value Date   WBC 7.3 10/04/2022   HGB 14.6 10/04/2022   HCT 44.7 10/04/2022   MCV 101.1 (H) 10/04/2022   PLT 299 10/04/2022   Recent Labs    10/04/22 0840  NA 139  K 4.0  CL 106   CO2 23  GLUCOSE 105*  BUN 14  CREATININE 0.70  CALCIUM 9.4  GFRNONAA >60    Radiographic Studies: I have personally reviewed the radiological images as listed and agreed with the findings in the report.  All the reports are above under ONC history   ASSESSMENT & PLAN:  Patient is a 73 year old female referred for newly diagnosed small cell lung cancer   Small cell carcinoma metastatic to both lungs [facility]) -Oncology history as above -  Will present her case at the lung tumor board to determine if the consensus is to treat this as a limited stage disease with 2 primaries or extensive stage disease. -Will obtain an MRI of head to rule out brain metastasis -Will have port placement after the tumor board discussion.  Discussed risk versus benefits briefly. -Will refer to radiation oncology -Will repeat a PET scan considering the last one was almost 2 months ago at this point.  Return to clinic in 2 weeks to discuss further management and prognosis  Smokes cigarettes -Patient is an avid smoker with history of smoking 2 packs/day until recently -Discussed the risks of smoking including faster progression of cancer, inflammation causing difficulties with chemotherapy and radiation -Offered help to quit smoking, patient is currently trying with nicotine gum.   Orders Placed This Encounter  Procedures   MR Brain W Wo Contrast    Standing Status:   Future    Standing Expiration Date:   11/11/2023    Order Specific Question:   If indicated for the ordered procedure, I authorize the administration of contrast media per Radiology protocol    Answer:   Yes    Order Specific Question:   What is the patient's sedation requirement?    Answer:   No Sedation    Order Specific Question:   Does the patient have a pacemaker or implanted devices?    Answer:   No    Order Specific Question:   Use SRS Protocol?    Answer:   No    Order Specific Question:   Preferred imaging location?    Answer:    [facility] (table limit [phone])    Order Specific Question:   Release to patient    Answer:   Immediate   IR IMAGING GUIDED PORT INSERTION    Standing Status:   Future    Standing Expiration Date:   11/11/2023    Order Specific Question:   Reason for Exam (SYMPTOM  OR DIAGNOSIS REQUIRED)    Answer:   chemotherapy administration    Order Specific Question:   Preferred Imaging Location?    Answer:   [facility]    Order Specific Question:   Release to patient    Answer:   Immediate   NM PET Image Restag (PS) Skull Base To Thigh    Standing Status:   Future    Standing Expiration Date:   11/11/2023    Order Specific Question:   If indicated for the ordered procedure, I authorize the administration of a radiopharmaceutical per Radiology protocol    Answer:   Yes    Order Specific Question:   Preferred imaging location?    Answer:   [facility]    Order Specific Question:   Release to patient    Answer:   Immediate    The total time spent in the appointment was 60 minutes encounter with patients including review of chart and various tests results, discussions about plan of care and coordination of care plan   All questions were answered. The patient knows to call the clinic with any problems, questions or concerns. No barriers to learning was detected.  Cindie Crumbly, MD 9/26/20243:52 PM

## 2022-11-11 NOTE — Patient Instructions (Addendum)
[facility]  Discharge Instructions  You were seen and examined today by Dr. Anders Simmonds. Dr. Anders Simmonds is a medical oncologist, meaning that he specializes in the treatment of cancer diagnoses. Dr. Anders Simmonds discussed your past medical history, family history of cancers, and the events that led to you being here today.  You were referred to Dr. Anders Simmonds for Small Cell Lung Cancer. This is an aggressive, fast growing type of lung cancer typically associated with a smoking history.  On your August PET scan, there were nodules noted in both lungs and a lymph node.  No matter what the stage of the cancer is, you will at the very least need chemotherapy. If we can treat this as two separate primary cancers, you will receive chemotherapy and radiation therapy. If it considered a spread of the cancer from one lung to the other, it will be treated as Extensive Stage Small Cell Lung Cancer with chemotherapy and immunotherapy.  At this time, Dr. Anders Simmonds has recommended a few things: An updated PET scan. It has been over a month since your initial PET scan and you have an aggressive cancer, we need an updated accurate staging scan to ensure we are not missing anything. Brain MRI. Lung cancers are notorious for spreading to the brain and we will need to ensure there is no spread of the cancer to the brain at this time. Radiation Oncology Referral to [facility] for a consultation to discuss radiation therapy. Port-A-Cath placement. This is the safest way to administer chemotherapy and immunotherapy if needed. Dr. Anders Simmonds will also discuss your case at the Tumor Board Conference to decide the best course of action.  Follow-up as scheduled with Dr. Anders Simmonds. You will likely start chemotherapy the same day that you see her.  Thank you for choosing [facility] to provide your oncology and hematology care.   To afford each patient quality time with our provider,  please arrive at least 15 minutes before your scheduled appointment time. You may need to reschedule your appointment if you arrive late (10 or more minutes). Arriving late affects you and other patients whose appointments are after yours.  Also, if you miss three or more appointments without notifying the office, you may be dismissed from the clinic at the provider's discretion.    Again, thank you for choosing [facility].  Our hope is that these requests will decrease the amount of time that you wait before being seen by our physicians.   If you have a lab appointment with the Cancer Center - please note that after April 8th, all labs will be drawn in the cancer center.  You do not have to check in or register with the main entrance as you have in the past but will complete your check-in at the cancer center.            _____________________________________________________________  Should you have questions after your visit to [facility], please contact our office at [phone] and follow the prompts.  Our office hours are 8:00 a.m. to 4:30 p.m. Monday - Thursday and 8:00 a.m. to 2:30 p.m. Friday.  Please note that voicemails left after 4:00 p.m. may not be returned until the following business day.  We are closed weekends and all major holidays.  You do have access to a nurse 24-7, just call the main number to the clinic [phone] and do not press any options, hold on the  line and a nurse will answer the phone.    For prescription refill requests, have your pharmacy contact our office and allow 72 hours.    Masks are no longer required in the cancer centers. If you would like for your care team to wear a mask while they are taking care of you, please let them know. You may have one support person who is at least 70 years old accompany you for your appointments.

## 2022-11-15 DIAGNOSIS — R609 Edema, unspecified: Secondary | ICD-10-CM | POA: Diagnosis not present

## 2022-11-15 DIAGNOSIS — F17210 Nicotine dependence, cigarettes, uncomplicated: Secondary | ICD-10-CM | POA: Diagnosis not present

## 2022-11-15 DIAGNOSIS — Z008 Encounter for other general examination: Secondary | ICD-10-CM | POA: Diagnosis not present

## 2022-11-15 DIAGNOSIS — J4489 Other specified chronic obstructive pulmonary disease: Secondary | ICD-10-CM | POA: Diagnosis not present

## 2022-11-15 DIAGNOSIS — I2510 Atherosclerotic heart disease of native coronary artery without angina pectoris: Secondary | ICD-10-CM | POA: Diagnosis not present

## 2022-11-15 DIAGNOSIS — I10 Essential (primary) hypertension: Secondary | ICD-10-CM | POA: Diagnosis not present

## 2022-11-15 DIAGNOSIS — J301 Allergic rhinitis due to pollen: Secondary | ICD-10-CM | POA: Diagnosis not present

## 2022-11-15 DIAGNOSIS — Z809 Family history of malignant neoplasm, unspecified: Secondary | ICD-10-CM | POA: Diagnosis not present

## 2022-11-15 DIAGNOSIS — E785 Hyperlipidemia, unspecified: Secondary | ICD-10-CM | POA: Diagnosis not present

## 2022-11-15 DIAGNOSIS — G4700 Insomnia, unspecified: Secondary | ICD-10-CM | POA: Diagnosis not present

## 2022-11-15 DIAGNOSIS — Z8249 Family history of ischemic heart disease and other diseases of the circulatory system: Secondary | ICD-10-CM | POA: Diagnosis not present

## 2022-11-15 DIAGNOSIS — G43909 Migraine, unspecified, not intractable, without status migrainosus: Secondary | ICD-10-CM | POA: Diagnosis not present

## 2022-11-15 DIAGNOSIS — Z7951 Long term (current) use of inhaled steroids: Secondary | ICD-10-CM | POA: Diagnosis not present

## 2022-11-16 ENCOUNTER — Other Ambulatory Visit: Payer: Self-pay | Admitting: Radiology

## 2022-11-17 ENCOUNTER — Ambulatory Visit (HOSPITAL_COMMUNITY)
Admission: RE | Admit: 2022-11-17 | Discharge: 2022-11-17 | Disposition: A | Discharge status: Home / Self Care | Payer: Medicare HMO | Source: Ambulatory Visit | Attending: Oncology | Admitting: Oncology

## 2022-11-17 ENCOUNTER — Encounter (HOSPITAL_COMMUNITY): Payer: Self-pay

## 2022-11-17 ENCOUNTER — Other Ambulatory Visit: Payer: Self-pay

## 2022-11-17 DIAGNOSIS — C7802 Secondary malignant neoplasm of left lung: Secondary | ICD-10-CM | POA: Diagnosis not present

## 2022-11-17 DIAGNOSIS — F17210 Nicotine dependence, cigarettes, uncomplicated: Secondary | ICD-10-CM | POA: Insufficient documentation

## 2022-11-17 DIAGNOSIS — C3490 Malignant neoplasm of unspecified part of unspecified bronchus or lung: Secondary | ICD-10-CM | POA: Diagnosis not present

## 2022-11-17 DIAGNOSIS — C7801 Secondary malignant neoplasm of right lung: Secondary | ICD-10-CM | POA: Insufficient documentation

## 2022-11-17 HISTORY — PX: IR IMAGING GUIDED PORT INSERTION: IMG5740

## 2022-11-17 MED ORDER — HEPARIN SOD (PORK) LOCK FLUSH 100 UNIT/ML IV SOLN
500.0000 [IU] | Freq: Once | INTRAVENOUS | Status: AC
  Administered 2022-11-17: 500 [IU] via INTRAVENOUS

## 2022-11-17 MED ORDER — HEPARIN SOD (PORK) LOCK FLUSH 100 UNIT/ML IV SOLN
INTRAVENOUS | Status: AC
  Filled 2022-11-17: qty 5

## 2022-11-17 MED ORDER — LIDOCAINE-EPINEPHRINE 1 %-1:100000 IJ SOLN
20.0000 mL | Freq: Once | INTRAMUSCULAR | Status: AC
  Administered 2022-11-17: 15 mL via INTRADERMAL

## 2022-11-17 MED ORDER — SODIUM CHLORIDE 0.9 % IV SOLN: INTRAVENOUS | Status: DC

## 2022-11-17 MED ORDER — LIDOCAINE-EPINEPHRINE 1 %-1:100000 IJ SOLN
INTRAMUSCULAR | Status: AC
  Filled 2022-11-17: qty 1

## 2022-11-17 MED ORDER — MIDAZOLAM HCL 2 MG/2ML IJ SOLN
INTRAMUSCULAR | Status: AC
  Filled 2022-11-17: qty 4

## 2022-11-17 MED ORDER — MIDAZOLAM HCL 2 MG/2ML IJ SOLN
INTRAMUSCULAR | Status: AC | PRN
  Administered 2022-11-17 (×2): 1 mg via INTRAVENOUS

## 2022-11-17 MED ORDER — FENTANYL CITRATE (PF) 100 MCG/2ML IJ SOLN
INTRAMUSCULAR | Status: AC | PRN
  Administered 2022-11-17 (×2): 50 ug via INTRAVENOUS

## 2022-11-17 MED ORDER — FENTANYL CITRATE (PF) 100 MCG/2ML IJ SOLN
INTRAMUSCULAR | Status: AC
  Filled 2022-11-17: qty 4

## 2022-11-17 NOTE — H&P (Signed)
Chief Complaint: Small cell lung cancer  Referring Provider(s): Cindie Crumbly  Supervising Physician: Simonne Come  Patient Status: [facility] - Out-pt  History of Present Illness: Virginia Powell is a 72 y.o. female who underwent screening CT scan on 6/24/24which showed a suspicious nodule in the right lower lobe and a new nodule in the left lower lobe.    PET scan done 09/17/22 showed evidence of the same.  She underwent EBUS with biopsy of both lesions along with lymph node sampling.    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      She is here today for placement of a tunneled catheter with port for chemotherapy.  She is NPO. No nausea/vomiting. No Fever/chills. ROS negative.  Patient is Full Code  Past Medical History:  Diagnosis Date   Complication of anesthesia    COPD (chronic obstructive pulmonary disease) (HCC)    Essential hypertension    Headache    Hyperlipidemia    Hypertension    Migraines    Pneumonia 04/2018   PONV (postoperative nausea and vomiting)    Psoriasis    Seasonal allergies    Smoker     Past Surgical History:  Procedure Laterality Date   ABDOMINAL HYSTERECTOMY     BRONCHIAL BIOPSY  10/04/2022   Procedure: BRONCHIAL BIOPSIES;  Surgeon: Leslye Peer, MD;  Location: [facility] ENDOSCOPY;  Service: Pulmonary;;   BRONCHIAL BRUSHINGS  10/04/2022   Procedure: BRONCHIAL BRUSHINGS;  Surgeon: Leslye Peer, MD;  Location: [facility] ENDOSCOPY;  Service: Pulmonary;;   BRONCHIAL NEEDLE ASPIRATION BIOPSY  10/04/2022   Procedure: BRONCHIAL NEEDLE ASPIRATION BIOPSIES;  Surgeon: Leslye Peer, MD;  Location: [facility] ENDOSCOPY;  Service: Pulmonary;;   BRONCHIAL WASHINGS  10/04/2022   Procedure: BRONCHIAL WASHINGS;  Surgeon: Leslye Peer, MD;  Location: [facility] ENDOSCOPY;  Service: Pulmonary;;   CATARACT EXTRACTION Bilateral    FIDUCIAL MARKER PLACEMENT  10/04/2022   Procedure: FIDUCIAL MARKER  PLACEMENT;  Surgeon: Leslye Peer, MD;  Location: [facility] ENDOSCOPY;  Service: Pulmonary;;   PARTIAL HYSTERECTOMY     TUBAL LIGATION     VIDEO BRONCHOSCOPY WITH ENDOBRONCHIAL ULTRASOUND N/A 10/04/2022   Procedure: VIDEO BRONCHOSCOPY WITH ENDOBRONCHIAL ULTRASOUND;  Surgeon: Leslye Peer, MD;  Location: [facility] ENDOSCOPY;  Service: Pulmonary;  Laterality: N/A;    Allergies: Morphine and codeine  Medications: Prior to Admission medications   Medication Sig Start Date End Date Taking? Authorizing Provider  albuterol (VENTOLIN HFA) 108 (90 Base) MCG/ACT inhaler SMARTSIG:2 Puff(s) By Mouth Every 4 Hours 11/18/21  Yes [provider]  atorvastatin (LIPITOR) 40 MG tablet Take 40 mg by mouth daily. 08/16/21  Yes [provider]  fexofenadine (ALLEGRA) 180 MG tablet Take 180 mg by mouth daily as needed for allergies or rhinitis.   Yes [provider]  furosemide (LASIX) 20 MG tablet Take 20 mg by mouth daily. 10/26/22  Yes [provider]  gabapentin (NEURONTIN) 300 MG capsule Take 600 mg by mouth at bedtime. 10/25/22  Yes [provider]  hydrochlorothiazide (HYDRODIURIL) 12.5 MG tablet Take 12.5 mg by mouth daily. 08/06/22  Yes [provider]  olmesartan (BENICAR) 40 MG tablet Take 1 tablet (40 mg total) by mouth daily. 02/04/22  Yes Nyoka Cowden, MD  rizatriptan (MAXALT-MLT) 10 MG disintegrating tablet Take 10 mg by mouth as needed for migraine. May repeat in 2 hours if needed   Yes [provider]  TRELEGY ELLIPTA 100-62.5-25 MCG/ACT AEPB Inhale 1 puff into the lungs daily. 01/14/22  Yes [provider]  albuterol (PROVENTIL) (2.5 MG/3ML) 0.083% nebulizer solution SMARTSIG:1 Vial(s) Via Nebulizer Every 4-6 Hours PRN 06/30/21   [provider]  fluticasone (FLONASE) 50 MCG/ACT nasal spray Place into both nostrils daily as needed.    [provider]  hydrOXYzine (ATARAX) 25 MG tablet Take 1 tablet by mouth at bedtime.  09/25/18   [provider]  loratadine (CLARITIN) 10 MG tablet Take 10 mg by mouth daily.    [provider]     Family History  Problem Relation Age of Onset   Heart attack Brother    Hyperlipidemia Brother    Hyperlipidemia Mother     Social History   Socioeconomic History   Marital status: Married    Spouse name: Not on file   Number of children: Not on file   Years of education: Not on file   Highest education level: Not on file  Occupational History   Not on file  Tobacco Use   Smoking status: Every Day    Current packs/day: 1.62    Average packs/day: 1.6 packs/day for 55.0 years (89.1 ttl pk-yrs)    Types: Cigarettes   Smokeless tobacco: Never   Tobacco comments:    Smoking 1-2 puffs.  Went 8 days without smoking.  Has not bought anymore.  11/09/2022 hfb  Vaping Use   Vaping status: Never Used  Substance and Sexual Activity   Alcohol use: Yes    Comment: social   Drug use: Never   Sexual activity: Not on file  Other Topics Concern   Not on file  Social History Narrative   ** Merged History Encounter **       Social Determinants of Health   Financial Resource Strain: Not on file  Food Insecurity: Not on file  Transportation Needs: Not on file  Physical Activity: Not on file  Stress: Not on file  Social Connections: Not on file     Review of Systems: A 12 point ROS discussed and pertinent positives are indicated in the HPI above.  All other systems are negative.    Vital Signs: BP 122/72 (BP Location: Left Arm)   Pulse 73   Temp 97.9 F (36.6 C) (Oral)   Resp 16   Ht 5\' 4"  (1.626 m)   Wt 113 lb (51.3 kg)   SpO2 95%   BMI 19.40 kg/m   Advance Care Plan: The advanced care place/surrogate decision maker was discussed at the time of visit and the patient did not wish to discuss or was not able to name a surrogate decision maker or provide an advance care plan.  Physical Exam Vitals reviewed.  Constitutional:      Appearance:  Normal appearance.  HENT:     Head: Normocephalic and atraumatic.  Eyes:     Extraocular Movements: Extraocular movements intact.  Cardiovascular:     Rate and Rhythm: Normal rate and regular rhythm.  Pulmonary:     Effort: Pulmonary effort is normal. No respiratory distress.     Breath sounds: Normal breath sounds.  Abdominal:     Palpations: Abdomen is soft.  Musculoskeletal:        General: Normal range of motion.     Cervical back: Normal range of motion.  Skin:    General: Skin is warm and dry.  Neurological:     General: No focal deficit present.     Mental Status:  She is alert and oriented to person, place, and time.  Psychiatric:        Mood and Affect: Mood normal.        Behavior: Behavior normal.        Thought Content: Thought content normal.        Judgment: Judgment normal.     Imaging: No results found.  Labs:  CBC: Recent Labs    10/04/22 0840  WBC 7.3  HGB 14.6  HCT 44.7  PLT 299    COAGS: No results for input(s): "INR", "APTT" in the last 8760 hours.  BMP: Recent Labs    10/04/22 0840  NA 139  K 4.0  CL 106  CO2 23  GLUCOSE 105*  BUN 14  CALCIUM 9.4  CREATININE 0.70  GFRNONAA >60    LIVER FUNCTION TESTS: No results for input(s): "BILITOT", "AST", "ALT", "ALKPHOS", "PROT", "ALBUMIN" in the last 8760 hours.  TUMOR MARKERS: No results for input(s): "AFPTM", "CEA", "CA199", "CHROMGRNA" in the last 8760 hours.  Assessment and Plan:  Small cell lung cancer.  Will proceed with image guided placement of a tunneled catheter with port today by Dr. Grace Isaac.  Risks and benefits of image guided port-a-catheter placement was discussed with the patient including, but not limited to bleeding, infection, pneumothorax, or fibrin sheath development and need for additional procedures.  All of the patient's questions were answered, patient is agreeable to proceed. Consent signed and in chart.  Thank you for allowing our service to participate  in Virginia Powell 's care.  Electronically Signed: Gwynneth Macleod, PA-C   11/17/2022, 10:13 AM      I spent a total of  30 Minutes  in face to face in clinical consultation, greater than 50% of which was counseling/coordinating care for [facility] A Cath placement

## 2022-11-17 NOTE — Procedures (Signed)
Pre Procedure Dx: Poor venous access Post Procedural Dx: Same  Successful placement of right IJ approach port-a-cath with tip at the superior caval atrial junction. The catheter is ready for immediate use.  Estimated Blood Loss: Trace  Complications: None immediate.  Jay Haile Bosler, MD Pager #: [phone]   

## 2022-11-18 ENCOUNTER — Encounter (HOSPITAL_COMMUNITY)
Admission: RE | Admit: 2022-11-18 | Discharge: 2022-11-18 | Disposition: A | Discharge status: Home / Self Care | Payer: Medicare HMO | Source: Ambulatory Visit | Attending: Oncology | Admitting: Oncology

## 2022-11-18 DIAGNOSIS — C3490 Malignant neoplasm of unspecified part of unspecified bronchus or lung: Secondary | ICD-10-CM | POA: Insufficient documentation

## 2022-11-18 DIAGNOSIS — R911 Solitary pulmonary nodule: Secondary | ICD-10-CM | POA: Diagnosis not present

## 2022-11-18 MED ORDER — FLUDEOXYGLUCOSE F - 18 (FDG) INJECTION
6.0900 | Freq: Once | INTRAVENOUS | Status: AC | PRN
  Administered 2022-11-18: 6.09 via INTRAVENOUS

## 2022-11-19 ENCOUNTER — Other Ambulatory Visit: Payer: Self-pay

## 2022-11-19 DIAGNOSIS — C771 Secondary and unspecified malignant neoplasm of intrathoracic lymph nodes: Secondary | ICD-10-CM | POA: Diagnosis not present

## 2022-11-19 DIAGNOSIS — C3480 Malignant neoplasm of overlapping sites of unspecified bronchus and lung: Secondary | ICD-10-CM | POA: Diagnosis not present

## 2022-11-19 DIAGNOSIS — I10 Essential (primary) hypertension: Secondary | ICD-10-CM | POA: Diagnosis not present

## 2022-11-19 DIAGNOSIS — C3490 Malignant neoplasm of unspecified part of unspecified bronchus or lung: Secondary | ICD-10-CM | POA: Diagnosis not present

## 2022-11-19 DIAGNOSIS — C7802 Secondary malignant neoplasm of left lung: Secondary | ICD-10-CM

## 2022-11-19 DIAGNOSIS — Z780 Asymptomatic menopausal state: Secondary | ICD-10-CM | POA: Diagnosis not present

## 2022-11-19 DIAGNOSIS — R928 Other abnormal and inconclusive findings on diagnostic imaging of breast: Secondary | ICD-10-CM | POA: Diagnosis not present

## 2022-11-19 DIAGNOSIS — Z510 Encounter for antineoplastic radiation therapy: Secondary | ICD-10-CM | POA: Diagnosis not present

## 2022-11-19 DIAGNOSIS — J449 Chronic obstructive pulmonary disease, unspecified: Secondary | ICD-10-CM | POA: Diagnosis not present

## 2022-11-19 DIAGNOSIS — F17210 Nicotine dependence, cigarettes, uncomplicated: Secondary | ICD-10-CM | POA: Diagnosis not present

## 2022-11-19 NOTE — Progress Notes (Signed)
The proposed treatment discussed in conference is for discussion purpose only and is not a binding recommendation.  The patients have not been physically examined, or presented with their treatment options.  Therefore, final treatment plans cannot be decided.  

## 2022-11-20 LAB — ACID FAST CULTURE WITH REFLEXED SENSITIVITIES (MYCOBACTERIA): Acid Fast Culture: NEGATIVE

## 2022-11-22 ENCOUNTER — Inpatient Hospital Stay: Payer: Medicare HMO | Admitting: Dietician

## 2022-11-22 ENCOUNTER — Ambulatory Visit (HOSPITAL_COMMUNITY)
Admission: RE | Admit: 2022-11-22 | Discharge: 2022-11-22 | Disposition: A | Discharge status: Home / Self Care | Payer: Medicare HMO | Source: Ambulatory Visit | Attending: Oncology | Admitting: Oncology

## 2022-11-22 DIAGNOSIS — C3490 Malignant neoplasm of unspecified part of unspecified bronchus or lung: Secondary | ICD-10-CM | POA: Diagnosis not present

## 2022-11-22 MED ORDER — GADOBUTROL 1 MMOL/ML IV SOLN
5.0000 mL | Freq: Once | INTRAVENOUS | Status: AC | PRN
  Administered 2022-11-22: 5 mL via INTRAVENOUS

## 2022-11-24 NOTE — Patient Instructions (Incomplete)
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 You will be treated in the clinic 3 days in a row every 3 weeks in conjunction with radiation therapy with a combination of chemotherapy drugs. Those drugs are Cisplatin and Etoposide. On Day 1 of treatment you will receive both chemotherapy drugs. On Days 2-3 of treatment you will receive etoposide only.  The intent of treatment is cure. You will see the doctor regularly throughout treatment.  We will obtain blood work from you prior to every treatment and monitor your results to make sure it is safe to give your treatment. The doctor monitors your response to treatment by the way you are feeling, your blood work, and by obtaining scans periodically.  There will be wait times while you are here for treatment.  It will take about 30 minutes to 1 hour for your lab work to result. Then there will be wait times while pharmacy mixes your medications.     Medications you will receive in the clinic prior to your chemotherapy medications:  Aloxi:  ALOXI is used in adults to help prevent nausea and vomiting that happens with certain chemotherapy drugs.  Aloxi is a long acting medication, and will remain in your system for about two days.   Emend:  This is an anti-nausea medication that is used with Aloxi to help prevent nausea and vomiting caused by chemotherapy.  Dexamethasone:  This is a steroid given prior to chemotherapy to help prevent allergic reactions; it may also help prevent and control nausea and diarrhea.     Cisplatin   About This Drug   Cisplatin is a drug used to treat cancer. This drug is given in the vein (IV).  This will take 1 hour to infuse.  With this drug you will receive 2 hours of IV fluid hydration prior to administration, and 1 hour of IV hydration after administration.  This is to help protect your kidneys.  You will have to urinate 200 mL prior to receiving this  medication.  We will give you something to measure your urine in.  Possible Side Effects   Bone marrow suppression. This is a decrease in the number of white blood cells, red blood cells, and platelets. This may raise your risk of infection, make you tired and weak, and raise your risk of bleeding.    Decreased hearing, ringing in the ear    Nausea and vomiting (throwing up)    Changes in your kidney function    Effects on the nerves called peripheral neuropathy. You may feel numbness, tingling, or pain in your hands and feet. It may be hard for you to button your clothes, open jars, or walk as usual. The effect on the nerves may get worse with more doses of the drug. These effects get better in some people after the drug is stopped but it does not get better in all people.    Hair loss. Hair loss is often temporary, although with certain medicine, hair loss can sometimes be permanent. Hair loss may happen suddenly or gradually. If you lose hair, you may lose it from your head, face, armpits, pubic area, chest, and/or legs. You may also notice your hair getting thin.   Note: Not all possible side effects are included above.   Warnings and Precautions   Hearing loss in one or both ears may happen. While very rare, it is not known if these effects are reversible.  Blurred vision or other changes in eyesight    Severe changes in your kidney function, which can cause kidney failure.    Severe bone marrow suppression and neutropenic fever, a type of fever that can develop when you have a very low number of white blood cells which can be life-threatening.    Severe nausea and vomiting if medications to prevent these symptoms are not taken    Severe effects on the nerves    Allergic reactions, including anaphylaxis are rare but may happen in some patients and can be life-threatening Signs of allergic reaction to this drug may be swelling of the face, feeling like your tongue or throat are  swelling, trouble breathing, rash, itching, fever, chills, feeling dizzy, and/or feeling that your heart is beating in a fast or not normal way. If this happens, do not take another dose of this drug. You should get urgent medical treatment.    This drug may raise your risk of getting a second cancer, such as acute leukemia.    Skin and tissue irritation including redness, pain, warmth, or swelling at the IV site if the drug leaks out of the vein and into nearby tissue. Very rarely it may cause local tissue necrosis (tissue death). Note: Some of the side effects above are very rare. If you have concerns and/or questions, please discuss them with your medical team. Important Information    This drug may be present in the saliva, tears, sweat, urine, stool, vomit, semen, and vaginal secretions. Talk to your doctor and/or your nurse about the necessary precautions to take during this time.   Treating Side Effects    Manage tiredness by pacing your activities for the day.    Be sure to include periods of rest between energy-draining activities.  To decrease the risk of infection, wash your hands regularly.    Avoid close contact with people who have a cold, the flu, or other infections.    Take your temperature as your doctor or nurse tells you, and whenever you feel like you may have a fever.    To help decrease the risk of bleeding, use a soft toothbrush. Check with your nurse before using dental floss.    Be very careful when using knives or tools.    Use an electric shaver instead of a razor.    Drink plenty of fluids (a minimum of eight glasses per day is recommended).    If you throw up, you should drink more fluids so that you do not become dehydrated (lack of water in the body from losing too much fluid).    To help with nausea and vomiting, eat small, frequent meals instead of three large meals a day. Choose foods and drinks that are at room temperature. Ask your nurse or doctor  about other helpful tips and medicine that is available to help stop or lessen these symptoms.    If you have numbness and tingling in your hands and feet, be careful when cooking, walking, and handling sharp objects and hot liquids.    To help with hair loss, wash with a mild shampoo and avoid washing your hair every day. Avoid coloring your hair.    Avoid rubbing your scalp, pat your hair or scalp dry.    Limit your use of hair spray, electric curlers, blow dryers, and curling irons.    If you are interested in getting a wig, talk to your nurse and they can help you get in touch with  programs in your local area.    Food and Drug Interactions   There are no known interactions of cisplatin with food.    This drug may interact with other medicines. Tell your doctor and pharmacist about all the prescription and over-the-counter medicines and dietary supplements (vitamins, minerals, herbs, and others) that you are taking at this time. Also, check with your doctor or pharmacist before starting any new prescription or over-the-counter medicines, or dietary supplements to make sure that there are no interactions.   When to Call the Doctor   Call your doctor or nurse if you have any of these symptoms and/or any new or unusual symptoms:    Fever of 100.4 F (38 C) or higher    Chills    Blurred vision or other changes in eyesight    Tiredness that interferes with your daily activities  Feeling dizzy or lightheaded    Easy bleeding or bruising    Decreased or loss of hearing    Ringing in the ear    Nausea that stops you from eating or drinking and/or is not relieved by prescribed medicines    Throwing up more than 3 times a day    Decreased or very dark urine    Numbness, tingling, or pain in your hands and feet    While you are getting this drug, please tell your nurse right away if you have any pain, redness, or swelling at the site of the IV infusion.    Signs of allergic  reaction: swelling of the face, feeling like your tongue or throat are swelling, trouble breathing, rash, itching, fever, chills, feeling dizzy, and/or feeling that your heart is beating in a fast or not normal way. If this happens, call 911 for emergency care.    If you think you may be pregnant or may have impregnated your partner   Reproduction Warnings    Pregnancy warning: This drug can have harmful effects on the unborn baby. Women of childbearing potential should use effective methods of birth control during your cancer treatment and for at 14 months after stopping treatment. Men with female partners of childbearing potential should use effective methods of birth control during your cancer treatment and for 11 months after stopping treatment. Let your doctor know right away if you think you may be pregnant or may have impregnated your partner.    Breastfeeding warning: Women should not breastfeed during treatment because this drug could enter the breast milk and cause harm to a breastfeeding baby.     Fertility warning: In men and women both, this drug may affect your ability to have children in the future. Talk with your doctor or nurse if you plan to have children. Ask for information on sperm or egg banking.    Etoposide (VePesid, Etopophos)    About This Drug   Etoposide is used to treat cancer. It is given in the vein (IV) and orally (by mouth).   Possible Side Effects    Bone marrow suppression. This is a decrease in the number of white blood cells, red blood cells, and platelets. This may raise your risk of infection, make you tired and weak, and raise your risk of bleeding.    Nausea and vomiting (throwing up)    Hair loss. Hair loss is often temporary, although with certain medicine, hair loss can sometimes be permanent. Hair loss may happen suddenly or gradually. If you lose hair, you may lose it from your head, face, armpits, pubic  area, chest, and/or legs. You may also  notice your hair getting thin.   Note: Each of the side effects above was reported in 20% or greater of patients treated with etoposide. Not all possible side effects are included above.   Warnings and Precautions    Severe bone marrow suppression, which may be life-threatening.    This drug may raise your risk of getting a second cancer, such as acute leukemia.    Low blood pressure with rapid infusion of the medication    Allergic reactions, including anaphylaxis are rare but may happen in some patients. Signs of allergic reaction to this drug may be swelling of the face, feeling like your tongue or throat are swelling, trouble breathing, rash, itching, fever, chills, feeling dizzy, and/or feeling that your heart is beating in a fast or not normal way. If this happens, do not take another dose of this drug. You should get urgent medical treatment.    These side effects may be more severe if you are receiving high doses of this medication included in pre-transplant chemotherapy.    This drug may be present in the saliva, tears, sweat, urine, stool, vomit, semen, and vaginal secretions. Talk to your doctor and/or your nurse about the necessary precautions to take during this time.    Treating Side Effects    Manage tiredness by pacing your activities for the day.    Be sure to include periods of rest between energy-draining activities.    To decrease the risk of infection, wash your hands regularly.    Avoid close contact with people who have a cold, the flu, or other infections.    Take your temperature as your doctor or nurse tells you, and whenever you feel like you may have a fever.    To help decrease the risk of bleeding, use a soft toothbrush. Check with your nurse before using dental floss.    Be very careful when using knives or tools.    Use an electric shaver instead of a razor.    Drink plenty of fluids (a minimum of eight glasses per day is recommended).    If you  throw up, you should drink more fluids so that you do not become dehydrated (lack of water in the body from losing too much fluid).    To help with nausea and vomiting, eat small, frequent meals instead of three large meals a day. Choose foods and drinks that are at room temperature. Ask your nurse or doctor about other helpful tips and medicine that is available to help stop or lessen these symptoms.    To help with hair loss, wash with a mild shampoo and avoid washing your hair every day. Avoid coloring your hair.    Avoid rubbing your scalp, pat your hair or scalp dry.    Limit your use of hair spray, electric curlers, blow dryers, and curling irons.    If you are interested in getting a wig, talk to your nurse and they can help you get in touch with programs in your local area. Food and Drug Interactions  This drug may interact with grapefruit and grapefruit juice. Talk to your doctor as this could make side effects worse.     Check with your doctor or pharmacist about all other prescription medicines and over-the-counter medicines and dietary supplements (vitamins, minerals, herbs, and others) you are taking before starting this medicine as there are known drug interactions with etoposide. Also, check with your doctor or  pharmacist before starting any new prescription or over-the-counter medicines, or dietary supplements to make sure that there are no interactions.    There are known interactions of etoposide with blood thinning medicine such as warfarin. Ask your doctor what precautions you should take.   When to Call the Doctor  Call your doctor or nurse if you have any of these symptoms and/or any new or unusual symptoms:    Fever of 100.4 F (38 C) or higher    Chills    Tiredness that interferes with your daily activities  Feeling dizzy or lightheaded  Easy bleeding or bruising    Nausea that stops you from eating or drinking and/or is not relieved by prescribed medicines     Throwing up more than 3 times a day    Signs of allergic reaction: swelling of the face, feeling like your tongue or throat are swelling, trouble breathing, rash, itching, fever, chills, feeling dizzy, and/or feeling that your heart is beating in a fast or not normal way. If this happens, call 911 for emergency care.    If you think you may be pregnant or may have impregnated your partner   Reproduction Warnings    Pregnancy warning: This drug can have harmful effects on the unborn baby. Women of childbearing potential should use effective methods of birth control during your cancer treatment and for at least 6 months after treatment. Men with female partners of childbearing potential should use effective methods of birth control (or condoms) during your cancer treatment and for at least 4 months after your cancer treatment. Let your doctor know right away if you think you may be pregnant or may have impregnated your partner.    Breastfeeding warning: It is not known if this drug passes into breast milk. For this reason, women should talk to their doctor about the risks and benefits of breastfeeding during treatment with this drug because this drug may enter the breast milk and cause harm to a breastfeeding baby.    Fertility warning: In men and women both, this drug may affect your ability to have children in the future. Talk with your doctor or nurse if you plan to have children. Ask for information on sperm or egg banking.   SELF CARE ACTIVITIES WHILE ON CHEMOTHERAPY/IMMUNOTHERAPY:  Hydration Increase your fluid intake and drink at least 64 ounces (2 liters) of water/decaffeinated beverages per day after treatment. You can still have your cup of coffee or soda but these beverages do not count as part of the 64 ounces that you need to drink daily. Limit alcohol intake.  Medications Continue taking your normal prescription medication as prescribed.  If you start any new herbal or new  supplements please let us know first to make sure it is safe.  Mouth Care Have teeth cleaned professionally before starting treatment. Keep dentures and partial plates clean. Use soft toothbrush and do not use mouthwashes that contain alcohol. Biotene is a good mouthwash that is available at most pharmacies or may be ordered by calling [phone]. Use warm salt water gargles (1 teaspoon salt per 1 quart warm water) before and after meals and at bedtime. If you are still having problems with your mouth or sores in your mouth please call the clinic. If you need dental work, please let the doctor know before you go for your appointment so that we can coordinate the best possible time for you in regards to your chemo regimen. You need to also let your  dentist know that you are actively taking chemo. We may need to do labs prior to your dental appointment.  Skin Care Always use sunscreen that has not expired and with SPF (Sun Protection Factor) of 50 or higher. Wear hats to protect your head from the sun. Remember to use sunscreen on your hands, ears, face, & feet.  Use good moisturizing lotions such as udder cream, eucerin, or even Vaseline. Some chemotherapies can cause dry skin, color changes in your skin and nails.    Avoid long, hot showers or baths. Use gentle, fragrance-free soaps and laundry detergent. Use moisturizers, preferably creams or ointments rather than lotions because the thicker consistency is better at preventing skin dehydration. Apply the cream or ointment within 15 minutes of showering. Reapply moisturizer at night, and moisturize your hands every time after you wash them.   Infection Prevention Please wash your hands for at least 30 seconds using warm soapy water. Handwashing is the #1 way to prevent the spread of germs. Stay away from sick people or people who are getting over a cold. If you develop respiratory systems such as green/yellow mucus production or productive cough or  persistent cough let us know and we will see if you need an antibiotic. It is a good idea to keep a pair of gloves on when going into grocery stores/[employer] to decrease your risk of coming into contact with germs on the carts, etc. Carry alcohol hand gel with you at all times and use it frequently if out in public. If your temperature reaches 100.5 or higher please call the clinic and let us know.  If it is after hours or on the weekend please go to the ER if your temperature is over 100.4.  Please have your own personal thermometer at home to use.    Sex and bodily fluids If you are going to have sex, a condom must be used to protect the person that isn't taking immunotherapy. For a few days after treatment, immunotherapy can be excreted through your bodily fluids.  When using the toilet please close the lid and flush the toilet twice.  Do this for a few day after you have had immunotherapy.   Contraception It is not known for sure whether or not immunotherapy drugs can be passed on through semen or secretions from the vagina. Because of this some doctors advise people to use a barrier method if you have sex during treatment. This applies to vaginal, anal or oral sex.  Generally, doctors advise a barrier method only for the time you are actually having the treatment and for about a week after your treatment.  Advice like this can be worrying, but this does not mean that you have to avoid being intimate with your partner. You can still have close contact with your partner and continue to enjoy sex.  Animals If you have cats or birds we ask that you not change the litter or change the cage.  Please have someone else do this for you while you are on immunotherapy.   Food Safety During and After Cancer Treatment Food safety is important for people both during and after cancer treatment. Cancer and cancer treatments, such as chemotherapy, radiation therapy, and stem cell/bone marrow transplantation,  often weaken the immune system. This makes it harder for your body to protect itself from foodborne illness, also called food poisoning. Foodborne illness is caused by eating food that contains harmful bacteria, parasites, or viruses.  Foods to avoid Some foods  have a higher risk of becoming tainted with bacteria. These include: Unwashed fresh fruit and vegetables, especially leafy vegetables that can hide dirt and other contaminants Raw sprouts, such as alfalfa sprouts Raw or undercooked beef, especially ground beef, or other raw or undercooked meat and poultry Fatty, fried, or spicy foods immediately before or after treatment.  These can sit heavy on your stomach and make you feel nauseous. Raw or undercooked shellfish, such as oysters. Sushi and sashimi, which often contain raw fish.  Unpasteurized beverages, such as unpasteurized fruit juices, raw milk, raw yogurt, or cider Undercooked eggs, such as soft boiled, over easy, and poached; raw, unpasteurized eggs; or foods made with raw egg, such as homemade raw cookie dough and homemade mayonnaise  Simple steps for food safety  Shop smart. Do not buy food stored or displayed in an unclean area. Do not buy bruised or damaged fruits or vegetables. Do not buy cans that have cracks, dents, or bulges. Pick up foods that can spoil at the end of your shopping trip and store them in a cooler on the way home.  Prepare and clean up foods carefully. Rinse all fresh fruits and vegetables under running water, and dry them with a clean towel or paper towel. Clean the top of cans before opening them. After preparing food, wash your hands for 20 seconds with hot water and soap. Pay special attention to areas between fingers and under nails. Clean your utensils and dishes with hot water and soap. Disinfect your kitchen and cutting boards using 1 teaspoon of liquid, unscented bleach mixed into 1 quart of water.    Dispose of old food. Eat canned and  packaged food before its expiration date (the "use by" or "best before" date). Consume refrigerated leftovers within 3 to 4 days. After that time, throw out the food. Even if the food does not smell or look spoiled, it still may be unsafe. Some bacteria, such as Listeria, can grow even on foods stored in the refrigerator if they are kept for too long.  Take precautions when eating out. At restaurants, avoid buffets and salad bars where food sits out for a long time and comes in contact with many people. Food can become contaminated when someone with a virus, often a norovirus, or another "bug" handles it. Put any leftover food in a "to-go" container yourself, rather than having the server do it. And, refrigerate leftovers as soon as you get home. Choose restaurants that are clean and that are willing to prepare your food as you order it cooked.    SYMPTOMS TO REPORT AS SOON AS POSSIBLE AFTER TREATMENT:  FEVER GREATER THAN 100.4 F CHILLS WITH OR WITHOUT FEVER NAUSEA AND VOMITING THAT IS NOT CONTROLLED WITH YOUR NAUSEA MEDICATION UNUSUAL SHORTNESS OF BREATH UNUSUAL BRUISING OR BLEEDING TENDERNESS IN MOUTH AND THROAT WITH OR WITHOUT PRESENCE OF ULCERS URINARY PROBLEMS BOWEL PROBLEMS UNUSUAL RASH     Wear comfortable clothing and clothing appropriate for easy access to any Portacath or PICC line. Let us know if there is anything that we can do to make your therapy better!   What to do if you need assistance after hours or on the weekends: CALL [phone].  HOLD on the line, do not hang up.  You will hear multiple messages but at the end you will be connected with a nurse triage line.  They will contact the doctor if necessary.  Most of the time they will be able to assist you.  Do  not call the hospital operator.    I have been informed and understand all of the instructions given to me and have received a copy. I have been instructed to call the clinic [phone] or my family  physician as soon as possible for continued medical care, if indicated. I do not have any more questions at this time but understand that I may call the Cancer Center or the Patient Navigator at [phone] during office hours should I have questions or need assistance in obtaining follow-up care.

## 2022-11-25 ENCOUNTER — Inpatient Hospital Stay: Payer: Medicare HMO | Attending: Oncology

## 2022-11-25 ENCOUNTER — Inpatient Hospital Stay: Payer: Medicare HMO | Admitting: Licensed Clinical Social Worker

## 2022-11-25 DIAGNOSIS — C7802 Secondary malignant neoplasm of left lung: Secondary | ICD-10-CM

## 2022-11-25 DIAGNOSIS — C3490 Malignant neoplasm of unspecified part of unspecified bronchus or lung: Secondary | ICD-10-CM | POA: Insufficient documentation

## 2022-11-25 DIAGNOSIS — Z5111 Encounter for antineoplastic chemotherapy: Secondary | ICD-10-CM | POA: Insufficient documentation

## 2022-11-25 DIAGNOSIS — Z5189 Encounter for other specified aftercare: Secondary | ICD-10-CM | POA: Insufficient documentation

## 2022-11-25 DIAGNOSIS — C7801 Secondary malignant neoplasm of right lung: Secondary | ICD-10-CM | POA: Insufficient documentation

## 2022-11-25 DIAGNOSIS — F17210 Nicotine dependence, cigarettes, uncomplicated: Secondary | ICD-10-CM | POA: Insufficient documentation

## 2022-11-25 MED ORDER — PROCHLORPERAZINE MALEATE 10 MG PO TABS: 10.0000 mg | ORAL_TABLET | Freq: Four times a day (QID) | ORAL | 2 refills | Status: DC | PRN

## 2022-11-25 MED ORDER — LIDOCAINE-PRILOCAINE 2.5-2.5 % EX CREA: TOPICAL_CREAM | CUTANEOUS | 3 refills | Status: AC

## 2022-11-25 NOTE — Progress Notes (Signed)

## 2022-11-25 NOTE — Progress Notes (Signed)
[facility] Clinical Social Work  Initial Assessment   Virginia Powell is a 72 y.o. year old female accompanied by patient, son, and husband. Clinical Social Work was referred by medical provider for assessment of psychosocial needs.   SDOH (Social Determinants of Health) assessments performed: Yes   SDOH Screenings   Tobacco Use: High Risk (11/19/2022)   Received from [facility]     Distress Screen completed: No     No data to display            Family/Social Information:  Housing Arrangement: patient lives with her husband. Family members/support persons in your life? The couple has 2 sons and a daughter who reside locally and can offer support as needed.   Transportation concerns: no  Employment: Retired .  Income source: Actor concerns:  No concerns at present, but pt is on a fixed income and is worried about medical bills Type of concern: None Food access concerns: no Religious or spiritual practice: Data processing manager Currently in place:  none  Coping/ Adjustment to diagnosis: Patient understands treatment plan and what happens next? yes Concerns about diagnosis and/or treatment: Overwhelmed by information and Quality of life Patient reported stressors: Adjusting to my illness Hopes and/or priorities: Pt's priority is to start treatment w/ the hope of positive results. Patient enjoys  not addressed Current coping skills/ strengths: Capable of independent living , Motivation for treatment/growth , Physical Health , and Supportive family/friends     SUMMARY: Current SDOH Barriers:  No barriers identified at this time  Clinical Social Work Clinical Goal(s):  No clinical social work goals at this time  Interventions: Discussed common feeling and emotions when being diagnosed with cancer, and the importance of support during treatment Informed patient of the support team roles and support services at [facility] Provided CSW  contact information and encouraged patient to call with any questions or concerns Referred patient to Marijean Niemann for additional support.   Follow Up Plan: Patient will contact CSW with any support or resource needs Patient verbalizes understanding of plan: Yes    Rachel Moulds, LCSW Clinical Social Worker [facility]

## 2022-11-29 ENCOUNTER — Encounter: Payer: Self-pay | Admitting: Oncology

## 2022-11-29 ENCOUNTER — Other Ambulatory Visit: Payer: Self-pay | Admitting: Oncology

## 2022-11-29 DIAGNOSIS — C7802 Secondary malignant neoplasm of left lung: Secondary | ICD-10-CM

## 2022-11-29 NOTE — Progress Notes (Signed)
START ON PATHWAY REGIMEN - Small Cell Lung     A cycle is every 21 days:     Etoposide      Cisplatin   **Always confirm dose/schedule in your pharmacy ordering system**  Patient Characteristics: Newly Diagnosed, Preoperative or Nonsurgical Candidate (Clinical Staging), First Line, Limited Stage, Nonsurgical Candidate Therapeutic Status: Newly Diagnosed, Preoperative or Nonsurgical Candidate (Clinical Staging) AJCC T Category: cT1b AJCC N Category: cN3 AJCC M Category: cM1a AJCC 8 Stage Grouping: IVA Stage Classification: Limited Surgical Candidacy: Nonsurgical Candidate Intent of Therapy: Curative Intent, Discussed with Patient

## 2022-11-30 ENCOUNTER — Encounter: Payer: Self-pay | Admitting: Oncology

## 2022-11-30 ENCOUNTER — Other Ambulatory Visit: Payer: Self-pay

## 2022-11-30 ENCOUNTER — Inpatient Hospital Stay: Payer: Medicare HMO

## 2022-11-30 ENCOUNTER — Inpatient Hospital Stay: Payer: Medicare HMO | Admitting: Oncology

## 2022-11-30 VITALS — BP 113/74 | HR 96 | Temp 97.5°F | Resp 18 | Wt 116.2 lb

## 2022-11-30 VITALS — BP 128/69 | HR 91 | Temp 98.9°F | Resp 20

## 2022-11-30 DIAGNOSIS — C7801 Secondary malignant neoplasm of right lung: Secondary | ICD-10-CM

## 2022-11-30 DIAGNOSIS — F17210 Nicotine dependence, cigarettes, uncomplicated: Secondary | ICD-10-CM

## 2022-11-30 DIAGNOSIS — C7802 Secondary malignant neoplasm of left lung: Secondary | ICD-10-CM

## 2022-11-30 DIAGNOSIS — Z5189 Encounter for other specified aftercare: Secondary | ICD-10-CM | POA: Diagnosis not present

## 2022-11-30 DIAGNOSIS — C3490 Malignant neoplasm of unspecified part of unspecified bronchus or lung: Secondary | ICD-10-CM | POA: Diagnosis not present

## 2022-11-30 DIAGNOSIS — Z5111 Encounter for antineoplastic chemotherapy: Secondary | ICD-10-CM | POA: Diagnosis not present

## 2022-11-30 LAB — COMPREHENSIVE METABOLIC PANEL
ALT: 25 U/L (ref 0–44)
AST: 21 U/L (ref 15–41)
Albumin: 3.9 g/dL (ref 3.5–5.0)
Alkaline Phosphatase: 45 U/L (ref 38–126)
Anion gap: 10 (ref 5–15)
BUN: 25 mg/dL — ABNORMAL HIGH (ref 8–23)
CO2: 22 mmol/L (ref 22–32)
Calcium: 8.8 mg/dL — ABNORMAL LOW (ref 8.9–10.3)
Chloride: 102 mmol/L (ref 98–111)
Creatinine, Ser: 0.77 mg/dL (ref 0.44–1.00)
GFR, Estimated: >60 mL/min (ref >60)
Glucose, Bld: 105 mg/dL — ABNORMAL HIGH (ref 70–99)
Potassium: 3.5 mmol/L (ref 3.5–5.1)
Sodium: 134 mmol/L — ABNORMAL LOW (ref 135–145)
Total Bilirubin: 0.6 mg/dL (ref 0.3–1.2)
Total Protein: 6.5 g/dL (ref 6.5–8.1)

## 2022-11-30 LAB — CBC WITH DIFFERENTIAL/PLATELET
Abs Immature Granulocytes: 0.04 10*3/uL (ref 0.00–0.07)
Basophils Absolute: 0 10*3/uL (ref 0.0–0.1)
Basophils Relative: 0 %
Eosinophils Absolute: 0.1 10*3/uL (ref 0.0–0.5)
Eosinophils Relative: 1 %
HCT: 38.9 % (ref 36.0–46.0)
Hemoglobin: 13.1 g/dL (ref 12.0–15.0)
Immature Granulocytes: 1 %
Lymphocytes Relative: 26 %
Lymphs Abs: 2.1 10*3/uL (ref 0.7–4.0)
MCH: 32.5 pg (ref 26.0–34.0)
MCHC: 33.7 g/dL (ref 30.0–36.0)
MCV: 96.5 fL (ref 80.0–100.0)
Monocytes Absolute: 0.6 10*3/uL (ref 0.1–1.0)
Monocytes Relative: 7 %
Neutro Abs: 5.3 10*3/uL (ref 1.7–7.7)
Neutrophils Relative %: 65 %
Platelets: 309 10*3/uL (ref 150–400)
RBC: 4.03 MIL/uL (ref 3.87–5.11)
RDW: 13.8 % (ref 11.5–15.5)
WBC: 8 10*3/uL (ref 4.0–10.5)
nRBC: 0 % (ref 0.0–0.2)

## 2022-11-30 LAB — MAGNESIUM: Magnesium: 1.9 mg/dL (ref 1.7–2.4)

## 2022-11-30 MED ORDER — MAGNESIUM SULFATE 2 GM/50ML IV SOLN
2.0000 g | Freq: Once | INTRAVENOUS | Status: AC
  Administered 2022-11-30: 2 g via INTRAVENOUS
  Filled 2022-11-30: qty 50

## 2022-11-30 MED ORDER — HEPARIN SOD (PORK) LOCK FLUSH 100 UNIT/ML IV SOLN
500.0000 [IU] | Freq: Once | INTRAVENOUS | Status: AC | PRN
  Administered 2022-11-30: 500 [IU]

## 2022-11-30 MED ORDER — SODIUM CHLORIDE 0.9 % IV SOLN
60.0000 mg/m2 | Freq: Once | INTRAVENOUS | Status: AC
  Administered 2022-11-30: 100 mg via INTRAVENOUS
  Filled 2022-11-30: qty 100

## 2022-11-30 MED ORDER — SODIUM CHLORIDE 0.9 % IV SOLN
100.0000 mg/m2 | Freq: Once | INTRAVENOUS | Status: AC
  Administered 2022-11-30: 152 mg via INTRAVENOUS
  Filled 2022-11-30: qty 7.6

## 2022-11-30 MED ORDER — SODIUM CHLORIDE 0.9% FLUSH
10.0000 mL | INTRAVENOUS | Status: DC | PRN
  Administered 2022-11-30: 10 mL via INTRAVENOUS

## 2022-11-30 MED ORDER — SODIUM CHLORIDE 0.9% FLUSH
10.0000 mL | INTRAVENOUS | Status: DC | PRN
  Administered 2022-11-30: 10 mL

## 2022-11-30 MED ORDER — POTASSIUM CHLORIDE IN NACL 20-0.9 MEQ/L-% IV SOLN
Freq: Once | INTRAVENOUS | Status: AC
  Filled 2022-11-30: qty 1000

## 2022-11-30 MED ORDER — SODIUM CHLORIDE 0.9 % IV SOLN
150.0000 mg | Freq: Once | INTRAVENOUS | Status: AC
  Administered 2022-11-30: 150 mg via INTRAVENOUS
  Filled 2022-11-30: qty 150

## 2022-11-30 MED ORDER — SODIUM CHLORIDE 0.9 % IV SOLN
10.0000 mg | Freq: Once | INTRAVENOUS | Status: AC
  Administered 2022-11-30: 10 mg via INTRAVENOUS
  Filled 2022-11-30: qty 10

## 2022-11-30 MED ORDER — PALONOSETRON HCL INJECTION 0.25 MG/5ML
0.2500 mg | Freq: Once | INTRAVENOUS | Status: AC
  Administered 2022-11-30: 0.25 mg via INTRAVENOUS
  Filled 2022-11-30: qty 5

## 2022-11-30 MED ORDER — SODIUM CHLORIDE 0.9 % IV SOLN: INTRAVENOUS | Status: DC

## 2022-11-30 NOTE — Progress Notes (Signed)
Pharmacist Chemotherapy Monitoring - Initial Assessment    Anticipated start date: 11/30/22   The following has been reviewed per standard work regarding the patient's treatment regimen: The patient's diagnosis, treatment plan and drug doses, and organ/hematologic function Lab orders and baseline tests specific to treatment regimen  The treatment plan start date, drug sequencing, and pre-medications Prior authorization status  Patient's documented medication list, including drug-drug interaction screen and prescriptions for anti-emetics and supportive care specific to the treatment regimen The drug concentrations, fluid compatibility, administration routes, and timing of the medications to be used The patient's access for treatment and lifetime cumulative dose history, if applicable  The patient's medication allergies and previous infusion related reactions, if applicable   Changes made to treatment plan:  N/A  Follow up needed:  N/A   Stephens Shire, [facility], 11/30/2022  10:13 AM

## 2022-11-30 NOTE — Progress Notes (Signed)
Patient presents today for chemotherapy infusion. Patient is in satisfactory condition with no new complaints voiced.  Vital signs are stable.  Labs reviewed by Dr. Anders Simmonds during the office visit and all labs are within treatment parameters.  We will proceed with treatment per MD orders.   250 mL of urine collected prior to Cisplatin.

## 2022-11-30 NOTE — Progress Notes (Signed)
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  MRI brain discussed and was negative. -Labs reviewed today.  Patient is good to go for chemotherapy -Discussed the common adverse effects of treatment including nausea, vomiting, myelosuppression, renal dysfunction.  Patient feels well-informed about chemotherapy and radiation and is looking forward to start.  All questions and concerns were answered -Tentative plan to start radiation on November 1  Return to clinic in 3 weeks for next treatment    The patient understands the plans discussed today and is in agreement with them.  She knows to contact our office if she develops concerns prior to her next appointment.  I provided 20 minutes of face-to-face time during this encounter and > 50% was spent counseling as documented under my assessment and plan.    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 Growing solid nodule of the right lower lobe measuring 8 mm and new irregular solid nodule of the left lower lobe measuring 9.3 mm. Lung-RADS 4B, suspicious. Additional imaging evaluation or consultation with Pulmonology or Thoracic Surgery recommended.   09/09/2022 PET scan   IMPRESSION: 1. Hypermetabolic right hilar adenopathy and bilateral pulmonary nodules, findings indicative of synchronous primary bronchogenic carcinomas. No evidence of distant metastatic disease. 2. Vague slight thickening of lateral breast soft tissue with metabolism just below blood pool. Consider mammographic correlation, as clinically indicated. 3. Slight marginal irregularity of the liver raises suspicion for cirrhosis. 4. Bilateral adrenal adenomas.   10/02/2022 Imaging   CT chest without contrast: IMPRESSION: 1. No significant change in the appearance of tracer avid nodules within the medial right lower lobe and superior segment of left lower lobe. 2. Stable appearance of tracer avid nodal conglomeration noted within the right hilum. Which is concerning for nodal metastasis. 3. Stable appearance of bilateral adrenal adenomas. No follow-up imaging recommended.   10/04/2022 Pathology Results   A. LUNG, LLL, FINE NEEDLE ASPIRATION:  - No malignant cells identified  - Granulomatous inflammation   B. LUNG, LLL, BRUSHING:  - No malignant cells identified  - Granulomatous inflammation D. LUNG, RLL, FINE NEEDLE ASPIRATION:  - No malignant cells identified   E. LYMPH NODE, 11R, FINE NEEDLE ASPIRATION:  - Small cell carcinoma  - Lymphoid  tissue present    11/11/2022 Initial Diagnosis   Small cell carcinoma  metastatic to both lungs (HCC)   11/18/2022 PET scan   1. Interval enlargement of the superior segment left lower lobe lesion with stable hypermetabolism. 2. Stable 9 mm medial right lower lobe pulmonary nodule with slightly increased SUV max. 3. Persistent hypermetabolic right hilar adenopathy. 4. New 10 mm sub solid lesion in the left lower lobe with SUV max of 2.14. This is likely inflammatory. Attention on future studies is suggested. 5. Stable 14 mm linear density in the right lateral breast with low level FDG uptake. An indolent breast cancer is possible. 6. No findings for abdominal/pelvic metastatic disease or osseous metastatic disease. 7. Stable benign bilateral adrenal gland adenomas.   11/22/2022 Imaging   MRI Brain: No evidence of intracranial metastatic disease.     11/30/2022 -  Chemotherapy   Patient is on Treatment Plan : LUNG SMALL CELL Cisplatin (80) D1 + Etoposide (100) D1-3 q21d         Current Treatment: Chemo RT with cis/etoposide  INTERVAL HISTORY:  Virginia Powell is here today for follow up. Patient is accompanied by her husband. She denies fevers or chills. She denies pain. Her appetite is good.  She has no complaints today.  She continues to smoke a little less than 1 pack of cigarettes per day.  She feels well-informed about chemotherapy and radiation and is looking forward to start.   I have reviewed the past medical history, past surgical history, social history and family history with the patient and they are unchanged from previous note.  ALLERGIES:  is allergic to morphine and codeine.  MEDICATIONS:  Current Outpatient Medications  Medication Sig Dispense Refill   albuterol (PROVENTIL) (2.5 MG/3ML) 0.083% nebulizer solution SMARTSIG:1 Vial(s) Via Nebulizer Every 4-6 Hours PRN     albuterol (VENTOLIN HFA) 108 (90 Base) MCG/ACT inhaler SMARTSIG:2 Puff(s) By Mouth Every 4 Hours     atorvastatin (LIPITOR) 40 MG tablet Take 40 mg by mouth daily.      CISPLATIN IV Inject into the vein every 21 ( twenty-one) days.     ETOPOSIDE IV Inject into the vein every 21 ( twenty-one) days.     fexofenadine (ALLEGRA) 180 MG tablet Take 180 mg by mouth daily as needed for allergies or rhinitis.     fluticasone (FLONASE) 50 MCG/ACT nasal spray Place into both nostrils daily as needed.     furosemide (LASIX) 20 MG tablet Take 20 mg by mouth daily.     gabapentin (NEURONTIN) 300 MG capsule Take 600 mg by mouth at bedtime.     hydrochlorothiazide (HYDRODIURIL) 12.5 MG tablet Take 12.5 mg by mouth daily.     hydrOXYzine (ATARAX) 25 MG tablet Take 1 tablet by mouth at bedtime.     lidocaine-prilocaine (EMLA) cream Apply a quarter-sized amount to port a cath site and cover with plastic wrap 1 hour prior to infusion appointments 30 g 3   loratadine (CLARITIN) 10 MG tablet Take 10 mg by mouth daily.     olmesartan (BENICAR) 40 MG tablet Take 1 tablet (40 mg total) by mouth daily. 30 tablet 0   prochlorperazine (COMPAZINE) 10 MG tablet Take 1 tablet (10 mg total) by mouth every 6 (six) hours as needed. 60 tablet 2   rizatriptan (MAXALT-MLT) 10 MG disintegrating tablet Take 10 mg by mouth as needed for migraine. May repeat in 2 hours if needed     TRELEGY ELLIPTA 100-62.5-25 MCG/ACT AEPB Inhale  1 puff into the lungs daily.     No current facility-administered medications for this visit.   Facility-Administered Medications Ordered in Other Visits  Medication Dose Route Frequency Provider Last Rate Last Admin   0.9 %  sodium chloride infusion   Intravenous Continuous Cathe Bilger, MD       0.9 % NaCl with KCl 20 mEq/ L  infusion   Intravenous Once Cindie Crumbly, MD       CISplatin (PLATINOL) 100 mg in sodium chloride 0.9 % 500 mL chemo infusion  60 mg/m2 (Treatment Plan Recorded) Intravenous Once Cindie Crumbly, MD       dexamethasone (DECADRON) 10 mg in sodium chloride 0.9 % 50 mL IVPB  10 mg Intravenous Once Cindie Crumbly, MD       etoposide  (VEPESID) 152 mg in sodium chloride 0.9 % 500 mL chemo infusion  100 mg/m2 (Treatment Plan Recorded) Intravenous Once Cindie Crumbly, MD       fosaprepitant (EMEND) 150 mg in sodium chloride 0.9 % 145 mL IVPB  150 mg Intravenous Once Cindie Crumbly, MD       heparin lock flush 100 unit/mL  500 Units Intracatheter Once PRN Cindie Crumbly, MD       magnesium sulfate IVPB 2 g 50 mL  2 g Intravenous Once Cindie Crumbly, MD       palonosetron (ALOXI) injection 0.25 mg  0.25 mg Intravenous Once Cindie Crumbly, MD       sodium chloride flush (NS) 0.9 % injection 10 mL  10 mL Intracatheter PRN Cindie Crumbly, MD           REVIEW OF SYSTEMS:   Constitutional: Denies fevers, chills or abnormal weight loss Eyes: Denies blurriness of vision Ears, nose, mouth, throat, and face: Denies mucositis or sore throat Respiratory: Denies cough, dyspnea or wheezes Cardiovascular: Denies palpitation, chest discomfort or lower extremity swelling Gastrointestinal:  Denies nausea, heartburn or change in bowel habits Skin: Denies abnormal skin rashes Lymphatics: Denies new lymphadenopathy or easy bruising Neurological:Denies numbness, tingling or new weaknesses Behavioral/Psych: Mood is stable, no new changes  All other systems were reviewed with the patient and are negative.   VITALS:  Blood pressure 113/74, pulse 96, temperature (!) 97.5 F (36.4 C), temperature source Oral, resp. rate 18, weight 116 lb 3.2 oz (52.7 kg), SpO2 96%.  Wt Readings from Last 3 Encounters:  11/30/22 116 lb 3.2 oz (52.7 kg)  11/17/22 113 lb (51.3 kg)  11/11/22 112 lb 6.4 oz (51 kg)    Body mass index is 19.95 kg/m.  Performance status (ECOG): 1 - Symptomatic but completely ambulatory  PHYSICAL EXAM:   GENERAL:alert, no distress and comfortable SKIN: skin color, texture, turgor are normal, no rashes or significant lesions, port site clean LUNGS: clear to auscultation and percussion with normal breathing  effort HEART: regular rate & rhythm and no murmurs and no lower extremity edema ABDOMEN:abdomen soft, non-tender and normal bowel sounds Musculoskeletal:no cyanosis of digits and no clubbing  NEURO: alert & oriented x 3 with fluent speech,    LABORATORY DATA:  I have reviewed the data as listed    Component Value Date/Time   NA 134 (L) 11/30/2022 0819   K 3.5 11/30/2022 0819   CL 102 11/30/2022 0819   CO2 22 11/30/2022 0819   GLUCOSE 105 (H) 11/30/2022 0819   BUN 25 (H) 11/30/2022 0819   CREATININE 0.77 11/30/2022 0819   CALCIUM 8.8 (L) 11/30/2022 0819   PROT 6.5 11/30/2022 2956  ALBUMIN 3.9 11/30/2022 0819   AST 21 11/30/2022 0819   ALT 25 11/30/2022 0819   ALKPHOS 45 11/30/2022 0819   BILITOT 0.6 11/30/2022 0819   GFRNONAA >60 11/30/2022 0819    Lab Results  Component Value Date   WBC 8.0 11/30/2022   NEUTROABS 5.3 11/30/2022   HGB 13.1 11/30/2022   HCT 38.9 11/30/2022   MCV 96.5 11/30/2022   PLT 309 11/30/2022      Chemistry      Component Value Date/Time   NA 134 (L) 11/30/2022 0819   K 3.5 11/30/2022 0819   CL 102 11/30/2022 0819   CO2 22 11/30/2022 0819   BUN 25 (H) 11/30/2022 0819   CREATININE 0.77 11/30/2022 0819      Component Value Date/Time   CALCIUM 8.8 (L) 11/30/2022 0819   ALKPHOS 45 11/30/2022 0819   AST 21 11/30/2022 0819   ALT 25 11/30/2022 0819   BILITOT 0.6 11/30/2022 0819       RADIOGRAPHIC STUDIES: I have personally reviewed the radiological images as listed and agreed with the findings in the report. MR Brain W Wo Contrast  Result Date: 11/24/2022 CLINICAL DATA:  Small cell lung cancer (SCLC), staging. EXAM: MRI HEAD WITHOUT AND WITH CONTRAST TECHNIQUE: Multiplanar, multiecho pulse sequences of the brain and surrounding structures were obtained without and with intravenous contrast. CONTRAST:  5mL GADAVIST GADOBUTROL 1 MMOL/ML IV SOLN COMPARISON:  None Available. FINDINGS: Brain: No acute infarct or hemorrhage. 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 No hydrocephalus or extra-axial collection. No foci of abnormal susceptibility. No mass or abnormal enhancement. Vascular: Normal flow voids and vessel enhancement. Skull and upper cervical spine: Normal marrow signal and enhancement. Sinuses/Orbits: Unremarkable. Other: None. IMPRESSION: No evidence of intracranial metastatic disease. Electronically Signed   By: Orvan Falconer M.D.   On: 11/24/2022 14:36   NM PET Image Restag (PS) Skull Base To Thigh  Result Date: 11/19/2022 CLINICAL DATA:  Subsequent treatment strategy for small cell lung cancer. EXAM: NUCLEAR MEDICINE PET SKULL BASE TO THIGH TECHNIQUE: 6.09 mCi F-18 FDG was injected intravenously. Full-ring PET imaging was performed from the skull base to thigh after the radiotracer. CT data was obtained and used for attenuation correction and anatomic localization. Fasting blood glucose: 85 mg/dl COMPARISON:  PET-CT 44/04/4740 FINDINGS: Mediastinal blood pool activity: SUV max 2.0 Liver activity: SUV max NA NECK: No hypermetabolic lymph nodes in the neck. Incidental CT findings: None. CHEST: Persistent hypermetabolic right hilar adenopathy the. This appears stable in size measuring approximately the 2.5 cm. SUV max is 11.23 and was previously 11.5. Superior segment left lower lobe lesion with small fiducial noted this measures 19 x 14 mm and previously measured 14 x 14 mm. SUV max is 5.55 and was previously 5.0. Medial right lower lobe pulmonary nodule measures 9 mm (unchanged) and SUV max is 3.02. This was previously 2.4. New sub solid 10 mm lesion in the left lower lobe on image 15/203 has an SUV max is 2.14. This is likely inflammatory. 14 mm linear density in the right lateral breast has an SUV max of 1.85 and was previously 1.7. Indeterminate finding but a indolent breast cancer is possible. No supraclavicular or axillary adenopathy. Incidental CT  findings: Stable vascular calcifications. The Port-A-Cath is in good position without complicating features. ABDOMEN/PELVIS: No abnormal hypermetabolic activity within the liver, pancreas, adrenal glands, or spleen. No hypermetabolic lymph nodes in the abdomen or pelvis. Incidental CT findings:  Stable benign bilateral adrenal gland adenomas and simple renal cyst not requiring any further imaging evaluation or follow-up. Stable vascular calcifications. Stable advanced sigmoid colon diverticulosis. SKELETON: No findings suspicious for osseous metastatic disease. Incidental CT findings: None. IMPRESSION: 1. Interval enlargement of the superior segment left lower lobe lesion with stable hypermetabolism. 2. Stable 9 mm medial right lower lobe pulmonary nodule with slightly increased SUV max. 3. Persistent hypermetabolic right hilar adenopathy. 4. New 10 mm sub solid lesion in the left lower lobe with SUV max of 2.14. This is likely inflammatory. Attention on future studies is suggested. 5. Stable 14 mm linear density in the right lateral breast with low level FDG uptake. An indolent breast cancer is possible. 6. No findings for abdominal/pelvic metastatic disease or osseous metastatic disease. 7. Stable benign bilateral adrenal gland adenomas. Electronically Signed   By: Rudie Meyer M.D.   On: 11/19/2022 09:42   IR IMAGING GUIDED PORT INSERTION  Result Date: 11/17/2022 INDICATION: History of lung cancer. In need of durable intravenous access for chemotherapy administration. EXAM: IMPLANTED PORT A CATH PLACEMENT WITH ULTRASOUND AND FLUOROSCOPIC GUIDANCE COMPARISON:  None Available. MEDICATIONS: None ANESTHESIA/SEDATION: Moderate (conscious) sedation was employed during this procedure as administered by the Interventional Radiology RN. A total of Versed 2 mg and Fentanyl 100 mcg was administered intravenously. Moderate Sedation Time: 21 minutes. The patient's level of consciousness and vital signs were monitored  continuously by radiology nursing throughout the procedure under my direct supervision. CONTRAST:  None FLUOROSCOPY TIME:  24 seconds (1.8 mGy) COMPLICATIONS: None immediate. PROCEDURE: The procedure, risks, benefits, and alternatives were explained to the patient. Questions regarding the procedure were encouraged and answered. The patient understands and consents to the procedure. The right neck and chest were prepped with chlorhexidine in a sterile fashion, and a sterile drape was applied covering the operative field. Maximum barrier sterile technique with sterile gowns and gloves were used for the procedure. A timeout was performed prior to the initiation of the procedure. Local anesthesia was provided with 1% lidocaine with epinephrine. After creating a small venotomy incision, a micropuncture kit was utilized to access the internal jugular vein. Real-time ultrasound guidance was utilized for vascular access including the acquisition of a permanent ultrasound image documenting patency of the accessed vessel. The microwire was utilized to measure appropriate catheter length. A subcutaneous port pocket was then created along the upper chest wall utilizing a combination of sharp and blunt dissection. The pocket was irrigated with sterile saline. A single lumen [employer] power injectable port was chosen for placement. The 8 Fr catheter was tunneled from the port pocket site to the venotomy incision. The port was placed in the pocket. The external catheter was trimmed to appropriate length. At the venotomy, an 8 Fr peel-away sheath was placed over a guidewire under fluoroscopic guidance. The catheter was then placed through the sheath and the sheath was removed. Final catheter positioning was confirmed and documented with a fluoroscopic spot radiograph. The port was accessed with a Huber needle, aspirated and flushed with heparinized saline. The venotomy site was closed with an interrupted 4-0 Vicryl suture. The  port pocket incision was closed with interrupted 2-0 Vicryl suture. Dermabond and Steri-strips were applied to both incisions. Dressings were applied. The patient tolerated the procedure well without immediate post procedural complication. FINDINGS: After catheter placement, the tip lies within the superior cavoatrial junction. The catheter aspirates and flushes normally and is ready for immediate use. IMPRESSION: Successful placement of a right internal jugular  approach power injectable Port-A-Cath. The catheter is ready for immediate use. Electronically Signed   By: Simonne Come M.D.   On: 11/17/2022 12:42

## 2022-11-30 NOTE — Patient Instructions (Signed)
[facility]  Discharge Instructions: Thank you for choosing [facility] to provide your oncology and hematology care.  If you have a lab appointment with the Cancer Center - please note that after April 8th, 2024, all labs will be drawn in the cancer center.  You do not have to check in or register with the main entrance as you have in the past but will complete your check-in in the cancer center.  Wear comfortable clothing and clothing appropriate for easy access to any Portacath or PICC line.   We strive to give you quality time with your provider. You may need to reschedule your appointment if you arrive late (15 or more minutes).  Arriving late affects you and other patients whose appointments are after yours.  Also, if you miss three or more appointments without notifying the office, you may be dismissed from the clinic at the provider's discretion.      For prescription refill requests, have your pharmacy contact our office and allow 72 hours for refills to be completed.    Today you received the following chemotherapy and/or immunotherapy agents Cisplatin/Etoposide.  Cisplatin Injection What is this medication? CISPLATIN (SIS pla tin) treats some types of cancer. It works by slowing down the growth of cancer cells. This medicine may be used for other purposes; ask your health care provider or pharmacist if you have questions. COMMON BRAND NAME(S): Platinol, Platinol -AQ What should I tell my care team before I take this medication? They need to know if you have any of these conditions: Eye disease, vision problems Hearing problems Kidney disease Low blood counts, such as low white cells, platelets, or red blood cells Tingling of the fingers or toes, or other nerve disorder An unusual or allergic reaction to cisplatin, carboplatin, oxaliplatin, other medications, foods, dyes, or preservatives If you or your partner are pregnant or trying to get  pregnant Breast-feeding How should I use this medication? This medication is injected into a vein. It is given by your care team in a hospital or clinic setting. Talk to your care team about the use of this medication in children. Special care may be needed. Overdosage: If you think you have taken too much of this medicine contact a poison control center or emergency room at once. NOTE: This medicine is only for you. Do not share this medicine with others. What if I miss a dose? Keep appointments for follow-up doses. It is important not to miss your dose. Call your care team if you are unable to keep an appointment. What may interact with this medication? Do not take this medication with any of the following: Live virus vaccines This medication may also interact with the following: Certain antibiotics, such as amikacin, gentamicin, neomycin, polymyxin B, streptomycin, tobramycin, vancomycin Foscarnet This list may not describe all possible interactions. Give your health care provider a list of all the medicines, herbs, non-prescription drugs, or dietary supplements you use. Also tell them if you smoke, drink alcohol, or use illegal drugs. Some items may interact with your medicine. What should I watch for while using this medication? Your condition will be monitored carefully while you are receiving this medication. You may need blood work done while taking this medication. This medication may make you feel generally unwell. This is not uncommon, as chemotherapy can affect healthy cells as well as cancer cells. Report any side effects. Continue your course of treatment even though you feel ill unless your care team  tells you to stop. This medication may increase your risk of getting an infection. Call your care team for advice if you get a fever, chills, sore throat, or other symptoms of a cold or flu. Do not treat yourself. Try to avoid being around people who are sick. Avoid taking medications  that contain aspirin, acetaminophen, ibuprofen, naproxen, or ketoprofen unless instructed by your care team. These medications may hide a fever. This medication may increase your risk to bruise or bleed. Call your care team if you notice any unusual bleeding. Be careful brushing or flossing your teeth or using a toothpick because you may get an infection or bleed more easily. If you have any dental work done, tell your dentist you are receiving this medication. Drink fluids as directed while you are taking this medication. This will help protect your kidneys. Call your care team if you get diarrhea. Do not treat yourself. Talk to your care team if you or your partner wish to become pregnant or think you might be pregnant. This medication can cause serious birth defects if taken during pregnancy and for 14 months after the last dose. A negative pregnancy test is required before starting this medication. A reliable form of contraception is recommended while taking this medication and for 14 months after the last dose. Talk to your care team about effective forms of contraception. Do not father a child while taking this medication and for 11 months after the last dose. Use a condom during sex during this time period. Do not breast-feed while taking this medication. This medication may cause infertility. Talk to your care team if you are concerned about your fertility. What side effects may I notice from receiving this medication? Side effects that you should report to your care team as soon as possible: Allergic reactions--skin rash, itching, hives, swelling of the face, lips, tongue, or throat Eye pain, change in vision, vision loss Hearing loss, ringing in ears Infection--fever, chills, cough, sore throat, wounds that don't heal, pain or trouble when passing urine, general feeling of discomfort or being unwell Kidney injury--decrease in the amount of urine, swelling of the ankles, hands, or feet Low  red blood cell level--unusual weakness or fatigue, dizziness, headache, trouble breathing Painful swelling, warmth, or redness of the skin, blisters or sores at the infusion site Pain, tingling, or numbness in the hands or feet Unusual bruising or bleeding Side effects that usually do not require medical attention (report to your care team if they continue or are bothersome): Hair loss Nausea Vomiting This list may not describe all possible side effects. Call your doctor for medical advice about side effects. You may report side effects to FDA at 1-800-FDA-1088. Where should I keep my medication? This medication is given in a hospital or clinic. It will not be stored at home. NOTE: This sheet is a summary. It may not cover all possible information. If you have questions about this medicine, talk to your doctor, pharmacist, or health care provider.  2024 Elsevier/Gold Standard (2021-06-05 00:00:00)    Etoposide Injection What is this medication? ETOPOSIDE (e toe POE side) treats some types of cancer. It works by slowing down the growth of cancer cells. This medicine may be used for other purposes; ask your health care provider or pharmacist if you have questions. COMMON BRAND NAME(S): Etopophos, Toposar, VePesid What should I tell my care team before I take this medication? They need to know if you have any of these conditions: Infection Kidney disease Liver  disease Low blood counts, such as low white cell, platelet, red cell counts An unusual or allergic reaction to etoposide, other medications, foods, dyes, or preservatives If you or your partner are pregnant or trying to get pregnant Breastfeeding How should I use this medication? This medication is injected into a vein. It is given by your care team in a hospital or clinic setting. Talk to your care team about the use of this medication in children. Special care may be needed. Overdosage: If you think you have taken too much of  this medicine contact a poison control center or emergency room at once. NOTE: This medicine is only for you. Do not share this medicine with others. What if I miss a dose? Keep appointments for follow-up doses. It is important not to miss your dose. Call your care team if you are unable to keep an appointment. What may interact with this medication? Warfarin This list may not describe all possible interactions. Give your health care provider a list of all the medicines, herbs, non-prescription drugs, or dietary supplements you use. Also tell them if you smoke, drink alcohol, or use illegal drugs. Some items may interact with your medicine. What should I watch for while using this medication? Your condition will be monitored carefully while you are receiving this medication. This medication may make you feel generally unwell. This is not uncommon as chemotherapy can affect healthy cells as well as cancer cells. Report any side effects. Continue your course of treatment even though you feel ill unless your care team tells you to stop. This medication can cause serious side effects. To reduce the risk, your care team may give you other medications to take before receiving this one. Be sure to follow the directions from your care team. This medication may increase your risk of getting an infection. Call your care team for advice if you get a fever, chills, sore throat, or other symptoms of a cold or flu. Do not treat yourself. Try to avoid being around people who are sick. This medication may increase your risk to bruise or bleed. Call your care team if you notice any unusual bleeding. Talk to your care team about your risk of cancer. You may be more at risk for certain types of cancers if you take this medication. Talk to your care team if you may be pregnant. Serious birth defects can occur if you take this medication during pregnancy and for 6 months after the last dose. You will need a negative  pregnancy test before starting this medication. Contraception is recommended while taking this medication and for 6 months after the last dose. Your care team can help you find the option that works for you. If your partner can get pregnant, use a condom during sex while taking this medication and for 4 months after the last dose. Do not breastfeed while taking this medication. This medication may cause infertility. Talk to your care team if you are concerned about your fertility. What side effects may I notice from receiving this medication? Side effects that you should report to your care team as soon as possible: Allergic reactions--skin rash, itching, hives, swelling of the face, lips, tongue, or throat Infection--fever, chills, cough, sore throat, wounds that don't heal, pain or trouble when passing urine, general feeling of discomfort or being unwell Low red blood cell level--unusual weakness or fatigue, dizziness, headache, trouble breathing Unusual bruising or bleeding Side effects that usually do not require medical attention (report to your  care team if they continue or are bothersome): Diarrhea Fatigue Hair loss Loss of appetite Nausea Vomiting This list may not describe all possible side effects. Call your doctor for medical advice about side effects. You may report side effects to FDA at 1-800-FDA-1088. Where should I keep my medication? This medication is given in a hospital or clinic. It will not be stored at home. NOTE: This sheet is a summary. It may not cover all possible information. If you have questions about this medicine, talk to your doctor, pharmacist, or health care provider.  2024 Elsevier/Gold Standard (2021-06-25 00:00:00)        To help prevent nausea and vomiting after your treatment, we encourage you to take your nausea medication as directed.  BELOW ARE SYMPTOMS THAT SHOULD BE REPORTED IMMEDIATELY: *FEVER GREATER THAN 100.4 F (38 C) OR HIGHER *CHILLS OR  SWEATING *NAUSEA AND VOMITING THAT IS NOT CONTROLLED WITH YOUR NAUSEA MEDICATION *UNUSUAL SHORTNESS OF BREATH *UNUSUAL BRUISING OR BLEEDING *URINARY PROBLEMS (pain or burning when urinating, or frequent urination) *BOWEL PROBLEMS (unusual diarrhea, constipation, pain near the anus) TENDERNESS IN MOUTH AND THROAT WITH OR WITHOUT PRESENCE OF ULCERS (sore throat, sores in mouth, or a toothache) UNUSUAL RASH, SWELLING OR PAIN  UNUSUAL VAGINAL DISCHARGE OR ITCHING   Items with * indicate a potential emergency and should be followed up as soon as possible or go to the Emergency Department if any problems should occur.  Please show the CHEMOTHERAPY ALERT CARD or IMMUNOTHERAPY ALERT CARD at check-in to the Emergency Department and triage nurse.  Should you have questions after your visit or need to cancel or reschedule your appointment, please contact [facility] [phone]  and follow the prompts.  Office hours are 8:00 a.m. to 4:30 p.m. Monday - Friday. Please note that voicemails left after 4:00 p.m. may not be returned until the following business day.  We are closed weekends and major holidays. You have access to a nurse at all times for urgent questions. Please call the main number to the clinic [phone] and follow the prompts.  For any non-urgent questions, you may also contact your provider using MyChart. We now offer e-Visits for anyone 33 and older to request care online for non-urgent symptoms. For details visit [email or web address].   Also download the MyChart app! Go to the app store, search "MyChart", open the app, select [place], and log in with your MyChart username and password.

## 2022-11-30 NOTE — Assessment & Plan Note (Addendum)
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  MRI brain discussed and was negative. -Labs reviewed today.  Patient is good to go for chemotherapy -Discussed the common adverse effects of treatment including nausea, vomiting, myelosuppression, renal dysfunction.  Patient feels well-informed about chemotherapy and radiation and is looking forward to start.  All questions and concerns were answered -Tentative plan to start radiation on November 1  Return to clinic in 3 weeks for next treatment

## 2022-11-30 NOTE — Assessment & Plan Note (Addendum)
-  Patient is an avid smoker with history of smoking 2 packs/day until recently -Patient continues to smoke -Discussed the risks of smoking including faster progression of cancer, inflammation causing difficulties with chemotherapy and radiation -Offered help to quit smoking, patient is reluctant at this time

## 2022-11-30 NOTE — Progress Notes (Signed)
Treatment given today per MD orders. Tolerated infusion without adverse affects. Vital signs stable. No complaints at this time. Discharged from clinic ambulatory in stable condition. Alert and oriented x 3. F/U with [facility] as scheduled.

## 2022-12-01 ENCOUNTER — Inpatient Hospital Stay: Payer: Medicare HMO

## 2022-12-01 VITALS — BP 117/74 | HR 78 | Temp 97.0°F | Resp 18

## 2022-12-01 DIAGNOSIS — C3490 Malignant neoplasm of unspecified part of unspecified bronchus or lung: Secondary | ICD-10-CM | POA: Diagnosis not present

## 2022-12-01 DIAGNOSIS — C7801 Secondary malignant neoplasm of right lung: Secondary | ICD-10-CM | POA: Diagnosis not present

## 2022-12-01 DIAGNOSIS — F17210 Nicotine dependence, cigarettes, uncomplicated: Secondary | ICD-10-CM | POA: Diagnosis not present

## 2022-12-01 DIAGNOSIS — Z5111 Encounter for antineoplastic chemotherapy: Secondary | ICD-10-CM | POA: Diagnosis not present

## 2022-12-01 DIAGNOSIS — C7802 Secondary malignant neoplasm of left lung: Secondary | ICD-10-CM

## 2022-12-01 DIAGNOSIS — Z5189 Encounter for other specified aftercare: Secondary | ICD-10-CM | POA: Diagnosis not present

## 2022-12-01 MED ORDER — SODIUM CHLORIDE 0.9% FLUSH
10.0000 mL | INTRAVENOUS | Status: DC | PRN
  Administered 2022-12-01: 10 mL

## 2022-12-01 MED ORDER — SODIUM CHLORIDE 0.9 % IV SOLN
10.0000 mg | Freq: Once | INTRAVENOUS | Status: AC
  Administered 2022-12-01: 10 mg via INTRAVENOUS
  Filled 2022-12-01: qty 10

## 2022-12-01 MED ORDER — SODIUM CHLORIDE 0.9 % IV SOLN
100.0000 mg/m2 | Freq: Once | INTRAVENOUS | Status: AC
  Administered 2022-12-01: 152 mg via INTRAVENOUS
  Filled 2022-12-01: qty 7.6

## 2022-12-01 MED ORDER — SODIUM CHLORIDE 0.9 % IV SOLN: INTRAVENOUS | Status: DC

## 2022-12-01 MED ORDER — HEPARIN SOD (PORK) LOCK FLUSH 100 UNIT/ML IV SOLN
500.0000 [IU] | Freq: Once | INTRAVENOUS | Status: AC | PRN
  Administered 2022-12-01: 500 [IU]

## 2022-12-01 NOTE — Patient Instructions (Signed)
[facility]  Discharge Instructions: Thank you for choosing [facility] to provide your oncology and hematology care.  If you have a lab appointment with the Cancer Center - please note that after April 8th, 2024, all labs will be drawn in the cancer center.  You do not have to check in or register with the main entrance as you have in the past but will complete your check-in in the cancer center.  Wear comfortable clothing and clothing appropriate for easy access to any Portacath or PICC line.   We strive to give you quality time with your provider. You may need to reschedule your appointment if you arrive late (15 or more minutes).  Arriving late affects you and other patients whose appointments are after yours.  Also, if you miss three or more appointments without notifying the office, you may be dismissed from the clinic at the provider's discretion.      For prescription refill requests, have your pharmacy contact our office and allow 72 hours for refills to be completed.    Today you received the following chemotherapy and/or immunotherapy agents Etoposide.  Etoposide Injection What is this medication? ETOPOSIDE (e toe POE side) treats some types of cancer. It works by slowing down the growth of cancer cells. This medicine may be used for other purposes; ask your health care provider or pharmacist if you have questions. COMMON BRAND NAME(S): Etopophos, Toposar, VePesid What should I tell my care team before I take this medication? They need to know if you have any of these conditions: Infection Kidney disease Liver disease Low blood counts, such as low white cell, platelet, red cell counts An unusual or allergic reaction to etoposide, other medications, foods, dyes, or preservatives If you or your partner are pregnant or trying to get pregnant Breastfeeding How should I use this medication? This medication is injected into a vein. It is given by  your care team in a hospital or clinic setting. Talk to your care team about the use of this medication in children. Special care may be needed. Overdosage: If you think you have taken too much of this medicine contact a poison control center or emergency room at once. NOTE: This medicine is only for you. Do not share this medicine with others. What if I miss a dose? Keep appointments for follow-up doses. It is important not to miss your dose. Call your care team if you are unable to keep an appointment. What may interact with this medication? Warfarin This list may not describe all possible interactions. Give your health care provider a list of all the medicines, herbs, non-prescription drugs, or dietary supplements you use. Also tell them if you smoke, drink alcohol, or use illegal drugs. Some items may interact with your medicine. What should I watch for while using this medication? Your condition will be monitored carefully while you are receiving this medication. This medication may make you feel generally unwell. This is not uncommon as chemotherapy can affect healthy cells as well as cancer cells. Report any side effects. Continue your course of treatment even though you feel ill unless your care team tells you to stop. This medication can cause serious side effects. To reduce the risk, your care team may give you other medications to take before receiving this one. Be sure to follow the directions from your care team. This medication may increase your risk of getting an infection. Call your care team for advice if you get  a fever, chills, sore throat, or other symptoms of a cold or flu. Do not treat yourself. Try to avoid being around people who are sick. This medication may increase your risk to bruise or bleed. Call your care team if you notice any unusual bleeding. Talk to your care team about your risk of cancer. You may be more at risk for certain types of cancers if you take this  medication. Talk to your care team if you may be pregnant. Serious birth defects can occur if you take this medication during pregnancy and for 6 months after the last dose. You will need a negative pregnancy test before starting this medication. Contraception is recommended while taking this medication and for 6 months after the last dose. Your care team can help you find the option that works for you. If your partner can get pregnant, use a condom during sex while taking this medication and for 4 months after the last dose. Do not breastfeed while taking this medication. This medication may cause infertility. Talk to your care team if you are concerned about your fertility. What side effects may I notice from receiving this medication? Side effects that you should report to your care team as soon as possible: Allergic reactions--skin rash, itching, hives, swelling of the face, lips, tongue, or throat Infection--fever, chills, cough, sore throat, wounds that don't heal, pain or trouble when passing urine, general feeling of discomfort or being unwell Low red blood cell level--unusual weakness or fatigue, dizziness, headache, trouble breathing Unusual bruising or bleeding Side effects that usually do not require medical attention (report to your care team if they continue or are bothersome): Diarrhea Fatigue Hair loss Loss of appetite Nausea Vomiting This list may not describe all possible side effects. Call your doctor for medical advice about side effects. You may report side effects to FDA at 1-800-FDA-1088. Where should I keep my medication? This medication is given in a hospital or clinic. It will not be stored at home. NOTE: This sheet is a summary. It may not cover all possible information. If you have questions about this medicine, talk to your doctor, pharmacist, or health care provider.  2024 Elsevier/Gold Standard (2021-06-25 00:00:00)        To help prevent nausea and vomiting  after your treatment, we encourage you to take your nausea medication as directed.  BELOW ARE SYMPTOMS THAT SHOULD BE REPORTED IMMEDIATELY: *FEVER GREATER THAN 100.4 F (38 C) OR HIGHER *CHILLS OR SWEATING *NAUSEA AND VOMITING THAT IS NOT CONTROLLED WITH YOUR NAUSEA MEDICATION *UNUSUAL SHORTNESS OF BREATH *UNUSUAL BRUISING OR BLEEDING *URINARY PROBLEMS (pain or burning when urinating, or frequent urination) *BOWEL PROBLEMS (unusual diarrhea, constipation, pain near the anus) TENDERNESS IN MOUTH AND THROAT WITH OR WITHOUT PRESENCE OF ULCERS (sore throat, sores in mouth, or a toothache) UNUSUAL RASH, SWELLING OR PAIN  UNUSUAL VAGINAL DISCHARGE OR ITCHING   Items with * indicate a potential emergency and should be followed up as soon as possible or go to the Emergency Department if any problems should occur.  Please show the CHEMOTHERAPY ALERT CARD or IMMUNOTHERAPY ALERT CARD at check-in to the Emergency Department and triage nurse.  Should you have questions after your visit or need to cancel or reschedule your appointment, please contact [facility] [phone]  and follow the prompts.  Office hours are 8:00 a.m. to 4:30 p.m. Monday - Friday. Please note that voicemails left after 4:00 p.m. may not be returned until the following business day.  We are closed weekends and major holidays. You have access to a nurse at all times for urgent questions. Please call the main number to the clinic [phone] and follow the prompts.  For any non-urgent questions, you may also contact your provider using MyChart. We now offer e-Visits for anyone 73 and older to request care online for non-urgent symptoms. For details visit [email or web address].   Also download the MyChart app! Go to the app store, search "MyChart", open the app, select [place], and log in with your MyChart username and password.

## 2022-12-01 NOTE — Progress Notes (Signed)
Patient presents today for D2C1 Etoposide infusion. Vital signs within parameters for treatment. Patient denies any side effects related to treatment yesterday. Patient has no complaints today.   Treatment given today per MD orders. Tolerated infusion without adverse affects. Vital signs stable. No complaints at this time. Discharged from clinic ambulatory in stable condition. Alert and oriented x 3. F/U with [facility] as scheduled.

## 2022-12-02 ENCOUNTER — Encounter: Payer: Self-pay | Admitting: Oncology

## 2022-12-02 ENCOUNTER — Ambulatory Visit: Payer: Medicare HMO

## 2022-12-02 ENCOUNTER — Inpatient Hospital Stay: Payer: Medicare HMO | Admitting: Dietician

## 2022-12-02 ENCOUNTER — Inpatient Hospital Stay: Payer: Medicare HMO

## 2022-12-02 VITALS — BP 148/70 | HR 63 | Temp 97.3°F | Resp 20

## 2022-12-02 DIAGNOSIS — C7801 Secondary malignant neoplasm of right lung: Secondary | ICD-10-CM | POA: Diagnosis not present

## 2022-12-02 DIAGNOSIS — Z780 Asymptomatic menopausal state: Secondary | ICD-10-CM | POA: Diagnosis not present

## 2022-12-02 DIAGNOSIS — Z510 Encounter for antineoplastic radiation therapy: Secondary | ICD-10-CM | POA: Diagnosis not present

## 2022-12-02 DIAGNOSIS — C3490 Malignant neoplasm of unspecified part of unspecified bronchus or lung: Secondary | ICD-10-CM | POA: Diagnosis not present

## 2022-12-02 DIAGNOSIS — C3480 Malignant neoplasm of overlapping sites of unspecified bronchus and lung: Secondary | ICD-10-CM | POA: Diagnosis not present

## 2022-12-02 DIAGNOSIS — I10 Essential (primary) hypertension: Secondary | ICD-10-CM | POA: Diagnosis not present

## 2022-12-02 DIAGNOSIS — C771 Secondary and unspecified malignant neoplasm of intrathoracic lymph nodes: Secondary | ICD-10-CM | POA: Diagnosis not present

## 2022-12-02 DIAGNOSIS — C7802 Secondary malignant neoplasm of left lung: Secondary | ICD-10-CM | POA: Diagnosis not present

## 2022-12-02 DIAGNOSIS — Z5111 Encounter for antineoplastic chemotherapy: Secondary | ICD-10-CM | POA: Diagnosis not present

## 2022-12-02 DIAGNOSIS — F17210 Nicotine dependence, cigarettes, uncomplicated: Secondary | ICD-10-CM | POA: Diagnosis not present

## 2022-12-02 DIAGNOSIS — R928 Other abnormal and inconclusive findings on diagnostic imaging of breast: Secondary | ICD-10-CM | POA: Diagnosis not present

## 2022-12-02 DIAGNOSIS — Z5189 Encounter for other specified aftercare: Secondary | ICD-10-CM | POA: Diagnosis not present

## 2022-12-02 DIAGNOSIS — J449 Chronic obstructive pulmonary disease, unspecified: Secondary | ICD-10-CM | POA: Diagnosis not present

## 2022-12-02 MED ORDER — SODIUM CHLORIDE 0.9 % IV SOLN
100.0000 mg/m2 | Freq: Once | INTRAVENOUS | Status: AC
  Administered 2022-12-02: 152 mg via INTRAVENOUS
  Filled 2022-12-02: qty 7.6

## 2022-12-02 MED ORDER — HEPARIN SOD (PORK) LOCK FLUSH 100 UNIT/ML IV SOLN
500.0000 [IU] | Freq: Once | INTRAVENOUS | Status: AC | PRN
  Administered 2022-12-02: 500 [IU]

## 2022-12-02 MED ORDER — SODIUM CHLORIDE 0.9 % IV SOLN: INTRAVENOUS | Status: DC

## 2022-12-02 MED ORDER — SODIUM CHLORIDE 0.9 % IV SOLN
10.0000 mg | Freq: Once | INTRAVENOUS | Status: AC
  Administered 2022-12-02: 10 mg via INTRAVENOUS
  Filled 2022-12-02: qty 10

## 2022-12-02 NOTE — Progress Notes (Signed)
Stable during infusion without adverse affects.  Vital signs stable.  No complaints at this time.  Discharge from clinic ambulatory in stable condition.  Alert and oriented X 3.  Follow up with [facility] as scheduled.

## 2022-12-02 NOTE — Progress Notes (Signed)
Nutrition Assessment  Reason for Assessment: Referral    ASSESSMENT: 72 year old female with recently diagnosed stage IV small cell lung cancer. Planning for concurrent chemoradiation. Patient received first cisplatin/etoposide 10/15. Radiation to start 11/1. Patient is under the care of Dr. Anders Simmonds  Past medical history includes COPD, psoriasis, HLD  Met with patient in infusion. She reports no change in appetite. Patient typically eats 2 meals. She has been drinking 1-2 Ensure Original. She does not like Ensure Plus. "It taste like protein." Patient reports including good sources of protein at meals. Yesterday pt drank ensure for breakfast, had spaghetti for lunch. Salmon steak, asparagus, potato for dinner. Patient denies nausea, vomiting, diarrhea, constipation.   Nutrition Focused Physical Exam: deferred    Medications: lasix 20mg , gabapentin, atarax, hydrochlorothiazide, compazine   Labs: Na 134, BUN 25   Anthropometrics:   Height: 5'4" Weight: 116 lb 3.2 oz  UBW: 100-105 lb (per pt) BMI: 19.95   NUTRITION DIAGNOSIS: Food and nutrition related knowledge deficit related to newly diagnosed cancer as evidenced by no prior need for associated nutrition information    INTERVENTION:  Educated on importance of adequate calorie and protein energy intake to maintain strength/weights Encouraged high calorie high protein snacks in between meals - handout provided Suggested soft moist foods for ease of intake - handout provided  Continue drinking 1-2 Ensure - coupons provided   MONITORING, EVALUATION, GOAL: Patient will tolerate adequate calories and protein to minimize wt loss during treatment    Next Visit: Monday November 4 during infusion

## 2022-12-02 NOTE — Patient Instructions (Signed)
[facility] [place]  Discharge Instructions: Thank you for choosing [facility] to provide your oncology and hematology care.  If you have a lab appointment with the Cancer Center - please note that after April 8th, 2024, all labs will be drawn in the cancer center.  You do not have to check in or register with the main entrance as you have in the past but will complete your check-in in the cancer center.  Wear comfortable clothing and clothing appropriate for easy access to any Portacath or PICC line.   We strive to give you quality time with your provider. You may need to reschedule your appointment if you arrive late (15 or more minutes).  Arriving late affects you and other patients whose appointments are after yours.  Also, if you miss three or more appointments without notifying the office, you may be dismissed from the clinic at the provider's discretion.      For prescription refill requests, have your pharmacy contact our office and allow 72 hours for refills to be completed.    Today you received the following chemotherapy and/or immunotherapy agents Eptoside   To help prevent nausea and vomiting after your treatment, we encourage you to take your nausea medication as directed.  BELOW ARE SYMPTOMS THAT SHOULD BE REPORTED IMMEDIATELY: *FEVER GREATER THAN 100.4 F (38 C) OR HIGHER *CHILLS OR SWEATING *NAUSEA AND VOMITING THAT IS NOT CONTROLLED WITH YOUR NAUSEA MEDICATION *UNUSUAL SHORTNESS OF BREATH *UNUSUAL BRUISING OR BLEEDING *URINARY PROBLEMS (pain or burning when urinating, or frequent urination) *BOWEL PROBLEMS (unusual diarrhea, constipation, pain near the anus) TENDERNESS IN MOUTH AND THROAT WITH OR WITHOUT PRESENCE OF ULCERS (sore throat, sores in mouth, or a toothache) UNUSUAL RASH, SWELLING OR PAIN  UNUSUAL VAGINAL DISCHARGE OR ITCHING   Items with * indicate a potential emergency and should be followed up as soon as possible or go to the  Emergency Department if any problems should occur.  Please show the CHEMOTHERAPY ALERT CARD or IMMUNOTHERAPY ALERT CARD at check-in to the Emergency Department and triage nurse.  Should you have questions after your visit or need to cancel or reschedule your appointment, please contact [facility] : [facility] AT [facility] [phone]  and follow the prompts.  Office hours are 8:00 a.m. to 4:30 p.m. Monday - Friday. Please note that voicemails left after 4:00 p.m. may not be returned until the following business day.  We are closed weekends and major holidays. You have access to a nurse at all times for urgent questions. Please call the main number to the clinic [phone] and follow the prompts.  For any non-urgent questions, you may also contact your provider using MyChart. We now offer e-Visits for anyone 34 and older to request care online for non-urgent symptoms. For details visit [email or web address].   Also download the MyChart app! Go to the app store, search "MyChart", open the app, select [facility], and log in with your MyChart username and password.

## 2022-12-03 ENCOUNTER — Inpatient Hospital Stay: Payer: Medicare HMO

## 2022-12-03 VITALS — BP 159/102 | HR 73 | Temp 97.4°F | Resp 18

## 2022-12-03 DIAGNOSIS — C7801 Secondary malignant neoplasm of right lung: Secondary | ICD-10-CM | POA: Diagnosis not present

## 2022-12-03 DIAGNOSIS — F17210 Nicotine dependence, cigarettes, uncomplicated: Secondary | ICD-10-CM | POA: Diagnosis not present

## 2022-12-03 DIAGNOSIS — C3490 Malignant neoplasm of unspecified part of unspecified bronchus or lung: Secondary | ICD-10-CM | POA: Diagnosis not present

## 2022-12-03 DIAGNOSIS — C7802 Secondary malignant neoplasm of left lung: Secondary | ICD-10-CM | POA: Diagnosis not present

## 2022-12-03 DIAGNOSIS — Z5111 Encounter for antineoplastic chemotherapy: Secondary | ICD-10-CM | POA: Diagnosis not present

## 2022-12-03 DIAGNOSIS — Z5189 Encounter for other specified aftercare: Secondary | ICD-10-CM | POA: Diagnosis not present

## 2022-12-03 MED ORDER — PEGFILGRASTIM-JMDB 6 MG/0.6ML ~~LOC~~ SOSY
6.0000 mg | PREFILLED_SYRINGE | Freq: Once | SUBCUTANEOUS | Status: AC
  Administered 2022-12-03: 6 mg via SUBCUTANEOUS
  Filled 2022-12-03: qty 0.6

## 2022-12-03 NOTE — Patient Instructions (Signed)
[facility] [place]  Discharge Instructions: Thank you for choosing [facility] to provide your oncology and hematology care.  If you have a lab appointment with the Cancer Center - please note that after April 8th, 2024, all labs will be drawn in the cancer center.  You do not have to check in or register with the main entrance as you have in the past but will complete your check-in in the cancer center.  Wear comfortable clothing and clothing appropriate for easy access to any Portacath or PICC line.   We strive to give you quality time with your provider. You may need to reschedule your appointment if you arrive late (15 or more minutes).  Arriving late affects you and other patients whose appointments are after yours.  Also, if you miss three or more appointments without notifying the office, you may be dismissed from the clinic at the provider's discretion.      For prescription refill requests, have your pharmacy contact our office and allow 72 hours for refills to be completed.    Today you received the following chemotherapy and/or immunotherapy agents Fulphila.  Pegfilgrastim Injection What is this medication? PEGFILGRASTIM (PEG fil gra stim) lowers the risk of infection in people who are receiving chemotherapy. It works by Systems analyst make more white blood cells, which protects your body from infection. It may also be used to help people who have been exposed to high doses of radiation. This medicine may be used for other purposes; ask your health care provider or pharmacist if you have questions. COMMON BRAND NAME(S): Cherly Hensen, Neulasta, Nyvepria, Stimufend, UDENYCA, UDENYCA ONBODY, Ziextenzo What should I tell my care team before I take this medication? They need to know if you have any of these conditions: Kidney disease Latex allergy Ongoing radiation therapy Sickle cell disease Skin reactions to acrylic adhesives (On-Body Injector  only) An unusual or allergic reaction to pegfilgrastim, filgrastim, other medications, foods, dyes, or preservatives Pregnant or trying to get pregnant Breast-feeding How should I use this medication? This medication is for injection under the skin. If you get this medication at home, you will be taught how to prepare and give the pre-filled syringe or how to use the On-body Injector. Refer to the patient Instructions for Use for detailed instructions. Use exactly as directed. Tell your care team immediately if you suspect that the On-body Injector may not have performed as intended or if you suspect the use of the On-body Injector resulted in a missed or partial dose. It is important that you put your used needles and syringes in a special sharps container. Do not put them in a trash can. If you do not have a sharps container, call your pharmacist or care team to get one. Talk to your care team about the use of this medication in children. While this medication may be prescribed for selected conditions, precautions do apply. Overdosage: If you think you have taken too much of this medicine contact a poison control center or emergency room at once. NOTE: This medicine is only for you. Do not share this medicine with others. What if I miss a dose? It is important not to miss your dose. Call your care team if you miss your dose. If you miss a dose due to an On-body Injector failure or leakage, a new dose should be administered as soon as possible using a single prefilled syringe for manual use. What may interact with this medication? Interactions  have not been studied. This list may not describe all possible interactions. Give your health care provider a list of all the medicines, herbs, non-prescription drugs, or dietary supplements you use. Also tell them if you smoke, drink alcohol, or use illegal drugs. Some items may interact with your medicine. What should I watch for while using this  medication? Your condition will be monitored carefully while you are receiving this medication. You may need blood work done while you are taking this medication. Talk to your care team about your risk of cancer. You may be more at risk for certain types of cancer if you take this medication. If you are going to need a MRI, CT scan, or other procedure, tell your care team that you are using this medication (On-Body Injector only). What side effects may I notice from receiving this medication? Side effects that you should report to your care team as soon as possible: Allergic reactions--skin rash, itching, hives, swelling of the face, lips, tongue, or throat Capillary leak syndrome--stomach or muscle pain, unusual weakness or fatigue, feeling faint or lightheaded, decrease in the amount of urine, swelling of the ankles, hands, or feet, trouble breathing High white blood cell level--fever, fatigue, trouble breathing, night sweats, change in vision, weight loss Inflammation of the aorta--fever, fatigue, back, chest, or stomach pain, severe headache Kidney injury (glomerulonephritis)--decrease in the amount of urine, red or dark brown urine, foamy or bubbly urine, swelling of the ankles, hands, or feet Shortness of breath or trouble breathing Spleen injury--pain in upper left stomach or shoulder Unusual bruising or bleeding Side effects that usually do not require medical attention (report to your care team if they continue or are bothersome): Bone pain Pain in the hands or feet This list may not describe all possible side effects. Call your doctor for medical advice about side effects. You may report side effects to FDA at 1-800-FDA-1088. Where should I keep my medication? Keep out of the reach of children. If you are using this medication at home, you will be instructed on how to store it. Throw away any unused medication after the expiration date on the label. NOTE: This sheet is a summary. It  may not cover all possible information. If you have questions about this medicine, talk to your doctor, pharmacist, or health care provider.  2024 Elsevier/Gold Standard (2021-01-02 00:00:00)        To help prevent nausea and vomiting after your treatment, we encourage you to take your nausea medication as directed.  BELOW ARE SYMPTOMS THAT SHOULD BE REPORTED IMMEDIATELY: *FEVER GREATER THAN 100.4 F (38 C) OR HIGHER *CHILLS OR SWEATING *NAUSEA AND VOMITING THAT IS NOT CONTROLLED WITH YOUR NAUSEA MEDICATION *UNUSUAL SHORTNESS OF BREATH *UNUSUAL BRUISING OR BLEEDING *URINARY PROBLEMS (pain or burning when urinating, or frequent urination) *BOWEL PROBLEMS (unusual diarrhea, constipation, pain near the anus) TENDERNESS IN MOUTH AND THROAT WITH OR WITHOUT PRESENCE OF ULCERS (sore throat, sores in mouth, or a toothache) UNUSUAL RASH, SWELLING OR PAIN  UNUSUAL VAGINAL DISCHARGE OR ITCHING   Items with * indicate a potential emergency and should be followed up as soon as possible or go to the Emergency Department if any problems should occur.  Please show the CHEMOTHERAPY ALERT CARD or IMMUNOTHERAPY ALERT CARD at check-in to the Emergency Department and triage nurse.  Should you have questions after your visit or need to cancel or reschedule your appointment, please contact [facility]ENTER AT [facility] [phone]  and follow the prompts.  Office hours  are 8:00 a.m. to 4:30 p.m. Monday - Friday. Please note that voicemails left after 4:00 p.m. may not be returned until the following business day.  We are closed weekends and major holidays. You have access to a nurse at all times for urgent questions. Please call the main number to the clinic [phone] and follow the prompts.  For any non-urgent questions, you may also contact your provider using MyChart. We now offer e-Visits for anyone 51 and older to request care online for non-urgent symptoms. For details visit  [email or web address].   Also download the MyChart app! Go to the app store, search "MyChart", open the app, select [place], and log in with your MyChart username and password.

## 2022-12-03 NOTE — Progress Notes (Signed)
24 HR CHEMOTHERAPY CALL BACK:  Patient c/o swelling all over, but mostly face and legs this morning.  She also c/o increased shortness of breath.  Her BP is elevated.  She did take Mucinex this morning and felt that may be contributing to her hypertension.  She did take a fluid pill this morning and states that swelling and SOB have improved.  Dr. Anders Simmonds made aware.  MD at bedside to evaluate patient. No further actions at this time per Dr. Anders Simmonds, but patient advised to report to the ED if symptoms worsened.    Patient tolerated Fulphila injection with no complaints voiced.  Site clean and dry with no bruising or swelling noted.  No complaints of pain.  Discharged with vital signs stable and no signs or symptoms of distress noted.

## 2022-12-07 ENCOUNTER — Telehealth: Payer: Self-pay | Admitting: Radiation Oncology

## 2022-12-07 DIAGNOSIS — J209 Acute bronchitis, unspecified: Secondary | ICD-10-CM | POA: Diagnosis not present

## 2022-12-07 DIAGNOSIS — R059 Cough, unspecified: Secondary | ICD-10-CM | POA: Diagnosis not present

## 2022-12-07 DIAGNOSIS — J0101 Acute recurrent maxillary sinusitis: Secondary | ICD-10-CM | POA: Diagnosis not present

## 2022-12-07 DIAGNOSIS — R030 Elevated blood-pressure reading, without diagnosis of hypertension: Secondary | ICD-10-CM | POA: Diagnosis not present

## 2022-12-07 DIAGNOSIS — Z6820 Body mass index (BMI) 20.0-20.9, adult: Secondary | ICD-10-CM | POA: Diagnosis not present

## 2022-12-07 NOTE — Telephone Encounter (Signed)
Called pt to schedule CON with Dr. Roselind Messier to discuss XRT. Pt states she had consult with Dr. Domingo Pulse @ [facility] week of 10/14 and will be receiving XRT with [place] while receiving chemo under the care of Dr. Anders Simmonds. Referral closed, provider advised of this.

## 2022-12-09 DIAGNOSIS — Z510 Encounter for antineoplastic radiation therapy: Secondary | ICD-10-CM | POA: Diagnosis not present

## 2022-12-09 DIAGNOSIS — R928 Other abnormal and inconclusive findings on diagnostic imaging of breast: Secondary | ICD-10-CM | POA: Diagnosis not present

## 2022-12-09 DIAGNOSIS — I10 Essential (primary) hypertension: Secondary | ICD-10-CM | POA: Diagnosis not present

## 2022-12-09 DIAGNOSIS — C3480 Malignant neoplasm of overlapping sites of unspecified bronchus and lung: Secondary | ICD-10-CM | POA: Diagnosis not present

## 2022-12-09 DIAGNOSIS — F17210 Nicotine dependence, cigarettes, uncomplicated: Secondary | ICD-10-CM | POA: Diagnosis not present

## 2022-12-09 DIAGNOSIS — C771 Secondary and unspecified malignant neoplasm of intrathoracic lymph nodes: Secondary | ICD-10-CM | POA: Diagnosis not present

## 2022-12-09 DIAGNOSIS — Z780 Asymptomatic menopausal state: Secondary | ICD-10-CM | POA: Diagnosis not present

## 2022-12-09 DIAGNOSIS — J449 Chronic obstructive pulmonary disease, unspecified: Secondary | ICD-10-CM | POA: Diagnosis not present

## 2022-12-10 DIAGNOSIS — C3480 Malignant neoplasm of overlapping sites of unspecified bronchus and lung: Secondary | ICD-10-CM | POA: Diagnosis not present

## 2022-12-10 DIAGNOSIS — I10 Essential (primary) hypertension: Secondary | ICD-10-CM | POA: Diagnosis not present

## 2022-12-10 DIAGNOSIS — F17210 Nicotine dependence, cigarettes, uncomplicated: Secondary | ICD-10-CM | POA: Diagnosis not present

## 2022-12-10 DIAGNOSIS — C771 Secondary and unspecified malignant neoplasm of intrathoracic lymph nodes: Secondary | ICD-10-CM | POA: Diagnosis not present

## 2022-12-10 DIAGNOSIS — J449 Chronic obstructive pulmonary disease, unspecified: Secondary | ICD-10-CM | POA: Diagnosis not present

## 2022-12-10 DIAGNOSIS — R928 Other abnormal and inconclusive findings on diagnostic imaging of breast: Secondary | ICD-10-CM | POA: Diagnosis not present

## 2022-12-10 DIAGNOSIS — Z510 Encounter for antineoplastic radiation therapy: Secondary | ICD-10-CM | POA: Diagnosis not present

## 2022-12-10 DIAGNOSIS — Z780 Asymptomatic menopausal state: Secondary | ICD-10-CM | POA: Diagnosis not present

## 2022-12-14 DIAGNOSIS — F17210 Nicotine dependence, cigarettes, uncomplicated: Secondary | ICD-10-CM | POA: Diagnosis not present

## 2022-12-14 DIAGNOSIS — C771 Secondary and unspecified malignant neoplasm of intrathoracic lymph nodes: Secondary | ICD-10-CM | POA: Diagnosis not present

## 2022-12-14 DIAGNOSIS — Z780 Asymptomatic menopausal state: Secondary | ICD-10-CM | POA: Diagnosis not present

## 2022-12-14 DIAGNOSIS — R928 Other abnormal and inconclusive findings on diagnostic imaging of breast: Secondary | ICD-10-CM | POA: Diagnosis not present

## 2022-12-14 DIAGNOSIS — I10 Essential (primary) hypertension: Secondary | ICD-10-CM | POA: Diagnosis not present

## 2022-12-14 DIAGNOSIS — J449 Chronic obstructive pulmonary disease, unspecified: Secondary | ICD-10-CM | POA: Diagnosis not present

## 2022-12-14 DIAGNOSIS — Z510 Encounter for antineoplastic radiation therapy: Secondary | ICD-10-CM | POA: Diagnosis not present

## 2022-12-14 DIAGNOSIS — C3480 Malignant neoplasm of overlapping sites of unspecified bronchus and lung: Secondary | ICD-10-CM | POA: Diagnosis not present

## 2022-12-15 DIAGNOSIS — I10 Essential (primary) hypertension: Secondary | ICD-10-CM | POA: Diagnosis not present

## 2022-12-15 DIAGNOSIS — Z780 Asymptomatic menopausal state: Secondary | ICD-10-CM | POA: Diagnosis not present

## 2022-12-15 DIAGNOSIS — C771 Secondary and unspecified malignant neoplasm of intrathoracic lymph nodes: Secondary | ICD-10-CM | POA: Diagnosis not present

## 2022-12-15 DIAGNOSIS — R928 Other abnormal and inconclusive findings on diagnostic imaging of breast: Secondary | ICD-10-CM | POA: Diagnosis not present

## 2022-12-15 DIAGNOSIS — Z510 Encounter for antineoplastic radiation therapy: Secondary | ICD-10-CM | POA: Diagnosis not present

## 2022-12-15 DIAGNOSIS — F17210 Nicotine dependence, cigarettes, uncomplicated: Secondary | ICD-10-CM | POA: Diagnosis not present

## 2022-12-15 DIAGNOSIS — J449 Chronic obstructive pulmonary disease, unspecified: Secondary | ICD-10-CM | POA: Diagnosis not present

## 2022-12-15 DIAGNOSIS — C3480 Malignant neoplasm of overlapping sites of unspecified bronchus and lung: Secondary | ICD-10-CM | POA: Diagnosis not present

## 2022-12-16 DIAGNOSIS — J449 Chronic obstructive pulmonary disease, unspecified: Secondary | ICD-10-CM | POA: Diagnosis not present

## 2022-12-16 DIAGNOSIS — Z510 Encounter for antineoplastic radiation therapy: Secondary | ICD-10-CM | POA: Diagnosis not present

## 2022-12-16 DIAGNOSIS — I10 Essential (primary) hypertension: Secondary | ICD-10-CM | POA: Diagnosis not present

## 2022-12-16 DIAGNOSIS — Z780 Asymptomatic menopausal state: Secondary | ICD-10-CM | POA: Diagnosis not present

## 2022-12-16 DIAGNOSIS — C3480 Malignant neoplasm of overlapping sites of unspecified bronchus and lung: Secondary | ICD-10-CM | POA: Diagnosis not present

## 2022-12-16 DIAGNOSIS — R928 Other abnormal and inconclusive findings on diagnostic imaging of breast: Secondary | ICD-10-CM | POA: Diagnosis not present

## 2022-12-16 DIAGNOSIS — C771 Secondary and unspecified malignant neoplasm of intrathoracic lymph nodes: Secondary | ICD-10-CM | POA: Diagnosis not present

## 2022-12-16 DIAGNOSIS — F17210 Nicotine dependence, cigarettes, uncomplicated: Secondary | ICD-10-CM | POA: Diagnosis not present

## 2022-12-17 DIAGNOSIS — C3480 Malignant neoplasm of overlapping sites of unspecified bronchus and lung: Secondary | ICD-10-CM | POA: Diagnosis not present

## 2022-12-17 DIAGNOSIS — I10 Essential (primary) hypertension: Secondary | ICD-10-CM | POA: Diagnosis not present

## 2022-12-17 DIAGNOSIS — R928 Other abnormal and inconclusive findings on diagnostic imaging of breast: Secondary | ICD-10-CM | POA: Diagnosis not present

## 2022-12-17 DIAGNOSIS — F17210 Nicotine dependence, cigarettes, uncomplicated: Secondary | ICD-10-CM | POA: Diagnosis not present

## 2022-12-17 DIAGNOSIS — C771 Secondary and unspecified malignant neoplasm of intrathoracic lymph nodes: Secondary | ICD-10-CM | POA: Diagnosis not present

## 2022-12-17 DIAGNOSIS — Z780 Asymptomatic menopausal state: Secondary | ICD-10-CM | POA: Diagnosis not present

## 2022-12-17 DIAGNOSIS — Z510 Encounter for antineoplastic radiation therapy: Secondary | ICD-10-CM | POA: Diagnosis not present

## 2022-12-17 DIAGNOSIS — J449 Chronic obstructive pulmonary disease, unspecified: Secondary | ICD-10-CM | POA: Diagnosis not present

## 2022-12-20 ENCOUNTER — Inpatient Hospital Stay: Payer: Medicare HMO | Admitting: Oncology

## 2022-12-20 ENCOUNTER — Inpatient Hospital Stay: Payer: Medicare HMO | Attending: Oncology

## 2022-12-20 ENCOUNTER — Encounter: Payer: Self-pay | Admitting: Oncology

## 2022-12-20 ENCOUNTER — Inpatient Hospital Stay: Payer: Medicare HMO | Admitting: Dietician

## 2022-12-20 ENCOUNTER — Inpatient Hospital Stay: Payer: Medicare HMO

## 2022-12-20 VITALS — BP 126/70 | HR 108 | Temp 99.2°F | Resp 18

## 2022-12-20 DIAGNOSIS — D3501 Benign neoplasm of right adrenal gland: Secondary | ICD-10-CM | POA: Insufficient documentation

## 2022-12-20 DIAGNOSIS — F17210 Nicotine dependence, cigarettes, uncomplicated: Secondary | ICD-10-CM | POA: Diagnosis not present

## 2022-12-20 DIAGNOSIS — C3480 Malignant neoplasm of overlapping sites of unspecified bronchus and lung: Secondary | ICD-10-CM | POA: Diagnosis not present

## 2022-12-20 DIAGNOSIS — C7801 Secondary malignant neoplasm of right lung: Secondary | ICD-10-CM | POA: Diagnosis not present

## 2022-12-20 DIAGNOSIS — C7802 Secondary malignant neoplasm of left lung: Secondary | ICD-10-CM | POA: Diagnosis not present

## 2022-12-20 DIAGNOSIS — Z5111 Encounter for antineoplastic chemotherapy: Secondary | ICD-10-CM | POA: Insufficient documentation

## 2022-12-20 DIAGNOSIS — C3491 Malignant neoplasm of unspecified part of right bronchus or lung: Secondary | ICD-10-CM | POA: Diagnosis not present

## 2022-12-20 DIAGNOSIS — C771 Secondary and unspecified malignant neoplasm of intrathoracic lymph nodes: Secondary | ICD-10-CM | POA: Diagnosis not present

## 2022-12-20 DIAGNOSIS — Z5189 Encounter for other specified aftercare: Secondary | ICD-10-CM | POA: Diagnosis not present

## 2022-12-20 DIAGNOSIS — R928 Other abnormal and inconclusive findings on diagnostic imaging of breast: Secondary | ICD-10-CM | POA: Diagnosis not present

## 2022-12-20 DIAGNOSIS — J449 Chronic obstructive pulmonary disease, unspecified: Secondary | ICD-10-CM | POA: Diagnosis not present

## 2022-12-20 DIAGNOSIS — I10 Essential (primary) hypertension: Secondary | ICD-10-CM | POA: Diagnosis not present

## 2022-12-20 DIAGNOSIS — Z510 Encounter for antineoplastic radiation therapy: Secondary | ICD-10-CM | POA: Diagnosis not present

## 2022-12-20 DIAGNOSIS — D3502 Benign neoplasm of left adrenal gland: Secondary | ICD-10-CM | POA: Diagnosis not present

## 2022-12-20 DIAGNOSIS — Z780 Asymptomatic menopausal state: Secondary | ICD-10-CM | POA: Diagnosis not present

## 2022-12-20 LAB — CBC WITH DIFFERENTIAL/PLATELET
Abs Immature Granulocytes: 0.15 10*3/uL — ABNORMAL HIGH (ref 0.00–0.07)
Basophils Absolute: 0.1 10*3/uL (ref 0.0–0.1)
Basophils Relative: 1 %
Eosinophils Absolute: 0 10*3/uL (ref 0.0–0.5)
Eosinophils Relative: 0 %
HCT: 32.5 % — ABNORMAL LOW (ref 36.0–46.0)
Hemoglobin: 10.9 g/dL — ABNORMAL LOW (ref 12.0–15.0)
Immature Granulocytes: 2 %
Lymphocytes Relative: 12 %
Lymphs Abs: 1.2 10*3/uL (ref 0.7–4.0)
MCH: 32.2 pg (ref 26.0–34.0)
MCHC: 33.5 g/dL (ref 30.0–36.0)
MCV: 95.9 fL (ref 80.0–100.0)
Monocytes Absolute: 1 10*3/uL (ref 0.1–1.0)
Monocytes Relative: 10 %
Neutro Abs: 7.9 10*3/uL — ABNORMAL HIGH (ref 1.7–7.7)
Neutrophils Relative %: 75 %
Platelets: 602 10*3/uL — ABNORMAL HIGH (ref 150–400)
RBC: 3.39 MIL/uL — ABNORMAL LOW (ref 3.87–5.11)
RDW: 14.3 % (ref 11.5–15.5)
WBC: 10.3 10*3/uL (ref 4.0–10.5)
nRBC: 0 % (ref 0.0–0.2)

## 2022-12-20 LAB — COMPREHENSIVE METABOLIC PANEL
ALT: 43 U/L (ref 0–44)
AST: 19 U/L (ref 15–41)
Albumin: 3.5 g/dL (ref 3.5–5.0)
Alkaline Phosphatase: 50 U/L (ref 38–126)
Anion gap: 6 (ref 5–15)
BUN: 26 mg/dL — ABNORMAL HIGH (ref 8–23)
CO2: 25 mmol/L (ref 22–32)
Calcium: 8.6 mg/dL — ABNORMAL LOW (ref 8.9–10.3)
Chloride: 100 mmol/L (ref 98–111)
Creatinine, Ser: 0.82 mg/dL (ref 0.44–1.00)
GFR, Estimated: >60 mL/min (ref >60)
Glucose, Bld: 110 mg/dL — ABNORMAL HIGH (ref 70–99)
Potassium: 4 mmol/L (ref 3.5–5.1)
Sodium: 131 mmol/L — ABNORMAL LOW (ref 135–145)
Total Bilirubin: 0.2 mg/dL (ref <1.2)
Total Protein: 6.5 g/dL (ref 6.5–8.1)

## 2022-12-20 LAB — MAGNESIUM: Magnesium: 2.1 mg/dL (ref 1.7–2.4)

## 2022-12-20 MED ORDER — SODIUM CHLORIDE 0.9 % IV SOLN: Freq: Once | INTRAVENOUS | Status: AC

## 2022-12-20 MED ORDER — DEXAMETHASONE SODIUM PHOSPHATE 10 MG/ML IJ SOLN
10.0000 mg | Freq: Once | INTRAMUSCULAR | Status: AC
Start: 2022-12-20 — End: 2022-12-20
  Administered 2022-12-20: 10 mg via INTRAVENOUS
  Filled 2022-12-20: qty 1

## 2022-12-20 MED ORDER — SODIUM CHLORIDE 0.9% FLUSH
10.0000 mL | INTRAVENOUS | Status: DC | PRN
Start: 2022-12-20 — End: 2022-12-20
  Administered 2022-12-20: 10 mL

## 2022-12-20 MED ORDER — POTASSIUM CHLORIDE IN NACL 20-0.9 MEQ/L-% IV SOLN
Freq: Once | INTRAVENOUS | Status: AC
  Filled 2022-12-20: qty 1000

## 2022-12-20 MED ORDER — SODIUM CHLORIDE 0.9 % IV SOLN: INTRAVENOUS | Status: DC

## 2022-12-20 MED ORDER — SODIUM CHLORIDE 0.9 % IV SOLN
60.0000 mg/m2 | Freq: Once | INTRAVENOUS | Status: AC
  Administered 2022-12-20: 100 mg via INTRAVENOUS
  Filled 2022-12-20: qty 100

## 2022-12-20 MED ORDER — POTASSIUM CHLORIDE IN NACL 20-0.9 MEQ/L-% IV SOLN
Freq: Once | INTRAVENOUS | Status: DC
Start: 2022-12-20 — End: 2022-12-20
  Filled 2022-12-20: qty 1000

## 2022-12-20 MED ORDER — DEXAMETHASONE SODIUM PHOSPHATE 100 MG/10ML IJ SOLN: 10.0000 mg | Freq: Once | INTRAMUSCULAR | Status: DC

## 2022-12-20 MED ORDER — HEPARIN SOD (PORK) LOCK FLUSH 100 UNIT/ML IV SOLN
500.0000 [IU] | Freq: Once | INTRAVENOUS | Status: AC | PRN
Start: 2022-12-20 — End: 2022-12-20
  Administered 2022-12-20: 500 [IU]

## 2022-12-20 MED ORDER — SODIUM CHLORIDE 0.9 % IV SOLN
150.0000 mg | Freq: Once | INTRAVENOUS | Status: AC
  Administered 2022-12-20: 150 mg via INTRAVENOUS
  Filled 2022-12-20: qty 5

## 2022-12-20 MED ORDER — MAGNESIUM SULFATE 2 GM/50ML IV SOLN
2.0000 g | Freq: Once | INTRAVENOUS | Status: AC
Start: 2022-12-20 — End: 2022-12-20
  Administered 2022-12-20: 2 g via INTRAVENOUS
  Filled 2022-12-20: qty 50

## 2022-12-20 MED ORDER — SODIUM CHLORIDE 0.9% FLUSH
10.0000 mL | INTRAVENOUS | Status: DC | PRN
  Administered 2022-12-20: 10 mL via INTRAVENOUS

## 2022-12-20 MED ORDER — SODIUM CHLORIDE 0.9 % IV SOLN
100.0000 mg/m2 | Freq: Once | INTRAVENOUS | Status: AC
  Administered 2022-12-20: 152 mg via INTRAVENOUS
  Filled 2022-12-20: qty 7.6

## 2022-12-20 MED ORDER — PALONOSETRON HCL INJECTION 0.25 MG/5ML
0.2500 mg | Freq: Once | INTRAVENOUS | Status: AC
  Administered 2022-12-20: 0.25 mg via INTRAVENOUS
  Filled 2022-12-20: qty 5

## 2022-12-20 NOTE — Assessment & Plan Note (Signed)
-  Patient is an avid smoker with history of smoking 2 packs/day until recently -Patient continues to smoke -Discussed the risks of smoking including faster progression of cancer, inflammation causing difficulties with chemotherapy and radiation -Offered help to quit smoking, patient is reluctant at this time

## 2022-12-20 NOTE — Patient Instructions (Signed)
[facility] - A DEPT OF [facility]  Discharge Instructions: Thank you for choosing [facility] to provide your oncology and hematology care.  If you have a lab appointment with the Cancer Center - please note that after April 8th, 2024, all labs will be drawn in the cancer center.  You do not have to check in or register with the main entrance as you have in the past but will complete your check-in in the cancer center.  Wear comfortable clothing and clothing appropriate for easy access to any Portacath or PICC line.   We strive to give you quality time with your provider. You may need to reschedule your appointment if you arrive late (15 or more minutes).  Arriving late affects you and other patients whose appointments are after yours.  Also, if you miss three or more appointments without notifying the office, you may be dismissed from the clinic at the provider's discretion.      For prescription refill requests, have your pharmacy contact our office and allow 72 hours for refills to be completed.    Today you received the following chemotherapy and/or immunotherapy agents Cisplatin and Etoposide   To help prevent nausea and vomiting after your treatment, we encourage you to take your nausea medication as directed.  Cisplatin Injection What is this medication? CISPLATIN (SIS pla tin) treats some types of cancer. It works by slowing down the growth of cancer cells. This medicine may be used for other purposes; ask your health care provider or pharmacist if you have questions. COMMON BRAND NAME(S): Platinol, Platinol -AQ What should I tell my care team before I take this medication? They need to know if you have any of these conditions: Eye disease, vision problems Hearing problems Kidney disease Low blood counts, such as low white cells, platelets, or red blood cells Tingling of the fingers or toes, or other nerve disorder An unusual or  allergic reaction to cisplatin, carboplatin, oxaliplatin, other medications, foods, dyes, or preservatives If you or your partner are pregnant or trying to get pregnant Breast-feeding How should I use this medication? This medication is injected into a vein. It is given by your care team in a hospital or clinic setting. Talk to your care team about the use of this medication in children. Special care may be needed. Overdosage: If you think you have taken too much of this medicine contact a poison control center or emergency room at once. NOTE: This medicine is only for you. Do not share this medicine with others. What if I miss a dose? Keep appointments for follow-up doses. It is important not to miss your dose. Call your care team if you are unable to keep an appointment. What may interact with this medication? Do not take this medication with any of the following: Live virus vaccines This medication may also interact with the following: Certain antibiotics, such as amikacin, gentamicin, neomycin, polymyxin B, streptomycin, tobramycin, vancomycin Foscarnet This list may not describe all possible interactions. Give your health care provider a list of all the medicines, herbs, non-prescription drugs, or dietary supplements you use. Also tell them if you smoke, drink alcohol, or use illegal drugs. Some items may interact with your medicine. What should I watch for while using this medication? Your condition will be monitored carefully while you are receiving this medication. You may need blood work done while taking this medication. This medication may make you feel generally unwell. This is  not uncommon, as chemotherapy can affect healthy cells as well as cancer cells. Report any side effects. Continue your course of treatment even though you feel ill unless your care team tells you to stop. This medication may increase your risk of getting an infection. Call your care team for advice if you get a  fever, chills, sore throat, or other symptoms of a cold or flu. Do not treat yourself. Try to avoid being around people who are sick. Avoid taking medications that contain aspirin, acetaminophen, ibuprofen, naproxen, or ketoprofen unless instructed by your care team. These medications may hide a fever. This medication may increase your risk to bruise or bleed. Call your care team if you notice any unusual bleeding. Be careful brushing or flossing your teeth or using a toothpick because you may get an infection or bleed more easily. If you have any dental work done, tell your dentist you are receiving this medication. Drink fluids as directed while you are taking this medication. This will help protect your kidneys. Call your care team if you get diarrhea. Do not treat yourself. Talk to your care team if you or your partner wish to become pregnant or think you might be pregnant. This medication can cause serious birth defects if taken during pregnancy and for 14 months after the last dose. A negative pregnancy test is required before starting this medication. A reliable form of contraception is recommended while taking this medication and for 14 months after the last dose. Talk to your care team about effective forms of contraception. Do not father a child while taking this medication and for 11 months after the last dose. Use a condom during sex during this time period. Do not breast-feed while taking this medication. This medication may cause infertility. Talk to your care team if you are concerned about your fertility. What side effects may I notice from receiving this medication? Side effects that you should report to your care team as soon as possible: Allergic reactions--skin rash, itching, hives, swelling of the face, lips, tongue, or throat Eye pain, change in vision, vision loss Hearing loss, ringing in ears Infection--fever, chills, cough, sore throat, wounds that don't heal, pain or trouble  when passing urine, general feeling of discomfort or being unwell Kidney injury--decrease in the amount of urine, swelling of the ankles, hands, or feet Low red blood cell level--unusual weakness or fatigue, dizziness, headache, trouble breathing Painful swelling, warmth, or redness of the skin, blisters or sores at the infusion site Pain, tingling, or numbness in the hands or feet Unusual bruising or bleeding Side effects that usually do not require medical attention (report to your care team if they continue or are bothersome): Hair loss Nausea Vomiting This list may not describe all possible side effects. Call your doctor for medical advice about side effects. You may report side effects to FDA at 1-800-FDA-1088. Where should I keep my medication? This medication is given in a hospital or clinic. It will not be stored at home. NOTE: This sheet is a summary. It may not cover all possible information. If you have questions about this medicine, talk to your doctor, pharmacist, or health care provider.  2024 Elsevier/Gold Standard (2021-06-05 00:00:00)  Etoposide Injection What is this medication? ETOPOSIDE (e toe POE side) treats some types of cancer. It works by slowing down the growth of cancer cells. This medicine may be used for other purposes; ask your health care provider or pharmacist if you have questions. COMMON BRAND NAME(S): Etopophos,  Toposar, VePesid What should I tell my care team before I take this medication? They need to know if you have any of these conditions: Infection Kidney disease Liver disease Low blood counts, such as low white cell, platelet, red cell counts An unusual or allergic reaction to etoposide, other medications, foods, dyes, or preservatives If you or your partner are pregnant or trying to get pregnant Breastfeeding How should I use this medication? This medication is injected into a vein. It is given by your care team in a hospital or clinic  setting. Talk to your care team about the use of this medication in children. Special care may be needed. Overdosage: If you think you have taken too much of this medicine contact a poison control center or emergency room at once. NOTE: This medicine is only for you. Do not share this medicine with others. What if I miss a dose? Keep appointments for follow-up doses. It is important not to miss your dose. Call your care team if you are unable to keep an appointment. What may interact with this medication? Warfarin This list may not describe all possible interactions. Give your health care provider a list of all the medicines, herbs, non-prescription drugs, or dietary supplements you use. Also tell them if you smoke, drink alcohol, or use illegal drugs. Some items may interact with your medicine. What should I watch for while using this medication? Your condition will be monitored carefully while you are receiving this medication. This medication may make you feel generally unwell. This is not uncommon as chemotherapy can affect healthy cells as well as cancer cells. Report any side effects. Continue your course of treatment even though you feel ill unless your care team tells you to stop. This medication can cause serious side effects. To reduce the risk, your care team may give you other medications to take before receiving this one. Be sure to follow the directions from your care team. This medication may increase your risk of getting an infection. Call your care team for advice if you get a fever, chills, sore throat, or other symptoms of a cold or flu. Do not treat yourself. Try to avoid being around people who are sick. This medication may increase your risk to bruise or bleed. Call your care team if you notice any unusual bleeding. Talk to your care team about your risk of cancer. You may be more at risk for certain types of cancers if you take this medication. Talk to your care team if you may  be pregnant. Serious birth defects can occur if you take this medication during pregnancy and for 6 months after the last dose. You will need a negative pregnancy test before starting this medication. Contraception is recommended while taking this medication and for 6 months after the last dose. Your care team can help you find the option that works for you. If your partner can get pregnant, use a condom during sex while taking this medication and for 4 months after the last dose. Do not breastfeed while taking this medication. This medication may cause infertility. Talk to your care team if you are concerned about your fertility. What side effects may I notice from receiving this medication? Side effects that you should report to your care team as soon as possible: Allergic reactions--skin rash, itching, hives, swelling of the face, lips, tongue, or throat Infection--fever, chills, cough, sore throat, wounds that don't heal, pain or trouble when passing urine, general feeling of discomfort or being  unwell Low red blood cell level--unusual weakness or fatigue, dizziness, headache, trouble breathing Unusual bruising or bleeding Side effects that usually do not require medical attention (report to your care team if they continue or are bothersome): Diarrhea Fatigue Hair loss Loss of appetite Nausea Vomiting This list may not describe all possible side effects. Call your doctor for medical advice about side effects. You may report side effects to FDA at 1-800-FDA-1088. Where should I keep my medication? This medication is given in a hospital or clinic. It will not be stored at home. NOTE: This sheet is a summary. It may not cover all possible information. If you have questions about this medicine, talk to your doctor, pharmacist, or health care provider.  2024 Elsevier/Gold Standard (2021-06-25 00:00:00)    BELOW ARE SYMPTOMS THAT SHOULD BE REPORTED IMMEDIATELY: *FEVER GREATER THAN 100.4 F  (38 C) OR HIGHER *CHILLS OR SWEATING *NAUSEA AND VOMITING THAT IS NOT CONTROLLED WITH YOUR NAUSEA MEDICATION *UNUSUAL SHORTNESS OF BREATH *UNUSUAL BRUISING OR BLEEDING *URINARY PROBLEMS (pain or burning when urinating, or frequent urination) *BOWEL PROBLEMS (unusual diarrhea, constipation, pain near the anus) TENDERNESS IN MOUTH AND THROAT WITH OR WITHOUT PRESENCE OF ULCERS (sore throat, sores in mouth, or a toothache) UNUSUAL RASH, SWELLING OR PAIN  UNUSUAL VAGINAL DISCHARGE OR ITCHING   Items with * indicate a potential emergency and should be followed up as soon as possible or go to the Emergency Department if any problems should occur.  Please show the CHEMOTHERAPY ALERT CARD or IMMUNOTHERAPY ALERT CARD at check-in to the Emergency Department and triage nurse.  Should you have questions after your visit or need to cancel or reschedule your appointment, please contact [facility] - A DEPT OF [facility] [phone]  and follow the prompts.  Office hours are 8:00 a.m. to 4:30 p.m. Monday - Friday. Please note that voicemails left after 4:00 p.m. may not be returned until the following business day.  We are closed weekends and major holidays. You have access to a nurse at all times for urgent questions. Please call the main number to the clinic [phone] and follow the prompts.  For any non-urgent questions, you may also contact your provider using MyChart. We now offer e-Visits for anyone 43 and older to request care online for non-urgent symptoms. For details visit [email or web address].   Also download the MyChart app! Go to the app store, search "MyChart", open the app, select [facility], and log in with your MyChart username and password.

## 2022-12-20 NOTE — Progress Notes (Signed)
Nutrition Follow-up:  Pt with stage IV small cell lung cancer. Patient received first cisplatin/etoposide 10/15 concurrent with radiation.  Met with patient in infusion. Patient tolerating concurrent therapy well overall. She endorses mild headache in the mornings. This resolves with tylenol and cup of coffee. She reports stable appetite, eating 3 meals plus drinking 1-2 Ensure. Patient had breakfast bar, pack of nabs for breakfast, [place], salami, cheese sandwich with watermelon for lunch. Recalls steak and cheese bread for dinner. Patient denies nutrition impact symptoms at this time.    Medications: reviewed   Labs: Na 131, glucose 110, BUN 26  Anthropometrics: Wt 114 lb 4.8 oz today decreased  10/17 - 116 lb 3.2 oz    NUTRITION DIAGNOSIS: Food and nutrition related knowledge deficit improving   INTERVENTION:  Continue drinking Ensure for added calories and protein, recommend 2/day in between meals    MONITORING, EVALUATION, GOAL: wt trends, intake   NEXT VISIT: Monday November 25 during infusion

## 2022-12-20 NOTE — Progress Notes (Signed)
Patient presents today for Cisplatin and Etoposide infusion. Patient is in satisfactory condition with no new complaints voiced.  Vital signs are stable.  Labs reviewed by Dr. Ellin Saba during the office visit and all labs are within treatment parameters.  We will proceed with treatment per MD orders.   Patient urinated 200 mL prior to Cisplatin administration.  Treatment given today per MD orders. Tolerated infusion without adverse affects. Vital signs stable. No complaints at this time. Discharged from clinic ambulatory in stable condition. Alert and oriented x 3. F/U with [facility] as scheduled.

## 2022-12-20 NOTE — Assessment & Plan Note (Addendum)
-  Oncology history as above -Discussed the patient's case at lung tumor board, consensus is to treat it like a limited stage small cell lung cancer with chemoRT -Started RT last week. -Labs reviewed today. Patient is good to go for chemotherapy Discussed potential use of darvalumab (immunotherapy) after completion of current treatment cycle, pending response to chemo RT  Return to clinic in 3 weeks for next treatment

## 2022-12-20 NOTE — Progress Notes (Addendum)
Patient Care Team: Lianne Moris, PA-C as PCP - General (Family Medicine) Wendall Stade, MD as PCP - Cardiology (Cardiology) Cindie Crumbly, MD as Medical Oncologist (Medical Oncology) Therese Sarah, RN as Oncology Nurse Navigator (Medical Oncology)  Clinic Day:  12/20/2022  Referring physician: Lianne Moris, PA-C   CHIEF COMPLAINT:  Stage IVA Small cell lug carcinoma     ASSESSMENT & PLAN:   Assessment & Plan: Virginia Powell  is a 72 y.o. 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 -Labs reviewed today. Patient is good to go for chemotherapy Discussed potential use of darvalumab (immunotherapy) after completion of current treatment cycle, pending response to chemo RT  Return to clinic in 3 weeks for next treatment  Smokes cigarettes -Patient is an avid smoker with history of smoking 2 packs/day until recently -Patient continues to smoke -Discussed the risks of smoking including faster progression of cancer, inflammation causing difficulties with chemotherapy and radiation -Offered help to quit smoking, patient is reluctant at this time    The patient understands the plans discussed today and is in agreement with them.  She knows to contact our office if she develops concerns prior to her next appointment.  I provided 20 minutes of face-to-face time during this encounter and > 50% was spent counseling as documented under my assessment and plan.    Cindie Crumbly, MD  [facility] 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 Growing solid nodule of the right lower lobe measuring 8 mm and new irregular solid nodule of the left lower lobe measuring 9.3 mm. Lung-RADS 4B, suspicious. Additional imaging evaluation or consultation with Pulmonology or Thoracic Surgery recommended.   09/09/2022 PET scan   IMPRESSION: 1. Hypermetabolic right hilar adenopathy and bilateral pulmonary nodules, findings indicative of synchronous primary bronchogenic carcinomas. No evidence of distant metastatic disease. 2. Vague slight thickening of lateral breast soft tissue with metabolism just below blood pool. Consider mammographic correlation, as clinically indicated. 3. Slight marginal irregularity of the liver raises suspicion for cirrhosis. 4. Bilateral adrenal adenomas.   10/02/2022 Imaging   CT chest without contrast: IMPRESSION: 1. No significant change in the appearance of tracer avid nodules within the medial right lower lobe and superior segment of left lower lobe. 2. Stable appearance of tracer avid nodal conglomeration noted within the right hilum. Which is concerning for nodal metastasis. 3. Stable appearance of bilateral adrenal adenomas. No follow-up imaging recommended.   10/04/2022 Pathology Results   A. LUNG, LLL, FINE NEEDLE ASPIRATION:  - No malignant cells identified  - Granulomatous inflammation   B. LUNG, LLL, BRUSHING:  - No malignant cells identified  - Granulomatous inflammation D. LUNG, RLL, FINE NEEDLE ASPIRATION:  - No malignant cells identified   E. 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 Interval enlargement of the superior segment left  lower lobe lesion with stable hypermetabolism. 2. Stable 9 mm medial right lower lobe pulmonary nodule with slightly increased  SUV max. 3. Persistent hypermetabolic right hilar adenopathy. 4. New 10 mm sub solid lesion in the left lower lobe with SUV max of 2.14. This is likely inflammatory. Attention on future studies is suggested. 5. Stable 14 mm linear density in the right lateral breast with low level FDG uptake. An indolent breast cancer is possible. 6. No findings for abdominal/pelvic metastatic disease or osseous metastatic disease. 7. Stable benign bilateral adrenal gland adenomas.   11/22/2022 Imaging   MRI Brain: No evidence of intracranial metastatic disease.     11/30/2022 -  Chemotherapy   Patient is on Treatment Plan : LUNG SMALL CELL Cisplatin (80) D1 + Etoposide (100) D1-3 q21d     12/13/2022 -  Radiation Therapy   Started RT twice dialy       Current Treatment:  Chemo RT with Cisplatin + Etoposide  INTERVAL HISTORY:  Virginia Powell is here today for follow up. She reports increased energy levels and has been more active at home. She has been attending radiation sessions twice a day and has tolerated the treatment well.  The patient experienced bronchitis after her last visit, which was managed with a steroid shot, antibiotic shot, and a Z-Pak. She recovered well from the bronchitis. She also reports hair loss, a common side effect of chemotherapy, which has led to an increase in migraines. However, the migraines have been manageable with Tylenol and coffee.  The patient also reports some fluid retention, which was managed with diuretics. She has a history of smoking and has been trying to quit, with some success. She has not completely quit smoking but has reduced her smoking frequency.   I have reviewed the past medical history, past surgical history, social history and family history with the patient and they are unchanged from previous note.  ALLERGIES:  is allergic to morphine and codeine.  MEDICATIONS:  Current Outpatient Medications  Medication Sig Dispense Refill    albuterol (PROVENTIL) (2.5 MG/3ML) 0.083% nebulizer solution SMARTSIG:1 Vial(s) Via Nebulizer Every 4-6 Hours PRN     albuterol (VENTOLIN HFA) 108 (90 Base) MCG/ACT inhaler SMARTSIG:2 Puff(s) By Mouth Every 4 Hours     atorvastatin (LIPITOR) 40 MG tablet Take 40 mg by mouth daily.     CISPLATIN IV Inject into the vein every 21 ( twenty-one) days.     ETOPOSIDE IV Inject into the vein every 21 ( twenty-one) days.     fexofenadine (ALLEGRA) 180 MG tablet Take 180 mg by mouth daily as needed for allergies or rhinitis.     fluticasone (FLONASE) 50 MCG/ACT nasal spray Place into both nostrils daily as needed.     furosemide (LASIX) 20 MG tablet Take 20 mg by mouth daily.     gabapentin (NEURONTIN) 300 MG capsule Take 600 mg by mouth at bedtime.     hydrochlorothiazide (HYDRODIURIL) 12.5 MG tablet Take 12.5 mg by mouth daily.     hydrOXYzine (ATARAX) 25 MG tablet Take 1 tablet by mouth at bedtime.     lidocaine-prilocaine (EMLA) cream Apply a quarter-sized amount to port a cath site and cover with plastic wrap 1 hour prior to infusion appointments 30 g 3   loratadine (CLARITIN) 10 MG tablet Take 10 mg by mouth daily.     olmesartan (BENICAR) 40 MG tablet Take 1 tablet (40 mg total) by mouth daily. 30 tablet 0   prochlorperazine (COMPAZINE) 10  MG tablet Take 1 tablet (10 mg total) by mouth every 6 (six) hours as needed. 60 tablet 2   rizatriptan (MAXALT-MLT) 10 MG disintegrating tablet Take 10 mg by mouth as needed for migraine. May repeat in 2 hours if needed     TRELEGY ELLIPTA 100-62.5-25 MCG/ACT AEPB Inhale 1 puff into the lungs daily.     No current facility-administered medications for this visit.   Facility-Administered Medications Ordered in Other Visits  Medication Dose Route Frequency Provider Last Rate Last Admin   0.9 %  sodium chloride infusion   Intravenous Continuous Cindie Crumbly, MD 10 mL/hr at 12/20/22 0935 New Bag at 12/20/22 0935   0.9 %  sodium chloride infusion   Intravenous  Once Cindie Crumbly, MD       CISplatin (PLATINOL) 100 mg in sodium chloride 0.9 % 500 mL chemo infusion  60 mg/m2 (Treatment Plan Recorded) Intravenous Once Cindie Crumbly, MD 600 mL/hr at 12/20/22 1207 100 mg at 12/20/22 1207   etoposide (VEPESID) 152 mg in sodium chloride 0.9 % 500 mL chemo infusion  100 mg/m2 (Treatment Plan Recorded) Intravenous Once Cindie Crumbly, MD       heparin lock flush 100 unit/mL  500 Units Intracatheter Once PRN Cindie Crumbly, MD       sodium chloride flush (NS) 0.9 % injection 10 mL  10 mL Intracatheter PRN Cindie Crumbly, MD         REVIEW OF SYSTEMS:   Constitutional: Denies fevers, chills or abnormal weight loss Eyes: Denies blurriness of vision Ears, nose, mouth, throat, and face: Denies mucositis or sore throat Respiratory: Denies cough, dyspnea or wheezes Cardiovascular: Denies palpitation, chest discomfort or lower extremity swelling Gastrointestinal:  Denies nausea, heartburn or change in bowel habits Skin: Denies abnormal skin rashes Lymphatics: Denies new lymphadenopathy or easy bruising Neurological:Denies numbness, tingling or new weaknesses Behavioral/Psych: Mood is stable, no new changes  All other systems were reviewed with the patient and are negative.   VITALS:  There were no vitals taken for this visit.  Wt Readings from Last 3 Encounters:  12/20/22 114 lb 4.8 oz (51.8 kg)  11/30/22 116 lb 3.2 oz (52.7 kg)  11/17/22 113 lb (51.3 kg)    Performance status (ECOG): 0 - Asymptomatic  PHYSICAL EXAM:   GENERAL:alert, no distress and comfortable SKIN: skin color, texture, turgor are normal, no rashes or significant lesions LUNGS: clear to auscultation and percussion with normal breathing effort HEART: regular rate & rhythm and no murmurs and no lower extremity edema ABDOMEN:abdomen soft, non-tender and normal bowel sounds Musculoskeletal:no cyanosis of digits and no clubbing  NEURO: alert & oriented x 3 with fluent  speech  LABORATORY DATA:  I have reviewed the data as listed    Component Value Date/Time   NA 131 (L) 12/20/2022 0824   K 4.0 12/20/2022 0824   CL 100 12/20/2022 0824   CO2 25 12/20/2022 0824   GLUCOSE 110 (H) 12/20/2022 0824   BUN 26 (H) 12/20/2022 0824   CREATININE 0.82 12/20/2022 0824   CALCIUM 8.6 (L) 12/20/2022 0824   PROT 6.5 12/20/2022 0824   ALBUMIN 3.5 12/20/2022 0824   AST 19 12/20/2022 0824   ALT 43 12/20/2022 0824   ALKPHOS 50 12/20/2022 0824   BILITOT 0.2 12/20/2022 0824   GFRNONAA >60 12/20/2022 0824     Lab Results  Component Value Date   WBC 10.3 12/20/2022   NEUTROABS 7.9 (H) 12/20/2022   HGB 10.9 (L) 12/20/2022   HCT 32.5 (L)  12/20/2022   MCV 95.9 12/20/2022   PLT 602 (H) 12/20/2022      Chemistry      Component Value Date/Time   NA 131 (L) 12/20/2022 0824   K 4.0 12/20/2022 0824   CL 100 12/20/2022 0824   CO2 25 12/20/2022 0824   BUN 26 (H) 12/20/2022 0824   CREATININE 0.82 12/20/2022 0824      Component Value Date/Time   CALCIUM 8.6 (L) 12/20/2022 0824   ALKPHOS 50 12/20/2022 0824   AST 19 12/20/2022 0824   ALT 43 12/20/2022 0824   BILITOT 0.2 12/20/2022 0824

## 2022-12-20 NOTE — Patient Instructions (Signed)
VISIT SUMMARY:  During your visit, we discussed your ongoing treatment for small cell lung cancer, your recent bout with bronchitis, and your efforts to quit smoking. You reported increased energy levels and activity, which is a positive sign of your response to the treatment. We also addressed your side effects, including hair loss and migraines, and discussed potential future treatments.  YOUR PLAN:  -SMALL CELL LUNG CANCER: Small cell lung cancer is a fast-growing type of lung cancer that requires aggressive treatment. You are currently undergoing chemotherapy and radiation therapy, and your increased energy levels suggest a positive response. We will continue with your current treatment plan and check your labs before each chemotherapy session. For your migraines, you can continue using Tylenol and coffee as needed.  -BRONCHITIS: Bronchitis is an inflammation of the bronchial tubes, often causing coughing and difficulty breathing. You were recently treated with a Z-Pak, steroid shot, and antibiotic shot, and you have recovered well. No further action is required at this time.  -SMOKING: Smoking is harmful, especially with your lung cancer diagnosis. Although you have reduced your smoking frequency, it is important to quit completely. We encourage you to try quitting cold [place], as previous medications have triggered migraines.  -POTENTIAL FUTURE TREATMENT: We discussed the potential use of durvalumab, an immunotherapy, after you completing your planned treatment cycle. This will depend on the results of your upcoming CT scan.  INSTRUCTIONS:  Please follow up in three weeks to monitor your progress and any side effects from the treatment. Continue attending your chemotherapy and radiation sessions, and remember to get your labs checked before each chemotherapy session. If you experience any new or worsening symptoms, please contact our office immediately.

## 2022-12-21 ENCOUNTER — Inpatient Hospital Stay: Payer: Medicare HMO

## 2022-12-21 ENCOUNTER — Other Ambulatory Visit: Payer: Self-pay

## 2022-12-21 VITALS — BP 131/76 | HR 90 | Temp 97.9°F | Resp 20

## 2022-12-21 DIAGNOSIS — J449 Chronic obstructive pulmonary disease, unspecified: Secondary | ICD-10-CM | POA: Diagnosis not present

## 2022-12-21 DIAGNOSIS — C7801 Secondary malignant neoplasm of right lung: Secondary | ICD-10-CM

## 2022-12-21 DIAGNOSIS — Z5189 Encounter for other specified aftercare: Secondary | ICD-10-CM | POA: Diagnosis not present

## 2022-12-21 DIAGNOSIS — C771 Secondary and unspecified malignant neoplasm of intrathoracic lymph nodes: Secondary | ICD-10-CM | POA: Diagnosis not present

## 2022-12-21 DIAGNOSIS — Z780 Asymptomatic menopausal state: Secondary | ICD-10-CM | POA: Diagnosis not present

## 2022-12-21 DIAGNOSIS — R928 Other abnormal and inconclusive findings on diagnostic imaging of breast: Secondary | ICD-10-CM | POA: Diagnosis not present

## 2022-12-21 DIAGNOSIS — I10 Essential (primary) hypertension: Secondary | ICD-10-CM | POA: Diagnosis not present

## 2022-12-21 DIAGNOSIS — D3502 Benign neoplasm of left adrenal gland: Secondary | ICD-10-CM | POA: Diagnosis not present

## 2022-12-21 DIAGNOSIS — C3491 Malignant neoplasm of unspecified part of right bronchus or lung: Secondary | ICD-10-CM | POA: Diagnosis not present

## 2022-12-21 DIAGNOSIS — D3501 Benign neoplasm of right adrenal gland: Secondary | ICD-10-CM | POA: Diagnosis not present

## 2022-12-21 DIAGNOSIS — C3480 Malignant neoplasm of overlapping sites of unspecified bronchus and lung: Secondary | ICD-10-CM | POA: Diagnosis not present

## 2022-12-21 DIAGNOSIS — Z5111 Encounter for antineoplastic chemotherapy: Secondary | ICD-10-CM | POA: Diagnosis not present

## 2022-12-21 DIAGNOSIS — Z510 Encounter for antineoplastic radiation therapy: Secondary | ICD-10-CM | POA: Diagnosis not present

## 2022-12-21 DIAGNOSIS — F17210 Nicotine dependence, cigarettes, uncomplicated: Secondary | ICD-10-CM | POA: Diagnosis not present

## 2022-12-21 DIAGNOSIS — C7802 Secondary malignant neoplasm of left lung: Secondary | ICD-10-CM | POA: Diagnosis not present

## 2022-12-21 MED ORDER — DEXAMETHASONE SODIUM PHOSPHATE 100 MG/10ML IJ SOLN: 10.0000 mg | Freq: Once | INTRAMUSCULAR | Status: DC

## 2022-12-21 MED ORDER — SODIUM CHLORIDE 0.9 % IV SOLN
100.0000 mg/m2 | Freq: Once | INTRAVENOUS | Status: AC
  Administered 2022-12-21: 152 mg via INTRAVENOUS
  Filled 2022-12-21: qty 7.6

## 2022-12-21 MED ORDER — DEXAMETHASONE SODIUM PHOSPHATE 10 MG/ML IJ SOLN
10.0000 mg | Freq: Once | INTRAMUSCULAR | Status: AC
Start: 2022-12-21 — End: 2022-12-21
  Administered 2022-12-21: 10 mg via INTRAVENOUS
  Filled 2022-12-21: qty 1

## 2022-12-21 MED ORDER — SODIUM CHLORIDE 0.9 % IV SOLN: INTRAVENOUS | Status: DC

## 2022-12-21 MED ORDER — HEPARIN SOD (PORK) LOCK FLUSH 100 UNIT/ML IV SOLN
500.0000 [IU] | Freq: Once | INTRAVENOUS | Status: AC | PRN
  Administered 2022-12-21: 500 [IU]

## 2022-12-21 NOTE — Patient Instructions (Signed)
[facility] - A DEPT OF [facility]  Discharge Instructions: Thank you for choosing [facility] to provide your oncology and hematology care.  If you have a lab appointment with the Cancer Center - please note that after April 8th, 2024, all labs will be drawn in the cancer center.  You do not have to check in or register with the main entrance as you have in the past but will complete your check-in in the cancer center.  Wear comfortable clothing and clothing appropriate for easy access to any Portacath or PICC line.   We strive to give you quality time with your provider. You may need to reschedule your appointment if you arrive late (15 or more minutes).  Arriving late affects you and other patients whose appointments are after yours.  Also, if you miss three or more appointments without notifying the office, you may be dismissed from the clinic at the provider's discretion.      For prescription refill requests, have your pharmacy contact our office and allow 72 hours for refills to be completed.    Today you received the following chemotherapy and/or immunotherapy agents Etoposide    To help prevent nausea and vomiting after your treatment, we encourage you to take your nausea medication as directed.  BELOW ARE SYMPTOMS THAT SHOULD BE REPORTED IMMEDIATELY: *FEVER GREATER THAN 100.4 F (38 C) OR HIGHER *CHILLS OR SWEATING *NAUSEA AND VOMITING THAT IS NOT CONTROLLED WITH YOUR NAUSEA MEDICATION *UNUSUAL SHORTNESS OF BREATH *UNUSUAL BRUISING OR BLEEDING *URINARY PROBLEMS (pain or burning when urinating, or frequent urination) *BOWEL PROBLEMS (unusual diarrhea, constipation, pain near the anus) TENDERNESS IN MOUTH AND THROAT WITH OR WITHOUT PRESENCE OF ULCERS (sore throat, sores in mouth, or a toothache) UNUSUAL RASH, SWELLING OR PAIN  UNUSUAL VAGINAL DISCHARGE OR ITCHING   Items with * indicate a potential emergency and should be followed up  as soon as possible or go to the Emergency Department if any problems should occur.  Please show the CHEMOTHERAPY ALERT CARD or IMMUNOTHERAPY ALERT CARD at check-in to the Emergency Department and triage nurse.  Should you have questions after your visit or need to cancel or reschedule your appointment, please contact [facility] - A DEPT OF [facility] [phone]  and follow the prompts.  Office hours are 8:00 a.m. to 4:30 p.m. Monday - Friday. Please note that voicemails left after 4:00 p.m. may not be returned until the following business day.  We are closed weekends and major holidays. You have access to a nurse at all times for urgent questions. Please call the main number to the clinic [phone] and follow the prompts.  For any non-urgent questions, you may also contact your provider using MyChart. We now offer e-Visits for anyone 3 and older to request care online for non-urgent symptoms. For details visit [email or web address].   Also download the MyChart app! Go to the app store, search "MyChart", open the app, select [place], and log in with your MyChart username and password.

## 2022-12-21 NOTE — Progress Notes (Signed)
Pt voiced to RN that she has been having swelling in her legs and under her eyes after treatment but is managed well with her diuretics. Per pt MD is aware and she has no concerns or complications that are new that needs to be addressed to MD at this time. RN educated pt on the importance of notifying the clinic if any complications occur and when to seek emergency care. Pt verbalized understanding and all questions answered at this time.   Patient tolerated chemotherapy with no complaints voiced.  Side effects with management reviewed with understanding verbalized.  Port site clean and dry with no bruising or swelling noted at site.  Good blood return noted before and after administration of chemotherapy.  Band aid applied.  Patient left in satisfactory condition with VSS and no s/s of distress noted. All follow ups as scheduled.  Alanah Sakuma [employer]

## 2022-12-22 ENCOUNTER — Inpatient Hospital Stay: Payer: Medicare HMO

## 2022-12-22 VITALS — BP 137/83 | HR 86 | Temp 97.9°F | Resp 20

## 2022-12-22 DIAGNOSIS — Z5111 Encounter for antineoplastic chemotherapy: Secondary | ICD-10-CM | POA: Diagnosis not present

## 2022-12-22 DIAGNOSIS — R928 Other abnormal and inconclusive findings on diagnostic imaging of breast: Secondary | ICD-10-CM | POA: Diagnosis not present

## 2022-12-22 DIAGNOSIS — Z780 Asymptomatic menopausal state: Secondary | ICD-10-CM | POA: Diagnosis not present

## 2022-12-22 DIAGNOSIS — D3501 Benign neoplasm of right adrenal gland: Secondary | ICD-10-CM | POA: Diagnosis not present

## 2022-12-22 DIAGNOSIS — C7801 Secondary malignant neoplasm of right lung: Secondary | ICD-10-CM

## 2022-12-22 DIAGNOSIS — I10 Essential (primary) hypertension: Secondary | ICD-10-CM | POA: Diagnosis not present

## 2022-12-22 DIAGNOSIS — F17210 Nicotine dependence, cigarettes, uncomplicated: Secondary | ICD-10-CM | POA: Diagnosis not present

## 2022-12-22 DIAGNOSIS — C3480 Malignant neoplasm of overlapping sites of unspecified bronchus and lung: Secondary | ICD-10-CM | POA: Diagnosis not present

## 2022-12-22 DIAGNOSIS — Z5189 Encounter for other specified aftercare: Secondary | ICD-10-CM | POA: Diagnosis not present

## 2022-12-22 DIAGNOSIS — Z510 Encounter for antineoplastic radiation therapy: Secondary | ICD-10-CM | POA: Diagnosis not present

## 2022-12-22 DIAGNOSIS — C771 Secondary and unspecified malignant neoplasm of intrathoracic lymph nodes: Secondary | ICD-10-CM | POA: Diagnosis not present

## 2022-12-22 DIAGNOSIS — C3491 Malignant neoplasm of unspecified part of right bronchus or lung: Secondary | ICD-10-CM | POA: Diagnosis not present

## 2022-12-22 DIAGNOSIS — C7802 Secondary malignant neoplasm of left lung: Secondary | ICD-10-CM | POA: Diagnosis not present

## 2022-12-22 DIAGNOSIS — D3502 Benign neoplasm of left adrenal gland: Secondary | ICD-10-CM | POA: Diagnosis not present

## 2022-12-22 DIAGNOSIS — J449 Chronic obstructive pulmonary disease, unspecified: Secondary | ICD-10-CM | POA: Diagnosis not present

## 2022-12-22 MED ORDER — DEXAMETHASONE SODIUM PHOSPHATE 10 MG/ML IJ SOLN
10.0000 mg | Freq: Once | INTRAMUSCULAR | Status: AC
Start: 2022-12-22 — End: 2022-12-22
  Administered 2022-12-22: 10 mg via INTRAVENOUS
  Filled 2022-12-22: qty 1

## 2022-12-22 MED ORDER — SODIUM CHLORIDE 0.9 % IV SOLN: INTRAVENOUS | Status: DC

## 2022-12-22 MED ORDER — SODIUM CHLORIDE 0.9 % IV SOLN
100.0000 mg/m2 | Freq: Once | INTRAVENOUS | Status: AC
  Administered 2022-12-22: 152 mg via INTRAVENOUS
  Filled 2022-12-22: qty 7.6

## 2022-12-22 MED ORDER — SODIUM CHLORIDE 0.9% FLUSH
10.0000 mL | INTRAVENOUS | Status: DC | PRN
Start: 2022-12-22 — End: 2022-12-22
  Administered 2022-12-22: 10 mL

## 2022-12-22 MED ORDER — DEXAMETHASONE SODIUM PHOSPHATE 100 MG/10ML IJ SOLN: 10.0000 mg | Freq: Once | INTRAMUSCULAR | Status: DC

## 2022-12-22 MED ORDER — HEPARIN SOD (PORK) LOCK FLUSH 100 UNIT/ML IV SOLN
500.0000 [IU] | Freq: Once | INTRAVENOUS | Status: AC | PRN
Start: 2022-12-22 — End: 2022-12-22
  Administered 2022-12-22: 500 [IU]

## 2022-12-22 NOTE — Progress Notes (Signed)
Patient presents today for D3 Etoposide per provider's order. Vital signs stable and pt voiced no new complaints at this time.   Treatment given today per MD orders. Tolerated infusion without adverse affects. Vital signs stable. No complaints at this time. Discharged from clinic ambulatory in stable condition. Alert and oriented x 3. F/U with [facility] as scheduled.

## 2022-12-22 NOTE — Patient Instructions (Signed)
[facility] - [facility]  Discharge Instructions: Thank you for choosing [facility] to provide your oncology and hematology care.  If you have a lab appointment with the Cancer Center - please note that after April 8th, 2024, all labs will be drawn in the cancer center.  You do not have to check in or register with the main entrance as you have in the past but will complete your check-in in the cancer center.  Wear comfortable clothing and clothing appropriate for easy access to any Portacath or PICC line.   We strive to give you quality time with your provider. You may need to reschedule your appointment if you arrive late (15 or more minutes).  Arriving late affects you and other patients whose appointments are after yours.  Also, if you miss three or more appointments without notifying the office, you may be dismissed from the clinic at the provider's discretion.      For prescription refill requests, have your pharmacy contact our office and allow 72 hours for refills to be completed.    Today you received the following chemotherapy and/or immunotherapy agents D3 Etoposide   To help prevent nausea and vomiting after your treatment, we encourage you to take your nausea medication as directed.  Etoposide Injection What is this medication? ETOPOSIDE (e toe POE side) treats some types of cancer. It works by slowing down the growth of cancer cells. This medicine may be used for other purposes; ask your health care provider or pharmacist if you have questions. COMMON BRAND NAME(S): Etopophos, Toposar, VePesid What should I tell my care team before I take this medication? They need to know if you have any of these conditions: Infection Kidney disease Liver disease Low blood counts, such as low white cell, platelet, red cell counts An unusual or allergic reaction to etoposide, other medications, foods, dyes, or preservatives If you or  your partner are pregnant or trying to get pregnant Breastfeeding How should I use this medication? This medication is injected into a vein. It is given by your care team in a hospital or clinic setting. Talk to your care team about the use of this medication in children. Special care may be needed. Overdosage: If you think you have taken too much of this medicine contact a poison control center or emergency room at once. NOTE: This medicine is only for you. Do not share this medicine with others. What if I miss a dose? Keep appointments for follow-up doses. It is important not to miss your dose. Call your care team if you are unable to keep an appointment. What may interact with this medication? Warfarin This list may not describe all possible interactions. Give your health care provider a list of all the medicines, herbs, non-prescription drugs, or dietary supplements you use. Also tell them if you smoke, drink alcohol, or use illegal drugs. Some items may interact with your medicine. What should I watch for while using this medication? Your condition will be monitored carefully while you are receiving this medication. This medication may make you feel generally unwell. This is not uncommon as chemotherapy can affect healthy cells as well as cancer cells. Report any side effects. Continue your course of treatment even though you feel ill unless your care team tells you to stop. This medication can cause serious side effects. To reduce the risk, your care team may give you other medications to take before receiving this  one. Be sure to follow the directions from your care team. This medication may increase your risk of getting an infection. Call your care team for advice if you get a fever, chills, sore throat, or other symptoms of a cold or flu. Do not treat yourself. Try to avoid being around people who are sick. This medication may increase your risk to bruise or bleed. Call your care team if  you notice any unusual bleeding. Talk to your care team about your risk of cancer. You may be more at risk for certain types of cancers if you take this medication. Talk to your care team if you may be pregnant. Serious birth defects can occur if you take this medication during pregnancy and for 6 months after the last dose. You will need a negative pregnancy test before starting this medication. Contraception is recommended while taking this medication and for 6 months after the last dose. Your care team can help you find the option that works for you. If your partner can get pregnant, use a condom during sex while taking this medication and for 4 months after the last dose. Do not breastfeed while taking this medication. This medication may cause infertility. Talk to your care team if you are concerned about your fertility. What side effects may I notice from receiving this medication? Side effects that you should report to your care team as soon as possible: Allergic reactions--skin rash, itching, hives, swelling of the face, lips, tongue, or throat Infection--fever, chills, cough, sore throat, wounds that don't heal, pain or trouble when passing urine, general feeling of discomfort or being unwell Low red blood cell level--unusual weakness or fatigue, dizziness, headache, trouble breathing Unusual bruising or bleeding Side effects that usually do not require medical attention (report to your care team if they continue or are bothersome): Diarrhea Fatigue Hair loss Loss of appetite Nausea Vomiting This list may not describe all possible side effects. Call your doctor for medical advice about side effects. You may report side effects to FDA at 1-800-FDA-1088. Where should I keep my medication? This medication is given in a hospital or clinic. It will not be stored at home. NOTE: This sheet is a summary. It may not cover all possible information. If you have questions about this medicine, talk  to your doctor, pharmacist, or health care provider.  2024 Elsevier/Gold Standard (2021-06-25 00:00:00)   BELOW ARE SYMPTOMS THAT SHOULD BE REPORTED IMMEDIATELY: *FEVER GREATER THAN 100.4 F (38 C) OR HIGHER *CHILLS OR SWEATING *NAUSEA AND VOMITING THAT IS NOT CONTROLLED WITH YOUR NAUSEA MEDICATION *UNUSUAL SHORTNESS OF BREATH *UNUSUAL BRUISING OR BLEEDING *URINARY PROBLEMS (pain or burning when urinating, or frequent urination) *BOWEL PROBLEMS (unusual diarrhea, constipation, pain near the anus) TENDERNESS IN MOUTH AND THROAT WITH OR WITHOUT PRESENCE OF ULCERS (sore throat, sores in mouth, or a toothache) UNUSUAL RASH, SWELLING OR PAIN  UNUSUAL VAGINAL DISCHARGE OR ITCHING   Items with * indicate a potential emergency and should be followed up as soon as possible or go to the Emergency Department if any problems should occur.  Please show the CHEMOTHERAPY ALERT CARD or IMMUNOTHERAPY ALERT CARD at check-in to the Emergency Department and triage nurse.  Should you have questions after your visit or need to cancel or reschedule your appointment, please contact [facility] - A DEPT OF [facility] Bridegroom [facility] [phone]  and follow the prompts.  Office hours are 8:00 a.m. to 4:30 p.m. Monday - Friday. Please note that voicemails  left after 4:00 p.m. may not be returned until the following business day.  We are closed weekends and major holidays. You have access to a nurse at all times for urgent questions. Please call the main number to the clinic [phone] and follow the prompts.  For any non-urgent questions, you may also contact your provider using MyChart. We now offer e-Visits for anyone 23 and older to request care online for non-urgent symptoms. For details visit [email or web address].   Also download the MyChart app! Go to the app store, search "MyChart", open the app, select [place], and log in with your MyChart username and password.

## 2022-12-23 DIAGNOSIS — Z510 Encounter for antineoplastic radiation therapy: Secondary | ICD-10-CM | POA: Diagnosis not present

## 2022-12-23 DIAGNOSIS — I10 Essential (primary) hypertension: Secondary | ICD-10-CM | POA: Diagnosis not present

## 2022-12-23 DIAGNOSIS — C771 Secondary and unspecified malignant neoplasm of intrathoracic lymph nodes: Secondary | ICD-10-CM | POA: Diagnosis not present

## 2022-12-23 DIAGNOSIS — J449 Chronic obstructive pulmonary disease, unspecified: Secondary | ICD-10-CM | POA: Diagnosis not present

## 2022-12-23 DIAGNOSIS — F17210 Nicotine dependence, cigarettes, uncomplicated: Secondary | ICD-10-CM | POA: Diagnosis not present

## 2022-12-23 DIAGNOSIS — R928 Other abnormal and inconclusive findings on diagnostic imaging of breast: Secondary | ICD-10-CM | POA: Diagnosis not present

## 2022-12-23 DIAGNOSIS — C3480 Malignant neoplasm of overlapping sites of unspecified bronchus and lung: Secondary | ICD-10-CM | POA: Diagnosis not present

## 2022-12-23 DIAGNOSIS — Z780 Asymptomatic menopausal state: Secondary | ICD-10-CM | POA: Diagnosis not present

## 2022-12-24 ENCOUNTER — Other Ambulatory Visit: Payer: Self-pay | Admitting: Oncology

## 2022-12-24 ENCOUNTER — Inpatient Hospital Stay: Payer: Medicare HMO

## 2022-12-24 VITALS — BP 107/69 | HR 101 | Temp 97.5°F | Resp 20

## 2022-12-24 DIAGNOSIS — Z5189 Encounter for other specified aftercare: Secondary | ICD-10-CM | POA: Diagnosis not present

## 2022-12-24 DIAGNOSIS — D3501 Benign neoplasm of right adrenal gland: Secondary | ICD-10-CM | POA: Diagnosis not present

## 2022-12-24 DIAGNOSIS — C7801 Secondary malignant neoplasm of right lung: Secondary | ICD-10-CM

## 2022-12-24 DIAGNOSIS — Z780 Asymptomatic menopausal state: Secondary | ICD-10-CM | POA: Diagnosis not present

## 2022-12-24 DIAGNOSIS — C3491 Malignant neoplasm of unspecified part of right bronchus or lung: Secondary | ICD-10-CM | POA: Diagnosis not present

## 2022-12-24 DIAGNOSIS — Z510 Encounter for antineoplastic radiation therapy: Secondary | ICD-10-CM | POA: Diagnosis not present

## 2022-12-24 DIAGNOSIS — C3480 Malignant neoplasm of overlapping sites of unspecified bronchus and lung: Secondary | ICD-10-CM | POA: Diagnosis not present

## 2022-12-24 DIAGNOSIS — Z5111 Encounter for antineoplastic chemotherapy: Secondary | ICD-10-CM | POA: Diagnosis not present

## 2022-12-24 DIAGNOSIS — I10 Essential (primary) hypertension: Secondary | ICD-10-CM | POA: Diagnosis not present

## 2022-12-24 DIAGNOSIS — R928 Other abnormal and inconclusive findings on diagnostic imaging of breast: Secondary | ICD-10-CM | POA: Diagnosis not present

## 2022-12-24 DIAGNOSIS — D3502 Benign neoplasm of left adrenal gland: Secondary | ICD-10-CM | POA: Diagnosis not present

## 2022-12-24 DIAGNOSIS — C7802 Secondary malignant neoplasm of left lung: Secondary | ICD-10-CM | POA: Diagnosis not present

## 2022-12-24 DIAGNOSIS — F17210 Nicotine dependence, cigarettes, uncomplicated: Secondary | ICD-10-CM | POA: Diagnosis not present

## 2022-12-24 DIAGNOSIS — J449 Chronic obstructive pulmonary disease, unspecified: Secondary | ICD-10-CM | POA: Diagnosis not present

## 2022-12-24 DIAGNOSIS — C771 Secondary and unspecified malignant neoplasm of intrathoracic lymph nodes: Secondary | ICD-10-CM | POA: Diagnosis not present

## 2022-12-24 MED ORDER — PEGFILGRASTIM-JMDB 6 MG/0.6ML ~~LOC~~ SOSY
6.0000 mg | PREFILLED_SYRINGE | Freq: Once | SUBCUTANEOUS | Status: AC
Start: 2022-12-24 — End: 2022-12-24
  Administered 2022-12-24: 6 mg via SUBCUTANEOUS
  Filled 2022-12-24: qty 0.6

## 2022-12-24 NOTE — Progress Notes (Signed)
Patient presents today for Fulphila injection per providers order.  Vital signs WNL.  Patient has no new complaints at this time.  Stable during administration without incident; injection site WNL; see MAR for injection details.  Patient tolerated procedure well and without incident.  No questions or complaints noted at this time.  

## 2022-12-24 NOTE — Patient Instructions (Signed)
[facility]  Discharge Instructions: Thank you for choosing [facility] to provide your oncology and hematology care.  If you have a lab appointment with the Cancer Center - please note that after April 8th, 2024, all labs will be drawn in the cancer center.  You do not have to check in or register with the main entrance as you have in the past but will complete your check-in in the cancer center.  Wear comfortable clothing and clothing appropriate for easy access to any Portacath or PICC line.   We strive to give you quality time with your provider. You may need to reschedule your appointment if you arrive late (15 or more minutes).  Arriving late affects you and other patients whose appointments are after yours.  Also, if you miss three or more appointments without notifying the office, you may be dismissed from the clinic at the provider's discretion.      For prescription refill requests, have your pharmacy contact our office and allow 72 hours for refills to be completed.    Today you received the following chemotherapy and/or immunotherapy agents Fulphila      To help prevent nausea and vomiting after your treatment, we encourage you to take your nausea medication as directed.  BELOW ARE SYMPTOMS THAT SHOULD BE REPORTED IMMEDIATELY: *FEVER GREATER THAN 100.4 F (38 C) OR HIGHER *CHILLS OR SWEATING *NAUSEA AND VOMITING THAT IS NOT CONTROLLED WITH YOUR NAUSEA MEDICATION *UNUSUAL SHORTNESS OF BREATH *UNUSUAL BRUISING OR BLEEDING *URINARY PROBLEMS (pain or burning when urinating, or frequent urination) *BOWEL PROBLEMS (unusual diarrhea, constipation, pain near the anus) TENDERNESS IN MOUTH AND THROAT WITH OR WITHOUT PRESENCE OF ULCERS (sore throat, sores in mouth, or a toothache) UNUSUAL RASH, SWELLING OR PAIN  UNUSUAL VAGINAL DISCHARGE OR ITCHING   Items with * indicate a potential emergency and should be followed up  as soon as possible or go to the Emergency Department if any problems should occur.  Please show the CHEMOTHERAPY ALERT CARD or IMMUNOTHERAPY ALERT CARD at check-in to the Emergency Department and triage nurse.  Should you have questions after your visit or need to cancel or reschedule your appointment, please contact [facility] - A DEPT OF [facility]ide[facility]room [facility] [phone]  and follow the prompts.  Office hours are 8:00 a.m. to 4:30 p.m. Monday - Friday. Please note that voicemails left after 4:00 p.m. may not be returned until the following business day.  We are closed weekends and major holidays. You have access to a nurse at all times for urgent questions. Please call the main number to the clinic [phone] and follow the prompts.  For any non-urgent questions, you may also contact your provider using MyChart. We now offer e-Visits for anyone 35 and older to request care online for non-urgent symptoms. For details visit [email or web address].   Also download the MyChart app! Go to the app store, search "MyChart", open the app, select [place], and log in with your MyChart username and password.

## 2022-12-27 DIAGNOSIS — C771 Secondary and unspecified malignant neoplasm of intrathoracic lymph nodes: Secondary | ICD-10-CM | POA: Diagnosis not present

## 2022-12-27 DIAGNOSIS — J449 Chronic obstructive pulmonary disease, unspecified: Secondary | ICD-10-CM | POA: Diagnosis not present

## 2022-12-27 DIAGNOSIS — Z780 Asymptomatic menopausal state: Secondary | ICD-10-CM | POA: Diagnosis not present

## 2022-12-27 DIAGNOSIS — I10 Essential (primary) hypertension: Secondary | ICD-10-CM | POA: Diagnosis not present

## 2022-12-27 DIAGNOSIS — Z510 Encounter for antineoplastic radiation therapy: Secondary | ICD-10-CM | POA: Diagnosis not present

## 2022-12-27 DIAGNOSIS — F17210 Nicotine dependence, cigarettes, uncomplicated: Secondary | ICD-10-CM | POA: Diagnosis not present

## 2022-12-27 DIAGNOSIS — R928 Other abnormal and inconclusive findings on diagnostic imaging of breast: Secondary | ICD-10-CM | POA: Diagnosis not present

## 2022-12-27 DIAGNOSIS — C3480 Malignant neoplasm of overlapping sites of unspecified bronchus and lung: Secondary | ICD-10-CM | POA: Diagnosis not present

## 2022-12-28 DIAGNOSIS — C771 Secondary and unspecified malignant neoplasm of intrathoracic lymph nodes: Secondary | ICD-10-CM | POA: Diagnosis not present

## 2022-12-28 DIAGNOSIS — C3490 Malignant neoplasm of unspecified part of unspecified bronchus or lung: Secondary | ICD-10-CM | POA: Diagnosis not present

## 2022-12-28 DIAGNOSIS — Z780 Asymptomatic menopausal state: Secondary | ICD-10-CM | POA: Diagnosis not present

## 2022-12-28 DIAGNOSIS — I10 Essential (primary) hypertension: Secondary | ICD-10-CM | POA: Diagnosis not present

## 2022-12-28 DIAGNOSIS — Z510 Encounter for antineoplastic radiation therapy: Secondary | ICD-10-CM | POA: Diagnosis not present

## 2022-12-28 DIAGNOSIS — R928 Other abnormal and inconclusive findings on diagnostic imaging of breast: Secondary | ICD-10-CM | POA: Diagnosis not present

## 2022-12-28 DIAGNOSIS — J449 Chronic obstructive pulmonary disease, unspecified: Secondary | ICD-10-CM | POA: Diagnosis not present

## 2022-12-28 DIAGNOSIS — C3480 Malignant neoplasm of overlapping sites of unspecified bronchus and lung: Secondary | ICD-10-CM | POA: Diagnosis not present

## 2022-12-28 DIAGNOSIS — F17210 Nicotine dependence, cigarettes, uncomplicated: Secondary | ICD-10-CM | POA: Diagnosis not present

## 2022-12-29 DIAGNOSIS — I10 Essential (primary) hypertension: Secondary | ICD-10-CM | POA: Diagnosis not present

## 2022-12-29 DIAGNOSIS — Z510 Encounter for antineoplastic radiation therapy: Secondary | ICD-10-CM | POA: Diagnosis not present

## 2022-12-29 DIAGNOSIS — Z780 Asymptomatic menopausal state: Secondary | ICD-10-CM | POA: Diagnosis not present

## 2022-12-29 DIAGNOSIS — J449 Chronic obstructive pulmonary disease, unspecified: Secondary | ICD-10-CM | POA: Diagnosis not present

## 2022-12-29 DIAGNOSIS — C771 Secondary and unspecified malignant neoplasm of intrathoracic lymph nodes: Secondary | ICD-10-CM | POA: Diagnosis not present

## 2022-12-29 DIAGNOSIS — R928 Other abnormal and inconclusive findings on diagnostic imaging of breast: Secondary | ICD-10-CM | POA: Diagnosis not present

## 2022-12-29 DIAGNOSIS — C3480 Malignant neoplasm of overlapping sites of unspecified bronchus and lung: Secondary | ICD-10-CM | POA: Diagnosis not present

## 2022-12-29 DIAGNOSIS — F17210 Nicotine dependence, cigarettes, uncomplicated: Secondary | ICD-10-CM | POA: Diagnosis not present

## 2022-12-30 DIAGNOSIS — C3480 Malignant neoplasm of overlapping sites of unspecified bronchus and lung: Secondary | ICD-10-CM | POA: Diagnosis not present

## 2022-12-30 DIAGNOSIS — F17210 Nicotine dependence, cigarettes, uncomplicated: Secondary | ICD-10-CM | POA: Diagnosis not present

## 2022-12-30 DIAGNOSIS — I10 Essential (primary) hypertension: Secondary | ICD-10-CM | POA: Diagnosis not present

## 2022-12-30 DIAGNOSIS — Z780 Asymptomatic menopausal state: Secondary | ICD-10-CM | POA: Diagnosis not present

## 2022-12-30 DIAGNOSIS — J449 Chronic obstructive pulmonary disease, unspecified: Secondary | ICD-10-CM | POA: Diagnosis not present

## 2022-12-30 DIAGNOSIS — C3490 Malignant neoplasm of unspecified part of unspecified bronchus or lung: Secondary | ICD-10-CM | POA: Diagnosis not present

## 2022-12-30 DIAGNOSIS — R928 Other abnormal and inconclusive findings on diagnostic imaging of breast: Secondary | ICD-10-CM | POA: Diagnosis not present

## 2022-12-30 DIAGNOSIS — Z510 Encounter for antineoplastic radiation therapy: Secondary | ICD-10-CM | POA: Diagnosis not present

## 2022-12-30 DIAGNOSIS — C771 Secondary and unspecified malignant neoplasm of intrathoracic lymph nodes: Secondary | ICD-10-CM | POA: Diagnosis not present

## 2022-12-31 DIAGNOSIS — C771 Secondary and unspecified malignant neoplasm of intrathoracic lymph nodes: Secondary | ICD-10-CM | POA: Diagnosis not present

## 2022-12-31 DIAGNOSIS — C3480 Malignant neoplasm of overlapping sites of unspecified bronchus and lung: Secondary | ICD-10-CM | POA: Diagnosis not present

## 2022-12-31 DIAGNOSIS — I10 Essential (primary) hypertension: Secondary | ICD-10-CM | POA: Diagnosis not present

## 2022-12-31 DIAGNOSIS — J449 Chronic obstructive pulmonary disease, unspecified: Secondary | ICD-10-CM | POA: Diagnosis not present

## 2022-12-31 DIAGNOSIS — Z510 Encounter for antineoplastic radiation therapy: Secondary | ICD-10-CM | POA: Diagnosis not present

## 2022-12-31 DIAGNOSIS — Z780 Asymptomatic menopausal state: Secondary | ICD-10-CM | POA: Diagnosis not present

## 2022-12-31 DIAGNOSIS — F17210 Nicotine dependence, cigarettes, uncomplicated: Secondary | ICD-10-CM | POA: Diagnosis not present

## 2022-12-31 DIAGNOSIS — R928 Other abnormal and inconclusive findings on diagnostic imaging of breast: Secondary | ICD-10-CM | POA: Diagnosis not present

## 2023-01-03 NOTE — Addendum Note (Signed)
Encounter addended by: Edward Qualia on: 01/03/2023 9:21 AM  Actions taken: Imaging Exam ended

## 2023-01-04 DIAGNOSIS — J449 Chronic obstructive pulmonary disease, unspecified: Secondary | ICD-10-CM | POA: Diagnosis not present

## 2023-01-04 DIAGNOSIS — C3480 Malignant neoplasm of overlapping sites of unspecified bronchus and lung: Secondary | ICD-10-CM | POA: Diagnosis not present

## 2023-01-04 DIAGNOSIS — C771 Secondary and unspecified malignant neoplasm of intrathoracic lymph nodes: Secondary | ICD-10-CM | POA: Diagnosis not present

## 2023-01-04 DIAGNOSIS — R928 Other abnormal and inconclusive findings on diagnostic imaging of breast: Secondary | ICD-10-CM | POA: Diagnosis not present

## 2023-01-04 DIAGNOSIS — I10 Essential (primary) hypertension: Secondary | ICD-10-CM | POA: Diagnosis not present

## 2023-01-04 DIAGNOSIS — F17210 Nicotine dependence, cigarettes, uncomplicated: Secondary | ICD-10-CM | POA: Diagnosis not present

## 2023-01-04 DIAGNOSIS — Z510 Encounter for antineoplastic radiation therapy: Secondary | ICD-10-CM | POA: Diagnosis not present

## 2023-01-04 DIAGNOSIS — Z780 Asymptomatic menopausal state: Secondary | ICD-10-CM | POA: Diagnosis not present

## 2023-01-08 DIAGNOSIS — F17210 Nicotine dependence, cigarettes, uncomplicated: Secondary | ICD-10-CM | POA: Diagnosis not present

## 2023-01-08 DIAGNOSIS — Z681 Body mass index (BMI) 19 or less, adult: Secondary | ICD-10-CM | POA: Diagnosis not present

## 2023-01-08 DIAGNOSIS — J441 Chronic obstructive pulmonary disease with (acute) exacerbation: Secondary | ICD-10-CM | POA: Diagnosis not present

## 2023-01-08 DIAGNOSIS — R030 Elevated blood-pressure reading, without diagnosis of hypertension: Secondary | ICD-10-CM | POA: Diagnosis not present

## 2023-01-10 ENCOUNTER — Inpatient Hospital Stay: Payer: Medicare HMO | Admitting: Dietician

## 2023-01-10 ENCOUNTER — Inpatient Hospital Stay: Payer: Medicare HMO

## 2023-01-10 ENCOUNTER — Inpatient Hospital Stay: Payer: Medicare HMO | Admitting: Oncology

## 2023-01-10 VITALS — BP 110/73 | HR 107 | Temp 97.6°F | Resp 16 | Wt 108.5 lb

## 2023-01-10 VITALS — BP 125/74 | HR 97

## 2023-01-10 DIAGNOSIS — F17210 Nicotine dependence, cigarettes, uncomplicated: Secondary | ICD-10-CM | POA: Diagnosis not present

## 2023-01-10 DIAGNOSIS — C7801 Secondary malignant neoplasm of right lung: Secondary | ICD-10-CM

## 2023-01-10 DIAGNOSIS — D3501 Benign neoplasm of right adrenal gland: Secondary | ICD-10-CM | POA: Diagnosis not present

## 2023-01-10 DIAGNOSIS — D649 Anemia, unspecified: Secondary | ICD-10-CM | POA: Diagnosis not present

## 2023-01-10 DIAGNOSIS — C7802 Secondary malignant neoplasm of left lung: Secondary | ICD-10-CM | POA: Diagnosis not present

## 2023-01-10 DIAGNOSIS — R1310 Dysphagia, unspecified: Secondary | ICD-10-CM | POA: Insufficient documentation

## 2023-01-10 DIAGNOSIS — D3502 Benign neoplasm of left adrenal gland: Secondary | ICD-10-CM | POA: Diagnosis not present

## 2023-01-10 DIAGNOSIS — C3491 Malignant neoplasm of unspecified part of right bronchus or lung: Secondary | ICD-10-CM | POA: Diagnosis not present

## 2023-01-10 DIAGNOSIS — Z5111 Encounter for antineoplastic chemotherapy: Secondary | ICD-10-CM | POA: Diagnosis not present

## 2023-01-10 DIAGNOSIS — Z5189 Encounter for other specified aftercare: Secondary | ICD-10-CM | POA: Diagnosis not present

## 2023-01-10 LAB — COMPREHENSIVE METABOLIC PANEL
ALT: 37 U/L (ref 0–44)
AST: 21 U/L (ref 15–41)
Albumin: 3.3 g/dL — ABNORMAL LOW (ref 3.5–5.0)
Alkaline Phosphatase: 61 U/L (ref 38–126)
Anion gap: 9 (ref 5–15)
BUN: 31 mg/dL — ABNORMAL HIGH (ref 8–23)
CO2: 20 mmol/L — ABNORMAL LOW (ref 22–32)
Calcium: 9 mg/dL (ref 8.9–10.3)
Chloride: 103 mmol/L (ref 98–111)
Creatinine, Ser: 1.12 mg/dL — ABNORMAL HIGH (ref 0.44–1.00)
GFR, Estimated: 53 mL/min — ABNORMAL LOW (ref >60)
Glucose, Bld: 144 mg/dL — ABNORMAL HIGH (ref 70–99)
Potassium: 4 mmol/L (ref 3.5–5.1)
Sodium: 132 mmol/L — ABNORMAL LOW (ref 135–145)
Total Bilirubin: 0.7 mg/dL (ref <1.2)
Total Protein: 6.8 g/dL (ref 6.5–8.1)

## 2023-01-10 LAB — CBC WITH DIFFERENTIAL/PLATELET
Abs Immature Granulocytes: 0.1 10*3/uL — ABNORMAL HIGH (ref 0.00–0.07)
Basophils Absolute: 0.1 10*3/uL (ref 0.0–0.1)
Basophils Relative: 0 %
Eosinophils Absolute: 0.1 10*3/uL (ref 0.0–0.5)
Eosinophils Relative: 1 %
HCT: 27.1 % — ABNORMAL LOW (ref 36.0–46.0)
Hemoglobin: 9.1 g/dL — ABNORMAL LOW (ref 12.0–15.0)
Immature Granulocytes: 1 %
Lymphocytes Relative: 5 %
Lymphs Abs: 0.6 10*3/uL — ABNORMAL LOW (ref 0.7–4.0)
MCH: 33.1 pg (ref 26.0–34.0)
MCHC: 33.6 g/dL (ref 30.0–36.0)
MCV: 98.5 fL (ref 80.0–100.0)
Monocytes Absolute: 1 10*3/uL (ref 0.1–1.0)
Monocytes Relative: 7 %
Neutro Abs: 11.5 10*3/uL — ABNORMAL HIGH (ref 1.7–7.7)
Neutrophils Relative %: 86 %
Platelets: 498 10*3/uL — ABNORMAL HIGH (ref 150–400)
RBC: 2.75 MIL/uL — ABNORMAL LOW (ref 3.87–5.11)
RDW: 15.8 % — ABNORMAL HIGH (ref 11.5–15.5)
WBC: 13.3 10*3/uL — ABNORMAL HIGH (ref 4.0–10.5)
nRBC: 0 % (ref 0.0–0.2)

## 2023-01-10 LAB — IRON AND TIBC
Iron: 65 ug/dL (ref 28–170)
Saturation Ratios: 25 % (ref 10.4–31.8)
TIBC: 263 ug/dL (ref 250–450)
UIBC: 198 ug/dL

## 2023-01-10 LAB — MAGNESIUM: Magnesium: 1.9 mg/dL (ref 1.7–2.4)

## 2023-01-10 LAB — FERRITIN: Ferritin: 223 ng/mL (ref 11–307)

## 2023-01-10 MED ORDER — HEPARIN SOD (PORK) LOCK FLUSH 100 UNIT/ML IV SOLN
500.0000 [IU] | Freq: Once | INTRAVENOUS | Status: AC | PRN
Start: 2023-01-10 — End: 2023-01-10
  Administered 2023-01-10: 500 [IU]

## 2023-01-10 MED ORDER — POTASSIUM CHLORIDE IN NACL 20-0.9 MEQ/L-% IV SOLN
Freq: Once | INTRAVENOUS | Status: AC
  Filled 2023-01-10: qty 1000

## 2023-01-10 MED ORDER — DEXAMETHASONE SODIUM PHOSPHATE 10 MG/ML IJ SOLN
10.0000 mg | Freq: Once | INTRAMUSCULAR | Status: AC
Start: 2023-01-10 — End: 2023-01-10
  Administered 2023-01-10: 10 mg via INTRAVENOUS
  Filled 2023-01-10: qty 1

## 2023-01-10 MED ORDER — MAGNESIUM SULFATE 2 GM/50ML IV SOLN
2.0000 g | Freq: Once | INTRAVENOUS | Status: AC
  Administered 2023-01-10: 2 g via INTRAVENOUS
  Filled 2023-01-10: qty 50

## 2023-01-10 MED ORDER — SODIUM CHLORIDE 0.9 % IV SOLN
59.0000 mg/m2 | Freq: Once | INTRAVENOUS | Status: AC
  Administered 2023-01-10: 90 mg via INTRAVENOUS
  Filled 2023-01-10: qty 90

## 2023-01-10 MED ORDER — SODIUM CHLORIDE 0.9 % IV SOLN
Freq: Once | INTRAVENOUS | Status: AC
Start: 2023-01-10 — End: 2023-01-10

## 2023-01-10 MED ORDER — SODIUM CHLORIDE 0.9 % IV SOLN: INTRAVENOUS | Status: DC

## 2023-01-10 MED ORDER — SODIUM CHLORIDE 0.9 % IV SOLN
100.0000 mg/m2 | Freq: Once | INTRAVENOUS | Status: AC
  Administered 2023-01-10: 152 mg via INTRAVENOUS
  Filled 2023-01-10: qty 7.6

## 2023-01-10 MED ORDER — SUCRALFATE 1 GM/10ML PO SUSP: 1.0000 g | Freq: Three times a day (TID) | ORAL | 0 refills | Status: AC

## 2023-01-10 MED ORDER — SODIUM CHLORIDE 0.9 % IV SOLN: 10.0000 mg | Freq: Once | INTRAVENOUS | Status: DC

## 2023-01-10 MED ORDER — SODIUM CHLORIDE 0.9 % IV SOLN
150.0000 mg | Freq: Once | INTRAVENOUS | Status: AC
  Administered 2023-01-10: 150 mg via INTRAVENOUS
  Filled 2023-01-10: qty 5

## 2023-01-10 MED ORDER — PALONOSETRON HCL INJECTION 0.25 MG/5ML
0.2500 mg | Freq: Once | INTRAVENOUS | Status: AC
  Administered 2023-01-10: 0.25 mg via INTRAVENOUS
  Filled 2023-01-10: qty 5

## 2023-01-10 NOTE — Patient Instructions (Signed)
[facility]  Discharge Instructions: Thank you for choosing [facility] to provide your oncology and hematology care.  If you have a lab appointment with the Cancer Center - please note that after April 8th, 2024, all labs will be drawn in the cancer center.  You do not have to check in or register with the main entrance as you have in the past but will complete your check-in in the cancer center.  Wear comfortable clothing and clothing appropriate for easy access to any Portacath or PICC line.   We strive to give you quality time with your provider. You may need to reschedule your appointment if you arrive late (15 or more minutes).  Arriving late affects you and other patients whose appointments are after yours.  Also, if you miss three or more appointments without notifying the office, you may be dismissed from the clinic at the provider's discretion.      For prescription refill requests, have your pharmacy contact our office and allow 72 hours for refills to be completed.    Today you received the following chemotherapy and/or immunotherapy agents Cisplatin & Etoposide    To help prevent nausea and vomiting after your treatment, we encourage you to take your nausea medication as directed.  BELOW ARE SYMPTOMS THAT SHOULD BE REPORTED IMMEDIATELY: *FEVER GREATER THAN 100.4 F (38 C) OR HIGHER *CHILLS OR SWEATING *NAUSEA AND VOMITING THAT IS NOT CONTROLLED WITH YOUR NAUSEA MEDICATION *UNUSUAL SHORTNESS OF BREATH *UNUSUAL BRUISING OR BLEEDING *URINARY PROBLEMS (pain or burning when urinating, or frequent urination) *BOWEL PROBLEMS (unusual diarrhea, constipation, pain near the anus) TENDERNESS IN MOUTH AND THROAT WITH OR WITHOUT PRESENCE OF ULCERS (sore throat, sores in mouth, or a toothache) UNUSUAL RASH, SWELLING OR PAIN  UNUSUAL VAGINAL DISCHARGE OR ITCHING   Items with * indicate a potential emergency and should be  followed up as soon as possible or go to the Emergency Department if any problems should occur.  Please show the CHEMOTHERAPY ALERT CARD or IMMUNOTHERAPY ALERT CARD at check-in to the Emergency Department and triage nurse.  Should you have questions after your visit or need to cancel or reschedule your appointment, please contact [facility] - A DEPT OF [facility]idegroom [facility] [phone]  and follow the prompts.  Office hours are 8:00 a.m. to 4:30 p.m. Monday - Friday. Please note that voicemails left after 4:00 p.m. may not be returned until the following business day.  We are closed weekends and major holidays. You have access to a nurse at all times for urgent questions. Please call the main number to the clinic [phone] and follow the prompts.  For any non-urgent questions, you may also contact your provider using MyChart. We now offer e-Visits for anyone 31 and older to request care online for non-urgent symptoms. For details visit [email or web address].   Also download the MyChart app! Go to the app store, search "MyChart", open the app, select [place], and log in with your MyChart username and password.

## 2023-01-10 NOTE — Assessment & Plan Note (Addendum)
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 Currently in cycle 3 of chemotherapy with two more cycles planned.  -Labs reviewed today. Patient is good to go for chemotherapy -Will obtain a CT scan after completing 4 cycles  Discussed potential use of darvalumab (immunotherapy) after completion of 4 cycles of chemo, pending response to chemo RT  Return to clinic in 3 weeks for next treatment

## 2023-01-10 NOTE — Assessment & Plan Note (Signed)
-  Patient is an avid smoker with history of smoking 2 packs/day until recently -Patient continues to smoke -Discussed the risks of smoking including faster progression of cancer, inflammation causing difficulties with chemotherapy and radiation -Offered help to quit smoking, patient is reluctant at this time

## 2023-01-10 NOTE — Assessment & Plan Note (Signed)
Likely secondary to chemotherapy.  Iron panel and ferritin within normal limits -Continue to monitor for now

## 2023-01-10 NOTE — Progress Notes (Signed)
Virginia Powell presented for Portacath access and flush.  Portacath located right chest wall accessed with  H 20 needle.  Good blood return present. Portacath flushed with 20ml NS.  Procedure tolerated well and without incident.

## 2023-01-10 NOTE — Assessment & Plan Note (Signed)
Likely secondary to radiation -Will send prescription for Carafate as per nutrition

## 2023-01-10 NOTE — Progress Notes (Signed)
Patient Care Team: Lianne Moris, PA-C as PCP - General (Family Medicine) Wendall Stade, MD as PCP - Cardiology (Cardiology) Cindie Crumbly, MD as Medical Oncologist (Medical Oncology) Therese Sarah, RN as Oncology Nurse Navigator (Medical Oncology)  Clinic Day:  01/10/2023  Referring physician: Lianne Moris, PA-C   CHIEF COMPLAINT:  CC: Stage IVA Small cell lug carcinoma    ASSESSMENT & PLAN:   Assessment & Plan: Virginia Powell  is a 72 y.o. 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 Currently in cycle 3 of chemotherapy with two more cycles planned.  -Labs reviewed today. Patient is good to go for chemotherapy  Discussed potential use of darvalumab (immunotherapy) after completion of 4 cycles of chemo, pending response to chemo RT  Return to clinic in 3 weeks for next treatment  Dysphagia Likely secondary to radiation -Will send prescription for Carafate as per nutrition  Anemia Likely secondary to chemotherapy.  Iron panel and ferritin within normal limits -Continue to monitor for now  Smokes cigarettes -Patient is an avid smoker with history of smoking 2 packs/day until recently -Patient continues to smoke -Discussed the risks of smoking including faster progression of cancer, inflammation causing difficulties with chemotherapy and radiation -Offered help to quit smoking, patient is reluctant at this time     The patient understands the plans discussed today and is in agreement with them.  She knows to contact our office if she develops concerns prior to her next appointment.  I provided 20 minutes of face-to-face time during this encounter and > 50% was spent counseling as documented under my  assessment and plan.    Cindie Crumbly, MD  [facility] 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 Growing solid nodule of the right lower lobe measuring 8 mm and new irregular solid nodule of the left lower lobe measuring 9.3 mm. Lung-RADS 4B, suspicious. Additional imaging evaluation or consultation with Pulmonology or Thoracic Surgery recommended.   09/09/2022 PET scan   IMPRESSION: 1. Hypermetabolic right hilar adenopathy and bilateral pulmonary nodules, findings indicative of synchronous primary bronchogenic carcinomas. No evidence of distant metastatic disease. 2. Vague slight thickening of lateral breast soft tissue with metabolism just below blood pool. Consider mammographic correlation, as clinically indicated. 3. Slight marginal irregularity of the liver raises suspicion for cirrhosis. 4. Bilateral adrenal adenomas.   10/02/2022 Imaging   CT chest without contrast: IMPRESSION: 1. No significant change in the appearance of tracer avid nodules within the medial right lower lobe and superior segment of left lower lobe. 2. Stable appearance of tracer avid nodal conglomeration noted within the right hilum. Which is concerning for nodal metastasis. 3. Stable appearance of bilateral adrenal adenomas. No follow-up imaging recommended.   10/04/2022 Pathology Results   A. LUNG, LLL, FINE NEEDLE ASPIRATION:  - No malignant cells identified  - Granulomatous inflammation   B. LUNG, LLL, BRUSHING:  - No malignant cells identified  - Granulomatous inflammation D. LUNG, RLL, FINE NEEDLE ASPIRATION:  - No malignant cells identified  E. 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 Interval enlargement of the superior segment left lower lobe lesion with stable hypermetabolism. 2. Stable 9 mm medial right lower lobe pulmonary nodule with slightly increased SUV max. 3. Persistent hypermetabolic right hilar adenopathy. 4. New 10 mm sub solid lesion in the left lower lobe with SUV max of 2.14. This is likely inflammatory. Attention on future studies is suggested. 5. Stable 14 mm linear density in the right lateral breast with low level FDG uptake. An indolent breast cancer is possible. 6. No findings for abdominal/pelvic metastatic disease or osseous metastatic disease. 7. Stable benign bilateral adrenal gland adenomas.   11/22/2022 Imaging   MRI Brain: No evidence of intracranial metastatic disease.     11/30/2022 -  Chemotherapy   Patient is on Treatment Plan : LUNG SMALL CELL Cisplatin (80) D1 + Etoposide (100) D1-3 q21d     12/13/2022 -  Radiation Therapy   Started RT twice dialy       Current Treatment:  Chemo RT with Cisplatin + Etoposide  INTERVAL HISTORY:  Virginia Powell is here today for follow up. She reports experiencing nausea and vomiting, which is managed with medication. She also describes a burning sensation in her throat, making swallowing difficult. She has been able to consume soft foods like broccoli and applesauce, and has been drinking Ensure slowly. Popsicles have been helpful, especially at night when her mouth becomes dry.  She had inflammation in her mouth a couple of weeks ago, which resolved with a regimen of salt, baking soda, and vytone.She has been experiencing changes in taste due to the chemotherapy.  The patient also reports a recent bout of bronchitis, for which she received a steroid shot and a three-day course of antibiotics. She has been managing her smoking habit, but has not completely quit.  She is  overall doing decently well.  Has lost some weight but reports good energy levels.   I have reviewed the past medical history, past surgical history, social history and family history with the patient and they are unchanged from previous note.  ALLERGIES:  is allergic to morphine and codeine.  MEDICATIONS:  Current Outpatient Medications  Medication Sig Dispense Refill   albuterol (PROVENTIL) (2.5 MG/3ML) 0.083% nebulizer solution SMARTSIG:1 Vial(s) Via Nebulizer Every 4-6 Hours PRN     albuterol (VENTOLIN HFA) 108 (90 Base) MCG/ACT inhaler SMARTSIG:2 Puff(s) By Mouth Every 4 Hours     atorvastatin (LIPITOR) 40 MG tablet Take 40 mg by mouth daily.     azithromycin (ZITHROMAX) 500 MG tablet Take 500 mg by mouth daily.     CISPLATIN IV Inject into the vein every 21 ( twenty-one) days.     ETOPOSIDE IV Inject into the vein every 21 ( twenty-one) days.     fexofenadine (ALLEGRA) 180 MG tablet Take 180 mg by mouth daily as needed for allergies or rhinitis.     fluticasone (FLONASE) 50 MCG/ACT nasal spray Place into both nostrils daily as needed.     furosemide (LASIX) 20 MG tablet Take 20 mg by mouth daily.     gabapentin (NEURONTIN) 300 MG capsule Take 600 mg by mouth at bedtime.     hydrochlorothiazide (HYDRODIURIL) 12.5 MG tablet Take 12.5 mg by mouth daily.     hydrOXYzine (ATARAX) 25  MG tablet Take 1 tablet by mouth at bedtime.     lidocaine-prilocaine (EMLA) cream Apply a quarter-sized amount to port a cath site and cover with plastic wrap 1 hour prior to infusion appointments 30 g 3   loratadine (CLARITIN) 10 MG tablet Take 10 mg by mouth daily.     olmesartan (BENICAR) 40 MG tablet Take 1 tablet (40 mg total) by mouth daily. 30 tablet 0   prochlorperazine (COMPAZINE) 10 MG tablet Take 1 tablet (10 mg total) by mouth every 6 (six) hours as needed. 60 tablet 2   rizatriptan (MAXALT-MLT) 10 MG disintegrating tablet Take 10 mg by mouth as needed for migraine. May repeat in 2 hours if needed      sucralfate (CARAFATE) 1 GM/10ML suspension Take 10 mLs (1 g total) by mouth 4 (four) times daily -  with meals and at bedtime. 420 mL 0   TRELEGY ELLIPTA 100-62.5-25 MCG/ACT AEPB Inhale 1 puff into the lungs daily.     No current facility-administered medications for this visit.   Facility-Administered Medications Ordered in Other Visits  Medication Dose Route Frequency Provider Last Rate Last Admin   0.9 %  sodium chloride infusion   Intravenous Continuous Cindie Crumbly, MD   Stopped at 01/10/23 1526     REVIEW OF SYSTEMS:   Constitutional: Denies fevers, chills or abnormal weight loss Eyes: Denies blurriness of vision Ears, nose, mouth, throat, and face: Denies mucositis or sore throat Respiratory: Denies cough, dyspnea or wheezes Cardiovascular: Denies palpitation, chest discomfort or lower extremity swelling Gastrointestinal:  Denies nausea, heartburn or change in bowel habits Skin: Denies abnormal skin rashes Lymphatics: Denies new lymphadenopathy or easy bruising Neurological:Denies numbness, tingling or new weaknesses Behavioral/Psych: Mood is stable, no new changes  All other systems were reviewed with the patient and are negative.   VITALS:  Blood pressure 110/73, pulse (!) 107, temperature 97.6 F (36.4 C), temperature source Oral, resp. rate 16, weight 108 lb 7.5 oz (49.2 kg), SpO2 96%.  Wt Readings from Last 3 Encounters:  01/10/23 108 lb 7.5 oz (49.2 kg)  12/20/22 114 lb 4.8 oz (51.8 kg)  11/30/22 116 lb 3.2 oz (52.7 kg)    Performance status (ECOG): 0 - Asymptomatic  PHYSICAL EXAM:   GENERAL:alert, no distress and comfortable SKIN: skin color, texture, turgor are normal, no rashes or significant lesions LUNGS: clear to auscultation and percussion with normal breathing effort HEART: regular rate & rhythm and no murmurs and no lower extremity edema ABDOMEN:abdomen soft, non-tender and normal bowel sounds Musculoskeletal:no cyanosis of digits and no  clubbing  NEURO: alert & oriented x 3 with fluent speech  LABORATORY DATA:  I have reviewed the data as listed    Component Value Date/Time   NA 132 (L) 01/10/2023 0759   K 4.0 01/10/2023 0759   CL 103 01/10/2023 0759   CO2 20 (L) 01/10/2023 0759   GLUCOSE 144 (H) 01/10/2023 0759   BUN 31 (H) 01/10/2023 0759   CREATININE 1.12 (H) 01/10/2023 0759   CALCIUM 9.0 01/10/2023 0759   PROT 6.8 01/10/2023 0759   ALBUMIN 3.3 (L) 01/10/2023 0759   AST 21 01/10/2023 0759   ALT 37 01/10/2023 0759   ALKPHOS 61 01/10/2023 0759   BILITOT 0.7 01/10/2023 0759   GFRNONAA 53 (L) 01/10/2023 0759     Lab Results  Component Value Date   WBC 13.3 (H) 01/10/2023   NEUTROABS 11.5 (H) 01/10/2023   HGB 9.1 (L) 01/10/2023   HCT 27.1 (L) 01/10/2023  MCV 98.5 01/10/2023   PLT 498 (H) 01/10/2023      Chemistry      Component Value Date/Time   NA 132 (L) 01/10/2023 0759   K 4.0 01/10/2023 0759   CL 103 01/10/2023 0759   CO2 20 (L) 01/10/2023 0759   BUN 31 (H) 01/10/2023 0759   CREATININE 1.12 (H) 01/10/2023 0759      Component Value Date/Time   CALCIUM 9.0 01/10/2023 0759   ALKPHOS 61 01/10/2023 0759   AST 21 01/10/2023 0759   ALT 37 01/10/2023 0759   BILITOT 0.7 01/10/2023 0759

## 2023-01-10 NOTE — Progress Notes (Signed)
Patient tolerated chemotherapy with no complaints voiced.  Side effects with management reviewed with understanding verbalized.  Port site clean and dry with no bruising or swelling noted at site.  Good blood return noted before and after administration of chemotherapy.  Band aid applied.  Patient left in satisfactory condition with VSS and no s/s of distress noted. All follow ups as scheduled.  Virginia Powell [employer]

## 2023-01-10 NOTE — Progress Notes (Signed)
Nutrition Follow-up:  Pt with stage IV small cell lung cancer. Patient received first cisplatin/etoposide 10/15 concurrent with radiation.   Met with patient in infusion. Patient says appetite has fallen off. Foods do not taste good. She endorses radiation esophagitis. Patient unable to tolerate solid foods at this time. She drinking 3 cups of coffee for fluids given water burns. She is also tolerating popsicles, pudding, applesauce, chicken noodle soup. Patient is drinking one, sometimes two Ensure. She has to take "tiny"sips of this so it does not burn going down. Patient denies nausea, vomiting, diarrhea, constipation.     Medications: reviewed   Labs: Na 132, glucose 144, BUN 31, Cr 1.12, albumin 3.3  Anthropometrics: Wt 108 lb 7.5 oz today decreased 5% in 3 weeks - this is severe for time frame  11/4 - 114 lb 4.8 oz  10/17 - 116 lb 3.2 oz   NUTRITION DIAGNOSIS: Food and nutrition related knowledge deficit      INTERVENTION:  Consider carafate for esophagitis - discussed with Dr. Pershing Cox high calorie high protein warm supplement ideas  Continue drinking Ensure - increase to 3/day as tolerated     MONITORING, EVALUATION, GOAL: wt trends, intake   NEXT VISIT: Monday December 16 during infusion

## 2023-01-11 ENCOUNTER — Inpatient Hospital Stay: Payer: Medicare HMO

## 2023-01-11 ENCOUNTER — Other Ambulatory Visit: Payer: Self-pay

## 2023-01-11 VITALS — BP 122/68 | HR 100 | Temp 97.6°F | Resp 18

## 2023-01-11 DIAGNOSIS — C7802 Secondary malignant neoplasm of left lung: Secondary | ICD-10-CM | POA: Diagnosis not present

## 2023-01-11 DIAGNOSIS — Z5111 Encounter for antineoplastic chemotherapy: Secondary | ICD-10-CM | POA: Diagnosis not present

## 2023-01-11 DIAGNOSIS — Z5189 Encounter for other specified aftercare: Secondary | ICD-10-CM | POA: Diagnosis not present

## 2023-01-11 DIAGNOSIS — F17210 Nicotine dependence, cigarettes, uncomplicated: Secondary | ICD-10-CM | POA: Diagnosis not present

## 2023-01-11 DIAGNOSIS — D3502 Benign neoplasm of left adrenal gland: Secondary | ICD-10-CM | POA: Diagnosis not present

## 2023-01-11 DIAGNOSIS — C7801 Secondary malignant neoplasm of right lung: Secondary | ICD-10-CM | POA: Diagnosis not present

## 2023-01-11 DIAGNOSIS — C3491 Malignant neoplasm of unspecified part of right bronchus or lung: Secondary | ICD-10-CM | POA: Diagnosis not present

## 2023-01-11 DIAGNOSIS — D3501 Benign neoplasm of right adrenal gland: Secondary | ICD-10-CM | POA: Diagnosis not present

## 2023-01-11 MED ORDER — HEPARIN SOD (PORK) LOCK FLUSH 100 UNIT/ML IV SOLN
500.0000 [IU] | Freq: Once | INTRAVENOUS | Status: AC | PRN
Start: 2023-01-11 — End: 2023-01-11
  Administered 2023-01-11: 500 [IU]

## 2023-01-11 MED ORDER — ETOPOSIDE CHEMO INJECTION 1 GM/50ML
100.0000 mg/m2 | Freq: Once | INTRAVENOUS | Status: AC
  Administered 2023-01-11: 152 mg via INTRAVENOUS
  Filled 2023-01-11: qty 7.6

## 2023-01-11 MED ORDER — SODIUM CHLORIDE 0.9 % IV SOLN: INTRAVENOUS | Status: DC

## 2023-01-11 MED ORDER — DEXAMETHASONE SODIUM PHOSPHATE 10 MG/ML IJ SOLN
10.0000 mg | Freq: Once | INTRAMUSCULAR | Status: AC
Start: 2023-01-11 — End: 2023-01-11
  Administered 2023-01-11: 10 mg via INTRAVENOUS
  Filled 2023-01-11: qty 1

## 2023-01-11 MED ORDER — SODIUM CHLORIDE 0.9 % IV SOLN: 10.0000 mg | Freq: Once | INTRAVENOUS | Status: DC

## 2023-01-11 MED ORDER — SODIUM CHLORIDE 0.9% FLUSH
10.0000 mL | INTRAVENOUS | Status: DC | PRN
  Administered 2023-01-11: 10 mL

## 2023-01-11 NOTE — Progress Notes (Signed)
Patient presents today for VP16 infusion per providers order.  Vital signs within parameters for treatment.  Patient has no new complaints at this time.  Treatment given today per MD orders.  Stable during infusion without adverse affects.  Vital signs stable.  No complaints at this time.  Discharge from clinic ambulatory in stable condition.  Alert and oriented X 3.  Follow up with [facility] as scheduled.

## 2023-01-11 NOTE — Patient Instructions (Signed)
[facility]  Discharge Instructions: Thank you for choosing [facility] to provide your oncology and hematology care.  If you have a lab appointment with the Cancer Center - please note that after April 8th, 2024, all labs will be drawn in the cancer center.  You do not have to check in or register with the main entrance as you have in the past but will complete your check-in in the cancer center.  Wear comfortable clothing and clothing appropriate for easy access to any Portacath or PICC line.   We strive to give you quality time with your provider. You may need to reschedule your appointment if you arrive late (15 or more minutes).  Arriving late affects you and other patients whose appointments are after yours.  Also, if you miss three or more appointments without notifying the office, you may be dismissed from the clinic at the provider's discretion.      For prescription refill requests, have your pharmacy contact our office and allow 72 hours for refills to be completed.    Today you received the following chemotherapy and/or immunotherapy agents VP16      To help prevent nausea and vomiting after your treatment, we encourage you to take your nausea medication as directed.  BELOW ARE SYMPTOMS THAT SHOULD BE REPORTED IMMEDIATELY: *FEVER GREATER THAN 100.4 F (38 C) OR HIGHER *CHILLS OR SWEATING *NAUSEA AND VOMITING THAT IS NOT CONTROLLED WITH YOUR NAUSEA MEDICATION *UNUSUAL SHORTNESS OF BREATH *UNUSUAL BRUISING OR BLEEDING *URINARY PROBLEMS (pain or burning when urinating, or frequent urination) *BOWEL PROBLEMS (unusual diarrhea, constipation, pain near the anus) TENDERNESS IN MOUTH AND THROAT WITH OR WITHOUT PRESENCE OF ULCERS (sore throat, sores in mouth, or a toothache) UNUSUAL RASH, SWELLING OR PAIN  UNUSUAL VAGINAL DISCHARGE OR ITCHING   Items with * indicate a potential emergency and should be followed up as  soon as possible or go to the Emergency Department if any problems should occur.  Please show the CHEMOTHERAPY ALERT CARD or IMMUNOTHERAPY ALERT CARD at check-in to the Emergency Department and triage nurse.  Should you have questions after your visit or need to cancel or reschedule your appointment, please contact [facility] - A DEPT OF [facility] [phone]  and follow the prompts.  Office hours are 8:00 a.m. to 4:30 p.m. Monday - Friday. Please note that voicemails left after 4:00 p.m. may not be returned until the following business day.  We are closed weekends and major holidays. You have access to a nurse at all times for urgent questions. Please call the main number to the clinic [phone] and follow the prompts.  For any non-urgent questions, you may also contact your provider using MyChart. We now offer e-Visits for anyone 7 and older to request care online for non-urgent symptoms. For details visit [email or web address].   Also download the MyChart app! Go to the app store, search "MyChart", open the app, select [place], and log in with your MyChart username and password.

## 2023-01-12 ENCOUNTER — Inpatient Hospital Stay: Payer: Medicare HMO

## 2023-01-12 VITALS — BP 132/74 | HR 94 | Temp 97.7°F | Resp 20

## 2023-01-12 DIAGNOSIS — C3491 Malignant neoplasm of unspecified part of right bronchus or lung: Secondary | ICD-10-CM | POA: Diagnosis not present

## 2023-01-12 DIAGNOSIS — D3501 Benign neoplasm of right adrenal gland: Secondary | ICD-10-CM | POA: Diagnosis not present

## 2023-01-12 DIAGNOSIS — C7801 Secondary malignant neoplasm of right lung: Secondary | ICD-10-CM

## 2023-01-12 DIAGNOSIS — C7802 Secondary malignant neoplasm of left lung: Secondary | ICD-10-CM | POA: Diagnosis not present

## 2023-01-12 DIAGNOSIS — D3502 Benign neoplasm of left adrenal gland: Secondary | ICD-10-CM | POA: Diagnosis not present

## 2023-01-12 DIAGNOSIS — Z5111 Encounter for antineoplastic chemotherapy: Secondary | ICD-10-CM | POA: Diagnosis not present

## 2023-01-12 DIAGNOSIS — F17210 Nicotine dependence, cigarettes, uncomplicated: Secondary | ICD-10-CM | POA: Diagnosis not present

## 2023-01-12 DIAGNOSIS — Z5189 Encounter for other specified aftercare: Secondary | ICD-10-CM | POA: Diagnosis not present

## 2023-01-12 MED ORDER — DEXAMETHASONE SODIUM PHOSPHATE 10 MG/ML IJ SOLN
10.0000 mg | Freq: Once | INTRAMUSCULAR | Status: AC
  Administered 2023-01-12: 10 mg via INTRAVENOUS
  Filled 2023-01-12: qty 1

## 2023-01-12 MED ORDER — SODIUM CHLORIDE 0.9 % IV SOLN: 10.0000 mg | Freq: Once | INTRAVENOUS | Status: DC

## 2023-01-12 MED ORDER — ETOPOSIDE CHEMO INJECTION 1 GM/50ML
100.0000 mg/m2 | Freq: Once | INTRAVENOUS | Status: AC
  Administered 2023-01-12: 152 mg via INTRAVENOUS
  Filled 2023-01-12: qty 7.6

## 2023-01-12 MED ORDER — PEGFILGRASTIM 6 MG/0.6ML ~~LOC~~ PSKT
6.0000 mg | PREFILLED_SYRINGE | Freq: Once | SUBCUTANEOUS | Status: AC
  Administered 2023-01-12: 6 mg via SUBCUTANEOUS
  Filled 2023-01-12: qty 0.6

## 2023-01-12 MED ORDER — HEPARIN SOD (PORK) LOCK FLUSH 100 UNIT/ML IV SOLN
500.0000 [IU] | Freq: Once | INTRAVENOUS | Status: AC | PRN
Start: 2023-01-12 — End: 2023-01-12
  Administered 2023-01-12: 500 [IU]

## 2023-01-12 MED ORDER — SODIUM CHLORIDE 0.9% FLUSH
10.0000 mL | INTRAVENOUS | Status: DC | PRN
  Administered 2023-01-12: 10 mL

## 2023-01-12 MED ORDER — SODIUM CHLORIDE 0.9 % IV SOLN: INTRAVENOUS | Status: DC

## 2023-01-12 NOTE — Patient Instructions (Signed)
[facility]  Discharge Instructions: Thank you for choosing [facility] to provide your oncology and hematology care.  If you have a lab appointment with the Cancer Center - please note that after April 8th, 2024, all labs will be drawn in the cancer center.  You do not have to check in or register with the main entrance as you have in the past but will complete your check-in in the cancer center.  Wear comfortable clothing and clothing appropriate for easy access to any Portacath or PICC line.   We strive to give you quality time with your provider. You may need to reschedule your appointment if you arrive late (15 or more minutes).  Arriving late affects you and other patients whose appointments are after yours.  Also, if you miss three or more appointments without notifying the office, you may be dismissed from the clinic at the provider's discretion.      For prescription refill requests, have your pharmacy contact our office and allow 72 hours for refills to be completed.    Today you received the following chemotherapy and/or immunotherapy agents VP16 Onpro      To help prevent nausea and vomiting after your treatment, we encourage you to take your nausea medication as directed.  BELOW ARE SYMPTOMS THAT SHOULD BE REPORTED IMMEDIATELY: *FEVER GREATER THAN 100.4 F (38 C) OR HIGHER *CHILLS OR SWEATING *NAUSEA AND VOMITING THAT IS NOT CONTROLLED WITH YOUR NAUSEA MEDICATION *UNUSUAL SHORTNESS OF BREATH *UNUSUAL BRUISING OR BLEEDING *URINARY PROBLEMS (pain or burning when urinating, or frequent urination) *BOWEL PROBLEMS (unusual diarrhea, constipation, pain near the anus) TENDERNESS IN MOUTH AND THROAT WITH OR WITHOUT PRESENCE OF ULCERS (sore throat, sores in mouth, or a toothache) UNUSUAL RASH, SWELLING OR PAIN  UNUSUAL VAGINAL DISCHARGE OR ITCHING   Items with * indicate a potential emergency and should be followed  up as soon as possible or go to the Emergency Department if any problems should occur.  Please show the CHEMOTHERAPY ALERT CARD or IMMUNOTHERAPY ALERT CARD at check-in to the Emergency Department and triage nurse.  Should you have questions after your visit or need to cancel or reschedule your appointment, please contact [facility] - A DEPT OF [facility] [phone]  and follow the prompts.  Office hours are 8:00 a.m. to 4:30 p.m. Monday - Friday. Please note that voicemails left after 4:00 p.m. may not be returned until the following business day.  We are closed weekends and major holidays. You have access to a nurse at all times for urgent questions. Please call the main number to the clinic [phone] and follow the prompts.  For any non-urgent questions, you may also contact your provider using MyChart. We now offer e-Visits for anyone 20 and older to request care online for non-urgent symptoms. For details visit [email or web address].   Also download the MyChart app! Go to the app store, search "MyChart", open the app, select New Site, and log in with your MyChart username and password.

## 2023-01-12 NOTE — Progress Notes (Signed)
Patient presents today for VP16 infusion with Onpro placement per providers order.  Vital signs within parameters for treatment.  Patient has no new complaints at this time.    Treatment given today per MD orders.  Tolerated infusion without adverse affects.  Vital signs stable.  Neulasta Onpro placed on the right arm.  Verified green light flashing.  No complaints at this time.  Discharge from clinic ambulatory in stable condition.  Alert and oriented X 3.  Follow up with [facility] as scheduled.

## 2023-01-17 ENCOUNTER — Ambulatory Visit: Payer: Medicare HMO

## 2023-01-17 ENCOUNTER — Other Ambulatory Visit: Payer: Medicare HMO

## 2023-01-18 ENCOUNTER — Inpatient Hospital Stay: Payer: Medicare HMO

## 2023-01-19 ENCOUNTER — Inpatient Hospital Stay: Payer: Medicare HMO

## 2023-01-21 ENCOUNTER — Ambulatory Visit: Payer: Medicare HMO

## 2023-01-28 DIAGNOSIS — C3480 Malignant neoplasm of overlapping sites of unspecified bronchus and lung: Secondary | ICD-10-CM | POA: Diagnosis not present

## 2023-01-28 DIAGNOSIS — C771 Secondary and unspecified malignant neoplasm of intrathoracic lymph nodes: Secondary | ICD-10-CM | POA: Diagnosis not present

## 2023-01-31 ENCOUNTER — Inpatient Hospital Stay: Payer: Medicare HMO

## 2023-01-31 ENCOUNTER — Other Ambulatory Visit (HOSPITAL_COMMUNITY): Payer: Self-pay | Admitting: Hematology

## 2023-01-31 ENCOUNTER — Inpatient Hospital Stay: Payer: Medicare HMO | Attending: Oncology | Admitting: Dietician

## 2023-01-31 VITALS — BP 133/73 | HR 91 | Temp 98.0°F | Resp 18 | Wt 108.0 lb

## 2023-01-31 DIAGNOSIS — C7802 Secondary malignant neoplasm of left lung: Secondary | ICD-10-CM | POA: Diagnosis not present

## 2023-01-31 DIAGNOSIS — Z5189 Encounter for other specified aftercare: Secondary | ICD-10-CM | POA: Insufficient documentation

## 2023-01-31 DIAGNOSIS — C7801 Secondary malignant neoplasm of right lung: Secondary | ICD-10-CM

## 2023-01-31 DIAGNOSIS — F17210 Nicotine dependence, cigarettes, uncomplicated: Secondary | ICD-10-CM | POA: Insufficient documentation

## 2023-01-31 DIAGNOSIS — Z5111 Encounter for antineoplastic chemotherapy: Secondary | ICD-10-CM | POA: Insufficient documentation

## 2023-01-31 LAB — COMPREHENSIVE METABOLIC PANEL
ALT: 17 U/L (ref 0–44)
AST: 16 U/L (ref 15–41)
Albumin: 3.3 g/dL — ABNORMAL LOW (ref 3.5–5.0)
Alkaline Phosphatase: 64 U/L (ref 38–126)
Anion gap: 11 (ref 5–15)
BUN: 14 mg/dL (ref 8–23)
CO2: 20 mmol/L — ABNORMAL LOW (ref 22–32)
Calcium: 8.8 mg/dL — ABNORMAL LOW (ref 8.9–10.3)
Chloride: 104 mmol/L (ref 98–111)
Creatinine, Ser: 0.82 mg/dL (ref 0.44–1.00)
GFR, Estimated: >60 mL/min (ref >60)
Glucose, Bld: 120 mg/dL — ABNORMAL HIGH (ref 70–99)
Potassium: 3.9 mmol/L (ref 3.5–5.1)
Sodium: 135 mmol/L (ref 135–145)
Total Bilirubin: 0.3 mg/dL (ref <1.2)
Total Protein: 6.4 g/dL — ABNORMAL LOW (ref 6.5–8.1)

## 2023-01-31 LAB — CBC WITH DIFFERENTIAL/PLATELET
Abs Immature Granulocytes: 0.09 10*3/uL — ABNORMAL HIGH (ref 0.00–0.07)
Basophils Absolute: 0 10*3/uL (ref 0.0–0.1)
Basophils Relative: 0 %
Eosinophils Absolute: 0 10*3/uL (ref 0.0–0.5)
Eosinophils Relative: 0 %
HCT: 24.7 % — ABNORMAL LOW (ref 36.0–46.0)
Hemoglobin: 7.7 g/dL — ABNORMAL LOW (ref 12.0–15.0)
Immature Granulocytes: 1 %
Lymphocytes Relative: 8 %
Lymphs Abs: 0.8 10*3/uL (ref 0.7–4.0)
MCH: 32.1 pg (ref 26.0–34.0)
MCHC: 31.2 g/dL (ref 30.0–36.0)
MCV: 102.9 fL — ABNORMAL HIGH (ref 80.0–100.0)
Monocytes Absolute: 0.8 10*3/uL (ref 0.1–1.0)
Monocytes Relative: 9 %
Neutro Abs: 7.5 10*3/uL (ref 1.7–7.7)
Neutrophils Relative %: 82 %
Platelets: 371 10*3/uL (ref 150–400)
RBC: 2.4 MIL/uL — ABNORMAL LOW (ref 3.87–5.11)
RDW: 18.6 % — ABNORMAL HIGH (ref 11.5–15.5)
WBC: 9.2 10*3/uL (ref 4.0–10.5)
nRBC: 0 % (ref 0.0–0.2)

## 2023-01-31 LAB — PREPARE RBC (CROSSMATCH)

## 2023-01-31 LAB — MAGNESIUM: Magnesium: 1.9 mg/dL (ref 1.7–2.4)

## 2023-01-31 LAB — ABO/RH: ABO/RH(D): A NEG

## 2023-01-31 MED ORDER — DEXAMETHASONE SODIUM PHOSPHATE 10 MG/ML IJ SOLN
10.0000 mg | Freq: Once | INTRAMUSCULAR | Status: AC
  Administered 2023-01-31: 10 mg via INTRAVENOUS
  Filled 2023-01-31: qty 1

## 2023-01-31 MED ORDER — SODIUM CHLORIDE 0.9 % IV SOLN
59.0000 mg/m2 | Freq: Once | INTRAVENOUS | Status: AC
  Administered 2023-01-31: 90 mg via INTRAVENOUS
  Filled 2023-01-31: qty 90

## 2023-01-31 MED ORDER — SODIUM CHLORIDE 0.9 % IV SOLN
150.0000 mg | Freq: Once | INTRAVENOUS | Status: AC
  Administered 2023-01-31: 150 mg via INTRAVENOUS
  Filled 2023-01-31: qty 150

## 2023-01-31 MED ORDER — SODIUM CHLORIDE 0.9% FLUSH
10.0000 mL | INTRAVENOUS | Status: DC | PRN
Start: 2023-01-31 — End: 2023-01-31
  Administered 2023-01-31: 10 mL

## 2023-01-31 MED ORDER — MAGNESIUM SULFATE 2 GM/50ML IV SOLN
2.0000 g | Freq: Once | INTRAVENOUS | Status: AC
Start: 2023-01-31 — End: 2023-01-31
  Administered 2023-01-31: 2 g via INTRAVENOUS
  Filled 2023-01-31: qty 50

## 2023-01-31 MED ORDER — SODIUM CHLORIDE 0.9 % IV SOLN
100.0000 mg/m2 | Freq: Once | INTRAVENOUS | Status: AC
  Administered 2023-01-31: 152 mg via INTRAVENOUS
  Filled 2023-01-31: qty 7.6

## 2023-01-31 MED ORDER — SODIUM CHLORIDE 0.9 % IV SOLN
INTRAVENOUS | Status: DC
Start: 2023-01-31 — End: 2023-01-31

## 2023-01-31 MED ORDER — PALONOSETRON HCL INJECTION 0.25 MG/5ML
0.2500 mg | Freq: Once | INTRAVENOUS | Status: AC
  Administered 2023-01-31: 0.25 mg via INTRAVENOUS
  Filled 2023-01-31: qty 5

## 2023-01-31 MED ORDER — SODIUM CHLORIDE 0.9 % IV SOLN: Freq: Once | INTRAVENOUS | Status: AC

## 2023-01-31 MED ORDER — HEPARIN SOD (PORK) LOCK FLUSH 100 UNIT/ML IV SOLN
500.0000 [IU] | Freq: Once | INTRAVENOUS | Status: AC | PRN
  Administered 2023-01-31: 500 [IU]

## 2023-01-31 MED ORDER — POTASSIUM CHLORIDE IN NACL 20-0.9 MEQ/L-% IV SOLN
Freq: Once | INTRAVENOUS | Status: AC
  Filled 2023-01-31: qty 1000

## 2023-01-31 NOTE — Progress Notes (Signed)
Nutrition Follow-up:  Pt with stage IV small cell lung cancer. Patient received first cisplatin/etoposide 10/15 concurrent with radiation. Final RT 11/25 under the care of Dr. Langston Masker [facility])  Met with patient in infusion. Patient reports taste is improving. She is no longer able to eat cheese as this taste rotten. She has eaten asparagus the last few mornings because it taste good. Patient reports esophagitis has resolved. Carafate was helpful during this time. Patient reports episode of vomiting with Ensure and unable to drink this any longer. Patient says she has been eating ice cream instead. Patient denies nausea, vomiting, diarrhea, constipation.    Medications: carafate  Labs: glucose 120, albumin 3.3  Anthropometrics: Wt 108 lb today - stable x 2 weeks  11/25 - 108 lb 7.5 oz  11/4 - 114 lb 4.8 oz  10/17 - 116 lb 3.2 oz   NUTRITION DIAGNOSIS: Food and nutrition related knowledge deficit     INTERVENTION:  Encouraged high calorie high protein foods to promote weight gain    MONITORING, EVALUATION, GOAL: wt trends, intake   NEXT VISIT: To be scheduled as needed

## 2023-01-31 NOTE — Patient Instructions (Signed)
[facility] [place] - [facility]  Discharge Instructions: Thank you for choosing [facility] to provide your oncology and hematology care.  If you have a lab appointment with the Cancer Center - please note that after April 8th, 2024, all labs will be drawn in the cancer center.  You do not have to check in or register with the main entrance as you have in the past but will complete your check-in in the cancer center.  Wear comfortable clothing and clothing appropriate for easy access to any Portacath or PICC line.   We strive to give you quality time with your provider. You may need to reschedule your appointment if you arrive late (15 or more minutes).  Arriving late affects you and other patients whose appointments are after yours.  Also, if you miss three or more appointments without notifying the office, you may be dismissed from the clinic at the provider's discretion.      For prescription refill requests, have your pharmacy contact our office and allow 72 hours for refills to be completed.    Today you received the following chemotherapy and/or immunotherapy agents Cisplatin/Etoposide   To help prevent nausea and vomiting after your treatment, we encourage you to take your nausea medication as directed.  Cisplatin Injection What is this medication? CISPLATIN (SIS pla tin) treats some types of cancer. It works by slowing down the growth of cancer cells. This medicine may be used for other purposes; ask your health care provider or pharmacist if you have questions. COMMON BRAND NAME(S): Platinol, Platinol -AQ What should I tell my care team before I take this medication? They need to know if you have any of these conditions: Eye disease, vision problems Hearing problems Kidney disease Low blood counts, such as low white cells, platelets, or red blood cells Tingling of the fingers or toes, or other nerve disorder An unusual or allergic  reaction to cisplatin, carboplatin, oxaliplatin, other medications, foods, dyes, or preservatives If you or your partner are pregnant or trying to get pregnant Breast-feeding How should I use this medication? This medication is injected into a vein. It is given by your care team in a hospital or clinic setting. Talk to your care team about the use of this medication in children. Special care may be needed. Overdosage: If you think you have taken too much of this medicine contact a poison control center or emergency room at once. NOTE: This medicine is only for you. Do not share this medicine with others. What if I miss a dose? Keep appointments for follow-up doses. It is important not to miss your dose. Call your care team if you are unable to keep an appointment. What may interact with this medication? Do not take this medication with any of the following: Live virus vaccines This medication may also interact with the following: Certain antibiotics, such as amikacin, gentamicin, neomycin, polymyxin B, streptomycin, tobramycin, vancomycin Foscarnet This list may not describe all possible interactions. Give your health care provider a list of all the medicines, herbs, non-prescription drugs, or dietary supplements you use. Also tell them if you smoke, drink alcohol, or use illegal drugs. Some items may interact with your medicine. What should I watch for while using this medication? Your condition will be monitored carefully while you are receiving this medication. You may need blood work done while taking this medication. This medication may make you feel generally unwell. This is not  uncommon, as chemotherapy can affect healthy cells as well as cancer cells. Report any side effects. Continue your course of treatment even though you feel ill unless your care team tells you to stop. This medication may increase your risk of getting an infection. Call your care team for advice if you get a fever,  chills, sore throat, or other symptoms of a cold or flu. Do not treat yourself. Try to avoid being around people who are sick. Avoid taking medications that contain aspirin, acetaminophen, ibuprofen, naproxen, or ketoprofen unless instructed by your care team. These medications may hide a fever. This medication may increase your risk to bruise or bleed. Call your care team if you notice any unusual bleeding. Be careful brushing or flossing your teeth or using a toothpick because you may get an infection or bleed more easily. If you have any dental work done, tell your dentist you are receiving this medication. Drink fluids as directed while you are taking this medication. This will help protect your kidneys. Call your care team if you get diarrhea. Do not treat yourself. Talk to your care team if you or your partner wish to become pregnant or think you might be pregnant. This medication can cause serious birth defects if taken during pregnancy and for 14 months after the last dose. A negative pregnancy test is required before starting this medication. A reliable form of contraception is recommended while taking this medication and for 14 months after the last dose. Talk to your care team about effective forms of contraception. Do not father a child while taking this medication and for 11 months after the last dose. Use a condom during sex during this time period. Do not breast-feed while taking this medication. This medication may cause infertility. Talk to your care team if you are concerned about your fertility. What side effects may I notice from receiving this medication? Side effects that you should report to your care team as soon as possible: Allergic reactions--skin rash, itching, hives, swelling of the face, lips, tongue, or throat Eye pain, change in vision, vision loss Hearing loss, ringing in ears Infection--fever, chills, cough, sore throat, wounds that don't heal, pain or trouble when  passing urine, general feeling of discomfort or being unwell Kidney injury--decrease in the amount of urine, swelling of the ankles, hands, or feet Low red blood cell level--unusual weakness or fatigue, dizziness, headache, trouble breathing Painful swelling, warmth, or redness of the skin, blisters or sores at the infusion site Pain, tingling, or numbness in the hands or feet Unusual bruising or bleeding Side effects that usually do not require medical attention (report to your care team if they continue or are bothersome): Hair loss Nausea Vomiting This list may not describe all possible side effects. Call your doctor for medical advice about side effects. You may report side effects to FDA at 1-800-FDA-1088. Where should I keep my medication? This medication is given in a hospital or clinic. It will not be stored at home. NOTE: This sheet is a summary. It may not cover all possible information. If you have questions about this medicine, talk to your doctor, pharmacist, or health care provider.  2024 Elsevier/Gold Standard (2021-06-05 00:00:00)  Etoposide Injection What is this medication? ETOPOSIDE (e toe POE side) treats some types of cancer. It works by slowing down the growth of cancer cells. This medicine may be used for other purposes; ask your health care provider or pharmacist if you have questions. COMMON BRAND NAME(S): Etopophos, Toposar,  VePesid What should I tell my care team before I take this medication? They need to know if you have any of these conditions: Infection Kidney disease Liver disease Low blood counts, such as low white cell, platelet, red cell counts An unusual or allergic reaction to etoposide, other medications, foods, dyes, or preservatives If you or your partner are pregnant or trying to get pregnant Breastfeeding How should I use this medication? This medication is injected into a vein. It is given by your care team in a hospital or clinic  setting. Talk to your care team about the use of this medication in children. Special care may be needed. Overdosage: If you think you have taken too much of this medicine contact a poison control center or emergency room at once. NOTE: This medicine is only for you. Do not share this medicine with others. What if I miss a dose? Keep appointments for follow-up doses. It is important not to miss your dose. Call your care team if you are unable to keep an appointment. What may interact with this medication? Warfarin This list may not describe all possible interactions. Give your health care provider a list of all the medicines, herbs, non-prescription drugs, or dietary supplements you use. Also tell them if you smoke, drink alcohol, or use illegal drugs. Some items may interact with your medicine. What should I watch for while using this medication? Your condition will be monitored carefully while you are receiving this medication. This medication may make you feel generally unwell. This is not uncommon as chemotherapy can affect healthy cells as well as cancer cells. Report any side effects. Continue your course of treatment even though you feel ill unless your care team tells you to stop. This medication can cause serious side effects. To reduce the risk, your care team may give you other medications to take before receiving this one. Be sure to follow the directions from your care team. This medication may increase your risk of getting an infection. Call your care team for advice if you get a fever, chills, sore throat, or other symptoms of a cold or flu. Do not treat yourself. Try to avoid being around people who are sick. This medication may increase your risk to bruise or bleed. Call your care team if you notice any unusual bleeding. Talk to your care team about your risk of cancer. You may be more at risk for certain types of cancers if you take this medication. Talk to your care team if you may  be pregnant. Serious birth defects can occur if you take this medication during pregnancy and for 6 months after the last dose. You will need a negative pregnancy test before starting this medication. Contraception is recommended while taking this medication and for 6 months after the last dose. Your care team can help you find the option that works for you. If your partner can get pregnant, use a condom during sex while taking this medication and for 4 months after the last dose. Do not breastfeed while taking this medication. This medication may cause infertility. Talk to your care team if you are concerned about your fertility. What side effects may I notice from receiving this medication? Side effects that you should report to your care team as soon as possible: Allergic reactions--skin rash, itching, hives, swelling of the face, lips, tongue, or throat Infection--fever, chills, cough, sore throat, wounds that don't heal, pain or trouble when passing urine, general feeling of discomfort or being unwell  Low red blood cell level--unusual weakness or fatigue, dizziness, headache, trouble breathing Unusual bruising or bleeding Side effects that usually do not require medical attention (report to your care team if they continue or are bothersome): Diarrhea Fatigue Hair loss Loss of appetite Nausea Vomiting This list may not describe all possible side effects. Call your doctor for medical advice about side effects. You may report side effects to FDA at 1-800-FDA-1088. Where should I keep my medication? This medication is given in a hospital or clinic. It will not be stored at home. NOTE: This sheet is a summary. It may not cover all possible information. If you have questions about this medicine, talk to your doctor, pharmacist, or health care provider.  2024 Elsevier/Gold Standard (2021-06-25 00:00:00)    BELOW ARE SYMPTOMS THAT SHOULD BE REPORTED IMMEDIATELY: *FEVER GREATER THAN 100.4 F  (38 C) OR HIGHER *CHILLS OR SWEATING *NAUSEA AND VOMITING THAT IS NOT CONTROLLED WITH YOUR NAUSEA MEDICATION *UNUSUAL SHORTNESS OF BREATH *UNUSUAL BRUISING OR BLEEDING *URINARY PROBLEMS (pain or burning when urinating, or frequent urination) *BOWEL PROBLEMS (unusual diarrhea, constipation, pain near the anus) TENDERNESS IN MOUTH AND THROAT WITH OR WITHOUT PRESENCE OF ULCERS (sore throat, sores in mouth, or a toothache) UNUSUAL RASH, SWELLING OR PAIN  UNUSUAL VAGINAL DISCHARGE OR ITCHING   Items with * indicate a potential emergency and should be followed up as soon as possible or go to the Emergency Department if any problems should occur.  Please show the CHEMOTHERAPY ALERT CARD or IMMUNOTHERAPY ALERT CARD at check-in to the Emergency Department and triage nurse.  Should you have questions after your visit or need to cancel or reschedule your appointment, please contact [facility] [place] - A DEPT OF Eligh[facility] Bridegroom [facility] [phone]  and follow the prompts.  Office hours are 8:00 a.m. to 4:30 p.m. Monday - Friday. Please note that voicemails left after 4:00 p.m. may not be returned until the following business day.  We are closed weekends and major holidays. You have access to a nurse at all times for urgent questions. Please call the main number to the clinic [phone] and follow the prompts.  For any non-urgent questions, you may also contact your provider using MyChart. We now offer e-Visits for anyone 30 and older to request care online for non-urgent symptoms. For details visit [email or web address].   Also download the MyChart app! Go to the app store, search "MyChart", open the app, select [place], and log in with your MyChart username and password.

## 2023-01-31 NOTE — Progress Notes (Signed)
Patient presents today for Cisplatin/Etoposide infusion.  Patient is in satisfactory condition with no new complaints voiced.  Vital signs are stable. Labs reviewed and all other labs are within treatment parameters. Patient's hemoglobin noted to be 7.7 today. Patient will receive 1 unit of blood tomorrow per Dr.K. We will proceed with treatment per MD orders.    Patient urinated 200 mL prior to Cisplatin administration.   Treatment given today per MD orders. Tolerated infusion without adverse affects. Vital signs stable. No complaints at this time. Discharged from clinic ambulatory in stable condition. Alert and oriented x 3. F/U with [facility] as scheduled.

## 2023-02-01 ENCOUNTER — Inpatient Hospital Stay: Payer: Medicare HMO

## 2023-02-01 VITALS — BP 144/82 | HR 92 | Temp 97.8°F | Resp 18

## 2023-02-01 DIAGNOSIS — Z5111 Encounter for antineoplastic chemotherapy: Secondary | ICD-10-CM | POA: Diagnosis not present

## 2023-02-01 DIAGNOSIS — C7801 Secondary malignant neoplasm of right lung: Secondary | ICD-10-CM

## 2023-02-01 MED ORDER — HEPARIN SOD (PORK) LOCK FLUSH 100 UNIT/ML IV SOLN
500.0000 [IU] | Freq: Once | INTRAVENOUS | Status: AC | PRN
  Administered 2023-02-01: 500 [IU]

## 2023-02-01 MED ORDER — SODIUM CHLORIDE 0.9% IV SOLUTION
250.0000 mL | INTRAVENOUS | Status: DC
  Administered 2023-02-01: 250 mL via INTRAVENOUS

## 2023-02-01 MED ORDER — DEXAMETHASONE SODIUM PHOSPHATE 10 MG/ML IJ SOLN
10.0000 mg | Freq: Once | INTRAMUSCULAR | Status: AC
  Administered 2023-02-01: 10 mg via INTRAVENOUS
  Filled 2023-02-01: qty 1

## 2023-02-01 MED ORDER — SODIUM CHLORIDE 0.9 % IV SOLN: INTRAVENOUS | Status: DC

## 2023-02-01 MED ORDER — DIPHENHYDRAMINE HCL 25 MG PO CAPS
25.0000 mg | ORAL_CAPSULE | Freq: Once | ORAL | Status: AC
Start: 2023-02-01 — End: 2023-02-01
  Administered 2023-02-01: 25 mg via ORAL
  Filled 2023-02-01: qty 1

## 2023-02-01 MED ORDER — ETOPOSIDE CHEMO INJECTION 1 GM/50ML
100.0000 mg/m2 | Freq: Once | INTRAVENOUS | Status: AC
  Administered 2023-02-01: 152 mg via INTRAVENOUS
  Filled 2023-02-01: qty 7.6

## 2023-02-01 MED ORDER — ACETAMINOPHEN 325 MG PO TABS
650.0000 mg | ORAL_TABLET | Freq: Once | ORAL | Status: DC
  Filled 2023-02-01: qty 2

## 2023-02-01 NOTE — Progress Notes (Signed)
Pt tolerated blood transfusion well with no complications. Vitals stable throughout and post transfusion.   Patient tolerated chemotherapy with no complaints voiced.  Side effects with management reviewed with understanding verbalized.  Port site clean and dry with no bruising or swelling noted at site.  Good blood return noted before and after administration of chemotherapy.  Band aid applied.  Patient left in satisfactory condition with VSS and no s/s of distress noted. All follow ups as scheduled.   Raveen Wieseler [employer]

## 2023-02-01 NOTE — Patient Instructions (Signed)
[facility] [place] - [facility]  Discharge Instructions: Thank you for choosing [facility] to provide your oncology and hematology care.  If you have a lab appointment with the Cancer Center - please note that after April 8th, 2024, all labs will be drawn in the cancer center.  You do not have to check in or register with the main entrance as you have in the past but will complete your check-in in the cancer center.  Wear comfortable clothing and clothing appropriate for easy access to any Portacath or PICC line.   We strive to give you quality time with your provider. You may need to reschedule your appointment if you arrive late (15 or more minutes).  Arriving late affects you and other patients whose appointments are after yours.  Also, if you miss three or more appointments without notifying the office, you may be dismissed from the clinic at the provider's discretion.      For prescription refill requests, have your pharmacy contact our office and allow 72 hours for refills to be completed.    Today you received the following chemotherapy and/or immunotherapy agents etoposide      To help prevent nausea and vomiting after your treatment, we encourage you to take your nausea medication as directed.  BELOW ARE SYMPTOMS THAT SHOULD BE REPORTED IMMEDIATELY: *FEVER GREATER THAN 100.4 F (38 C) OR HIGHER *CHILLS OR SWEATING *NAUSEA AND VOMITING THAT IS NOT CONTROLLED WITH YOUR NAUSEA MEDICATION *UNUSUAL SHORTNESS OF BREATH *UNUSUAL BRUISING OR BLEEDING *URINARY PROBLEMS (pain or burning when urinating, or frequent urination) *BOWEL PROBLEMS (unusual diarrhea, constipation, pain near the anus) TENDERNESS IN MOUTH AND THROAT WITH OR WITHOUT PRESENCE OF ULCERS (sore throat, sores in mouth, or a toothache) UNUSUAL RASH, SWELLING OR PAIN  UNUSUAL VAGINAL DISCHARGE OR ITCHING   Items with * indicate a potential emergency and should be followed up  as soon as possible or go to the Emergency Department if any problems should occur.  Please show the CHEMOTHERAPY ALERT CARD or IMMUNOTHERAPY ALERT CARD at check-in to the Emergency Department and triage nurse.  Should you have questions after your visit or need to cancel or reschedule your appointment, please contact [facility] [place] - A DEPT OF [facility] Bridegroom [facility] [phone]  and follow the prompts.  Office hours are 8:00 a.m. to 4:30 p.m. Monday - Friday. Please note that voicemails left after 4:00 p.m. may not be returned until the following business day.  We are closed weekends and major holidays. You have access to a nurse at all times for urgent questions. Please call the main number to the clinic [phone] and follow the prompts.  For any non-urgent questions, you may also contact your provider using MyChart. We now offer e-Visits for anyone 3 and older to request care online for non-urgent symptoms. For details visit [email or web address].   Also download the MyChart app! Go to the app store, search "MyChart", open the app, select [place], and log in with your MyChart username and password.

## 2023-02-02 ENCOUNTER — Inpatient Hospital Stay: Payer: Medicare HMO

## 2023-02-02 VITALS — BP 136/90 | HR 80 | Temp 98.1°F | Resp 16

## 2023-02-02 DIAGNOSIS — Z5111 Encounter for antineoplastic chemotherapy: Secondary | ICD-10-CM | POA: Diagnosis not present

## 2023-02-02 DIAGNOSIS — C7802 Secondary malignant neoplasm of left lung: Secondary | ICD-10-CM

## 2023-02-02 DIAGNOSIS — C7801 Secondary malignant neoplasm of right lung: Secondary | ICD-10-CM

## 2023-02-02 LAB — TYPE AND SCREEN
ABO/RH(D): A NEG
Antibody Screen: NEGATIVE
Unit division: 0

## 2023-02-02 LAB — BPAM RBC
Blood Product Expiration Date: 202412202359
ISSUE DATE / TIME: 202412170832
Unit Type and Rh: 202412202359
Unit Type and Rh: 600

## 2023-02-02 MED ORDER — SODIUM CHLORIDE 0.9 % IV SOLN
INTRAVENOUS | Status: DC
Start: 2023-02-02 — End: 2023-02-02

## 2023-02-02 MED ORDER — SODIUM CHLORIDE 0.9 % IV SOLN
100.0000 mg/m2 | Freq: Once | INTRAVENOUS | Status: AC
  Administered 2023-02-02: 152 mg via INTRAVENOUS
  Filled 2023-02-02: qty 7.6

## 2023-02-02 MED ORDER — HEPARIN SOD (PORK) LOCK FLUSH 100 UNIT/ML IV SOLN
500.0000 [IU] | Freq: Once | INTRAVENOUS | Status: AC | PRN
Start: 2023-02-02 — End: 2023-02-02
  Administered 2023-02-02: 500 [IU]

## 2023-02-02 MED ORDER — DEXAMETHASONE SODIUM PHOSPHATE 10 MG/ML IJ SOLN
10.0000 mg | Freq: Once | INTRAMUSCULAR | Status: AC
  Administered 2023-02-02: 10 mg via INTRAVENOUS
  Filled 2023-02-02: qty 1

## 2023-02-02 NOTE — Progress Notes (Signed)
Patient tolerated chemotherapy with no complaints voiced.  Side effects with management reviewed with understanding verbalized.  Port site clean and dry with no bruising or swelling noted at site.  Good blood return noted before and after administration of chemotherapy.  Band aid applied.  Patient left in satisfactory condition with VSS and no s/s of distress noted. All follow ups as scheduled.   Virginia Powell [employer]

## 2023-02-02 NOTE — Patient Instructions (Signed)
[facility] [place] - [facility]  Discharge Instructions: Thank you for choosing [facility] to provide your oncology and hematology care.  If you have a lab appointment with the Cancer Center - please note that after April 8th, 2024, all labs will be drawn in the cancer center.  You do not have to check in or register with the main entrance as you have in the past but will complete your check-in in the cancer center.  Wear comfortable clothing and clothing appropriate for easy access to any Portacath or PICC line.   We strive to give you quality time with your provider. You may need to reschedule your appointment if you arrive late (15 or more minutes).  Arriving late affects you and other patients whose appointments are after yours.  Also, if you miss three or more appointments without notifying the office, you may be dismissed from the clinic at the provider's discretion.      For prescription refill requests, have your pharmacy contact our office and allow 72 hours for refills to be completed.    Today you received the following chemotherapy and/or immunotherapy agents etoposide      To help prevent nausea and vomiting after your treatment, we encourage you to take your nausea medication as directed.  BELOW ARE SYMPTOMS THAT SHOULD BE REPORTED IMMEDIATELY: *FEVER GREATER THAN 100.4 F (38 C) OR HIGHER *CHILLS OR SWEATING *NAUSEA AND VOMITING THAT IS NOT CONTROLLED WITH YOUR NAUSEA MEDICATION *UNUSUAL SHORTNESS OF BREATH *UNUSUAL BRUISING OR BLEEDING *URINARY PROBLEMS (pain or burning when urinating, or frequent urination) *BOWEL PROBLEMS (unusual diarrhea, constipation, pain near the anus) TENDERNESS IN MOUTH AND THROAT WITH OR WITHOUT PRESENCE OF ULCERS (sore throat, sores in mouth, or a toothache) UNUSUAL RASH, SWELLING OR PAIN  UNUSUAL VAGINAL DISCHARGE OR ITCHING   Items with * indicate a potential emergency and should be followed up  as soon as possible or go to the Emergency Department if any problems should occur.  Please show the CHEMOTHERAPY ALERT CARD or IMMUNOTHERAPY ALERT CARD at check-in to the Emergency Department and triage nurse.  Should you have questions after your visit or need to cancel or reschedule your appointment, please contact [facility] [place] - A DEPT OF [facility] Bridegroom [facility] [phone]  and follow the prompts.  Office hours are 8:00 a.m. to 4:30 p.m. Monday - Friday. Please note that voicemails left after 4:00 p.m. may not be returned until the following business day.  We are closed weekends and major holidays. You have access to a nurse at all times for urgent questions. Please call the main number to the clinic [phone] and follow the prompts.  For any non-urgent questions, you may also contact your provider using MyChart. We now offer e-Visits for anyone 3 and older to request care online for non-urgent symptoms. For details visit [email or web address].   Also download the MyChart app! Go to the app store, search "MyChart", open the app, select [place], and log in with your MyChart username and password.

## 2023-02-03 ENCOUNTER — Other Ambulatory Visit: Payer: Self-pay

## 2023-02-04 ENCOUNTER — Inpatient Hospital Stay: Payer: Medicare HMO

## 2023-02-04 VITALS — BP 133/89 | HR 100 | Resp 18

## 2023-02-04 DIAGNOSIS — C7802 Secondary malignant neoplasm of left lung: Secondary | ICD-10-CM

## 2023-02-04 DIAGNOSIS — Z5111 Encounter for antineoplastic chemotherapy: Secondary | ICD-10-CM | POA: Diagnosis not present

## 2023-02-04 MED ORDER — PEGFILGRASTIM-JMDB 6 MG/0.6ML ~~LOC~~ SOSY
6.0000 mg | PREFILLED_SYRINGE | Freq: Once | SUBCUTANEOUS | Status: AC
  Administered 2023-02-04: 6 mg via SUBCUTANEOUS
  Filled 2023-02-04: qty 0.6

## 2023-02-04 NOTE — Patient Instructions (Signed)
[facility] [place] [facility]  Discharge Instructions: Thank you for choosing [facility] to provide your oncology and hematology care.  If you have a lab appointment with the Cancer Center - please note that after April 8th, 2024, all labs will be drawn in the cancer center.  You do not have to check in or register with the main entrance as you have in the past but will complete your check-in in the cancer center.  Wear comfortable clothing and clothing appropriate for easy access to any Portacath or PICC line.   We strive to give you quality time with your provider. You may need to reschedule your appointment if you arrive late (15 or more minutes).  Arriving late affects you and other patients whose appointments are after yours.  Also, if you miss three or more appointments without notifying the office, you may be dismissed from the clinic at the provider's discretion.      For prescription refill requests, have your pharmacy contact our office and allow 72 hours for refills to be completed.    Today you received the following Fulphila injection, return as scheduled.   To help prevent nausea and vomiting after your treatment, we encourage you to take your nausea medication as directed.  BELOW ARE SYMPTOMS THAT SHOULD BE REPORTED IMMEDIATELY: *FEVER GREATER THAN 100.4 F (38 C) OR HIGHER *CHILLS OR SWEATING *NAUSEA AND VOMITING THAT IS NOT CONTROLLED WITH YOUR NAUSEA MEDICATION *UNUSUAL SHORTNESS OF BREATH *UNUSUAL BRUISING OR BLEEDING *URINARY PROBLEMS (pain or burning when urinating, or frequent urination) *BOWEL PROBLEMS (unusual diarrhea, constipation, pain near the anus) TENDERNESS IN MOUTH AND THROAT WITH OR WITHOUT PRESENCE OF ULCERS (sore throat, sores in mouth, or a toothache) UNUSUAL RASH, SWELLING OR PAIN  UNUSUAL VAGINAL DISCHARGE OR ITCHING   Items with * indicate a potential emergency and should be followed up as soon as  possible or go to the Emergency Department if any problems should occur.  Please show the CHEMOTHERAPY ALERT CARD or IMMUNOTHERAPY ALERT CARD at check-in to the Emergency Department and triage nurse.  Should you have questions after your visit or need to cancel or reschedule your appointment, please contact [facility] [place] - A DEPT OF [facility] Bridegroom [facility] [phone]  and follow the prompts.  Office hours are 8:00 a.m. to 4:30 p.m. Monday - Friday. Please note that voicemails left after 4:00 p.m. may not be returned until the following business day.  We are closed weekends and major holidays. You have access to a nurse at all times for urgent questions. Please call the main number to the clinic [phone] and follow the prompts.  For any non-urgent questions, you may also contact your provider using MyChart. We now offer e-Visits for anyone 79 and older to request care online for non-urgent symptoms. For details visit [email or web address].   Also download the MyChart app! Go to the app store, search "MyChart", open the app, select [place], and log in with your MyChart username and password.

## 2023-02-04 NOTE — Progress Notes (Signed)
Patient tolerated injection with no complaints voiced. Site clean and dry with no bruising or swelling noted at site. See MAR for details. Band aid applied.  Patient stable during and after injection. VSS with discharge and left in satisfactory condition with no s/s of distress noted.  

## 2023-02-17 ENCOUNTER — Ambulatory Visit (HOSPITAL_COMMUNITY)
Admission: RE | Admit: 2023-02-17 | Discharge: 2023-02-17 | Disposition: A | Discharge status: Home / Self Care | Payer: PPO | Source: Ambulatory Visit | Attending: Oncology | Admitting: Oncology

## 2023-02-17 DIAGNOSIS — C7802 Secondary malignant neoplasm of left lung: Secondary | ICD-10-CM | POA: Diagnosis not present

## 2023-02-17 DIAGNOSIS — C7801 Secondary malignant neoplasm of right lung: Secondary | ICD-10-CM

## 2023-02-17 MED ORDER — IOHEXOL 300 MG/ML  SOLN
100.0000 mL | Freq: Once | INTRAMUSCULAR | Status: AC | PRN
  Administered 2023-02-17: 100 mL via INTRAVENOUS

## 2023-02-21 ENCOUNTER — Encounter: Payer: Self-pay | Admitting: Oncology

## 2023-02-22 ENCOUNTER — Other Ambulatory Visit: Payer: Self-pay

## 2023-02-23 ENCOUNTER — Encounter: Payer: Self-pay | Admitting: Oncology

## 2023-02-25 ENCOUNTER — Inpatient Hospital Stay: Payer: PPO

## 2023-02-25 ENCOUNTER — Inpatient Hospital Stay: Payer: PPO | Admitting: Oncology

## 2023-03-08 ENCOUNTER — Encounter: Payer: Self-pay | Admitting: Oncology

## 2023-03-09 ENCOUNTER — Encounter: Payer: Self-pay | Admitting: Oncology

## 2023-03-10 ENCOUNTER — Encounter: Payer: Self-pay | Admitting: Oncology

## 2023-03-10 ENCOUNTER — Inpatient Hospital Stay: Payer: PPO | Attending: Oncology

## 2023-03-10 ENCOUNTER — Inpatient Hospital Stay (HOSPITAL_BASED_OUTPATIENT_CLINIC_OR_DEPARTMENT_OTHER): Payer: PPO | Admitting: Oncology

## 2023-03-10 VITALS — BP 124/88 | HR 99 | Temp 98.6°F | Resp 18 | Wt 106.4 lb

## 2023-03-10 DIAGNOSIS — D649 Anemia, unspecified: Secondary | ICD-10-CM | POA: Diagnosis not present

## 2023-03-10 DIAGNOSIS — Z923 Personal history of irradiation: Secondary | ICD-10-CM | POA: Diagnosis not present

## 2023-03-10 DIAGNOSIS — C7802 Secondary malignant neoplasm of left lung: Secondary | ICD-10-CM | POA: Diagnosis not present

## 2023-03-10 DIAGNOSIS — Z9221 Personal history of antineoplastic chemotherapy: Secondary | ICD-10-CM | POA: Diagnosis not present

## 2023-03-10 DIAGNOSIS — C7801 Secondary malignant neoplasm of right lung: Secondary | ICD-10-CM

## 2023-03-10 DIAGNOSIS — F17210 Nicotine dependence, cigarettes, uncomplicated: Secondary | ICD-10-CM

## 2023-03-10 DIAGNOSIS — D6489 Other specified anemias: Secondary | ICD-10-CM | POA: Diagnosis not present

## 2023-03-10 DIAGNOSIS — C801 Malignant (primary) neoplasm, unspecified: Secondary | ICD-10-CM | POA: Insufficient documentation

## 2023-03-10 LAB — CBC WITH DIFFERENTIAL/PLATELET
Abs Immature Granulocytes: 0.03 10*3/uL (ref 0.00–0.07)
Basophils Absolute: 0 10*3/uL (ref 0.0–0.1)
Basophils Relative: 0 %
Eosinophils Absolute: 0.1 10*3/uL (ref 0.0–0.5)
Eosinophils Relative: 1 %
HCT: 29.7 % — ABNORMAL LOW (ref 36.0–46.0)
Hemoglobin: 9.7 g/dL — ABNORMAL LOW (ref 12.0–15.0)
Immature Granulocytes: 0 %
Lymphocytes Relative: 11 %
Lymphs Abs: 0.9 10*3/uL (ref 0.7–4.0)
MCH: 34.3 pg — ABNORMAL HIGH (ref 26.0–34.0)
MCHC: 32.7 g/dL (ref 30.0–36.0)
MCV: 104.9 fL — ABNORMAL HIGH (ref 80.0–100.0)
Monocytes Absolute: 0.8 10*3/uL (ref 0.1–1.0)
Monocytes Relative: 9 %
Neutro Abs: 6.7 10*3/uL (ref 1.7–7.7)
Neutrophils Relative %: 79 %
Platelets: 481 10*3/uL — ABNORMAL HIGH (ref 150–400)
RBC: 2.83 MIL/uL — ABNORMAL LOW (ref 3.87–5.11)
RDW: 16.7 % — ABNORMAL HIGH (ref 11.5–15.5)
WBC: 8.5 10*3/uL (ref 4.0–10.5)
nRBC: 0 % (ref 0.0–0.2)

## 2023-03-10 LAB — COMPREHENSIVE METABOLIC PANEL
ALT: 14 U/L (ref 0–44)
AST: 15 U/L (ref 15–41)
Albumin: 3.8 g/dL (ref 3.5–5.0)
Alkaline Phosphatase: 59 U/L (ref 38–126)
Anion gap: 10 (ref 5–15)
BUN: 30 mg/dL — ABNORMAL HIGH (ref 8–23)
CO2: 25 mmol/L (ref 22–32)
Calcium: 9.5 mg/dL (ref 8.9–10.3)
Chloride: 104 mmol/L (ref 98–111)
Creatinine, Ser: 0.84 mg/dL (ref 0.44–1.00)
GFR, Estimated: >60 mL/min (ref >60)
Glucose, Bld: 97 mg/dL (ref 70–99)
Potassium: 4 mmol/L (ref 3.5–5.1)
Sodium: 139 mmol/L (ref 135–145)
Total Bilirubin: 0.4 mg/dL (ref 0.0–1.2)
Total Protein: 6.5 g/dL (ref 6.5–8.1)

## 2023-03-10 LAB — SAMPLE TO BLOOD BANK

## 2023-03-10 LAB — MAGNESIUM: Magnesium: 2 mg/dL (ref 1.7–2.4)

## 2023-03-10 MED ORDER — HEPARIN SOD (PORK) LOCK FLUSH 100 UNIT/ML IV SOLN
500.0000 [IU] | Freq: Once | INTRAVENOUS | Status: AC
  Administered 2023-03-10: 500 [IU] via INTRAVENOUS

## 2023-03-10 MED ORDER — SODIUM CHLORIDE 0.9% FLUSH
10.0000 mL | INTRAVENOUS | Status: DC | PRN
  Administered 2023-03-10: 10 mL via INTRAVENOUS

## 2023-03-10 NOTE — Patient Instructions (Signed)
[facility]  Discharge Instructions  You were seen and examined today by Dr. Anders Simmonds.  Dr. Anders Simmonds has reviewed your recent CT scan which revealed progression of the cancer - this means that despite out efforts and your current treatment, your lung cancer has gotten worse. Because of this, Dr. Anders Simmonds has discussed having to change treatment.  Dr. Anders Simmonds has ordered a repeat brain MRI to ensure there is no spread of cancer to the brain. We will also refer you back to [facility] for additional biopsy to ensure this is the same cancer.  Follow-up as scheduled.  Thank you for choosing [facility] to provide your oncology and hematology care.   To afford each patient quality time with our provider, please arrive at least 15 minutes before your scheduled appointment time. You may need to reschedule your appointment if you arrive late (10 or more minutes). Arriving late affects you and other patients whose appointments are after yours.  Also, if you miss three or more appointments without notifying the office, you may be dismissed from the clinic at the provider's discretion.    Again, thank you for choosing [facility].  Our hope is that these requests will decrease the amount of time that you wait before being seen by our physicians.   If you have a lab appointment with the Cancer Center - please note that after April 8th, all labs will be drawn in the cancer center.  You do not have to check in or register with the main entrance as you have in the past but will complete your check-in at the cancer center.            _____________________________________________________________  Should you have questions after your visit to [facility], please contact our office at [phone] and follow the prompts.  Our office hours are 8:00 a.m. to 4:30 p.m. Monday - Thursday and 8:00 a.m. to 2:30 p.m. Friday.  Please note that  voicemails left after 4:00 p.m. may not be returned until the following business day.  We are closed weekends and all major holidays.  You do have access to a nurse 24-7, just call the main number to the clinic [phone] and do not press any options, hold on the line and a nurse will answer the phone.    For prescription refill requests, have your pharmacy contact our office and allow 72 hours.    Masks are no longer required in the cancer centers. If you would like for your care team to wear a mask while they are taking care of you, please let them know. You may have one support person who is at least 71 years old accompany you for your appointments.

## 2023-03-11 ENCOUNTER — Encounter: Payer: Self-pay | Admitting: Oncology

## 2023-03-11 ENCOUNTER — Other Ambulatory Visit: Payer: Self-pay

## 2023-03-11 NOTE — Assessment & Plan Note (Signed)
Likely secondary to chemotherapy.  Iron panel and ferritin within normal limits -Continue to monitor for now

## 2023-03-11 NOTE — Assessment & Plan Note (Signed)
-  Patient is an avid smoker with history of smoking 2 packs/day until recently -Patient continues to smoke -Discussed the risks of smoking including faster progression of cancer, inflammation causing difficulties with chemotherapy and radiation -Offered help to quit smoking, patient is reluctant at this time but stated that she has cut down a lot

## 2023-03-11 NOTE — Progress Notes (Signed)
Patient Care Team: Lianne Moris, PA-C as PCP - General (Family Medicine) Wendall Stade, MD as PCP - Cardiology (Cardiology) Cindie Crumbly, MD as Medical Oncologist (Medical Oncology) Therese Sarah, RN as Oncology Nurse Navigator (Medical Oncology)  Clinic Day:  03/11/2023  Referring physician: Lianne Moris, PA-C   CHIEF COMPLAINT:  CC: Stage IVA Small cell lug carcinoma    ASSESSMENT & PLAN:   Assessment & Plan: Virginia Powell  is a 73 y.o. 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 Treatment response scan consistent with progression of disease.  -Discussed the patient's case at lung tumor board, consensus to treat it like a limited stage small cell lung cancer with chemoRT -MRI of brain to rule out metastasis. -Plan biopsy of new 2cm left lower lung nodule to confirm disease progression. -Refer to Dr. Eloise Harman at [facility] for evaluation for clinical trial or alternative treatment options (Lubrinectidin or Tarlatamab). -Consider interim treatment with Lubrinectidin if there are delays in starting new treatment plan. -RTC in 2-3 weeks  Smokes cigarettes -Patient is an avid smoker with history of smoking 2 packs/day until recently -Patient continues to smoke -Discussed the risks of smoking including faster progression of cancer, inflammation causing difficulties with chemotherapy and radiation -Offered help to quit smoking, patient is reluctant at this time but stated that she has cut down a lot  Anemia Likely secondary to chemotherapy.  Iron panel and ferritin within normal limits -Continue to monitor for now   The patient understands the plans discussed today and is in agreement with them.  She knows to contact our office if she develops concerns prior to her next  appointment.  I provided 30 minutes of face-to-face time during this encounter and > 50% was spent counseling as documented under my assessment and plan.    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 Growing solid nodule of the right lower lobe measuring 8 mm and new irregular solid nodule of the left lower lobe measuring 9.3 mm. Lung-RADS 4B, suspicious. Additional imaging evaluation or consultation with Pulmonology or Thoracic Surgery recommended.   09/09/2022 PET scan   IMPRESSION: 1. Hypermetabolic right hilar adenopathy and bilateral pulmonary nodules, findings indicative of synchronous primary bronchogenic carcinomas. No evidence of distant metastatic disease. 2. Vague slight thickening of lateral breast soft tissue with metabolism just below blood pool. Consider mammographic correlation, as clinically indicated. 3. Slight marginal irregularity of the liver raises suspicion for cirrhosis. 4. Bilateral adrenal adenomas.   10/02/2022 Imaging   CT chest without contrast: IMPRESSION: 1. No significant change in the appearance of tracer avid nodules within the medial right lower lobe and superior segment of left lower lobe. 2. Stable appearance of tracer avid nodal conglomeration noted within the right hilum. Which is concerning for nodal metastasis. 3. Stable appearance of bilateral adrenal adenomas. No follow-up imaging recommended.   10/04/2022 Pathology Results   A. LUNG, LLL, FINE NEEDLE ASPIRATION:  - No malignant cells identified  - Granulomatous inflammation   B. LUNG, LLL, BRUSHING:  - No  malignant cells identified  - Granulomatous inflammation D. LUNG, RLL, FINE NEEDLE  ASPIRATION:  - No malignant cells identified   E. 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 Interval enlargement of the superior segment left lower lobe lesion with stable hypermetabolism. 2. Stable 9 mm medial right lower lobe pulmonary nodule with slightly increased SUV max. 3. Persistent hypermetabolic right hilar adenopathy. 4. New 10 mm sub solid lesion in the left lower lobe with SUV max of 2.14. This is likely inflammatory. Attention on future studies is suggested. 5. Stable 14 mm linear density in the right lateral breast with low level FDG uptake. An indolent breast cancer is possible. 6. No findings for abdominal/pelvic metastatic disease or osseous metastatic disease. 7. Stable benign bilateral adrenal gland adenomas.   11/22/2022 Imaging   MRI Brain: No evidence of intracranial metastatic disease.     11/30/2022 - 02/04/2023 Chemotherapy   Patient is on Treatment Plan : LUNG SMALL CELL Cisplatin (80) D1 + Etoposide (100) D1-3 q21d      12/13/2022 - 01/07/2023 Radiation Therapy   RT with Dr.Morris at [facility]   02/17/2023 Imaging   IMPRESSION: CHEST:   1. New bilateral pulmonary nodules. 2. Interval decrease in size of RIGHT hilar lymph nodes. 3. Decrease in size of previously described pulmonary nodules.   PELVIS:   1. No evidence of metastatic disease in the abdomen pelvis. 2. Stable benign adrenal adenomas. 3. Sigmoid diverticulosis without diverticulitis.       Current Treatment:  Chemo RT with Cisplatin + Etoposide  INTERVAL HISTORY:  [name] is here today for follow up.  She is accompanied by her husband today .  She reported significant weakness and vomiting for about a week following the treatment. Despite taking anti-nausea medication, the patient experienced episodes of  vomiting without warning, approximately thirty minutes after eating. She did not contact the medical team during this period, instead attempting to manage the symptoms by varying her diet. The patient reported that her appetite and taste buds have returned in the last five to ten days, and she has been able to maintain a diet of foods she can taste.  The patient also reports that she has been feeling better in the last ten days, since stopping chemotherapy. She has been active around the house, cooking and cleaning, although she reports that she was too weak to take down her Christmas decorations. The patient also mentions that she has not quit smoking.   I have reviewed the past medical history, past surgical history, social history and family history with the patient and they are unchanged from previous note.  ALLERGIES:  is allergic to morphine and codeine.  MEDICATIONS:  Current Outpatient Medications  Medication Sig Dispense Refill   albuterol (PROVENTIL) (2.5 MG/3ML) 0.083% nebulizer solution SMARTSIG:1 Vial(s) Via Nebulizer Every 4-6 Hours PRN     albuterol (VENTOLIN HFA) 108 (90 Base) MCG/ACT inhaler SMARTSIG:2 Puff(s) By Mouth Every 4 Hours     atorvastatin (LIPITOR) 40 MG tablet Take 40 mg by mouth daily.     azithromycin (ZITHROMAX) 500 MG tablet Take 500 mg by mouth daily.     CISPLATIN IV Inject into the vein every 21 ( twenty-one) days.     ETOPOSIDE IV Inject into the vein every 21 ( twenty-one) days.  fexofenadine (ALLEGRA) 180 MG tablet Take 180 mg by mouth daily as needed for allergies or rhinitis.     fluticasone (FLONASE) 50 MCG/ACT nasal spray Place into both nostrils daily as needed.     furosemide (LASIX) 20 MG tablet Take 20 mg by mouth daily.     gabapentin (NEURONTIN) 300 MG capsule Take 600 mg by mouth at bedtime.     hydrochlorothiazide (HYDRODIURIL) 12.5 MG tablet Take 12.5 mg by mouth daily.     hydrOXYzine (ATARAX) 25 MG tablet Take 1 tablet by mouth at  bedtime.     lidocaine-prilocaine (EMLA) cream Apply a quarter-sized amount to port a cath site and cover with plastic wrap 1 hour prior to infusion appointments 30 g 3   loratadine (CLARITIN) 10 MG tablet Take 10 mg by mouth daily.     olmesartan (BENICAR) 40 MG tablet Take 1 tablet (40 mg total) by mouth daily. 30 tablet 0   prochlorperazine (COMPAZINE) 10 MG tablet Take 1 tablet (10 mg total) by mouth every 6 (six) hours as needed. 60 tablet 2   rizatriptan (MAXALT-MLT) 10 MG disintegrating tablet Take 10 mg by mouth as needed for migraine. May repeat in 2 hours if needed     sucralfate (CARAFATE) 1 GM/10ML suspension Take 10 mLs (1 g total) by mouth 4 (four) times daily -  with meals and at bedtime. 420 mL 0   TRELEGY ELLIPTA 100-62.5-25 MCG/ACT AEPB Inhale 1 puff into the lungs daily.     No current facility-administered medications for this visit.     REVIEW OF SYSTEMS:   Constitutional: Denies fevers, chills or abnormal weight loss Eyes: Denies blurriness of vision Ears, nose, mouth, throat, and face: Denies mucositis or sore throat Respiratory: Denies cough, dyspnea or wheezes Cardiovascular: Denies palpitation, chest discomfort or lower extremity swelling Gastrointestinal:  Denies nausea, heartburn or change in bowel habits Skin: Denies abnormal skin rashes Lymphatics: Denies new lymphadenopathy or easy bruising Neurological:Denies numbness, tingling or new weaknesses Behavioral/Psych: Mood is stable, no new changes  All other systems were reviewed with the patient and are negative.   VITALS:  Blood pressure 124/88, pulse 99, temperature 98.6 F (37 C), temperature source Tympanic, resp. rate 18, weight 106 lb 6.4 oz (48.3 kg), SpO2 100%.  Wt Readings from Last 3 Encounters:  03/10/23 106 lb 6.4 oz (48.3 kg)  01/31/23 108 lb (49 kg)  01/10/23 108 lb 7.5 oz (49.2 kg)    Performance status (ECOG): 0 - Asymptomatic  PHYSICAL EXAM:   GENERAL:alert, no distress and  comfortable SKIN: skin color, texture, turgor are normal, no rashes or significant lesions LUNGS: clear to auscultation and percussion with normal breathing effort HEART: regular rate & rhythm and no murmurs and no lower extremity edema ABDOMEN:abdomen soft, non-tender and normal bowel sounds Musculoskeletal:no cyanosis of digits and no clubbing  NEURO: alert & oriented x 3 with fluent speech  LABORATORY DATA:  I have reviewed the data as listed    Component Value Date/Time   NA 139 03/10/2023 1420   K 4.0 03/10/2023 1420   CL 104 03/10/2023 1420   CO2 25 03/10/2023 1420   GLUCOSE 97 03/10/2023 1420   BUN 30 (H) 03/10/2023 1420   CREATININE 0.84 03/10/2023 1420   CALCIUM 9.5 03/10/2023 1420   PROT 6.5 03/10/2023 1420   ALBUMIN 3.8 03/10/2023 1420   AST 15 03/10/2023 1420   ALT 14 03/10/2023 1420   ALKPHOS 59 03/10/2023 1420   BILITOT 0.4 03/10/2023 1420  GFRNONAA >60 03/10/2023 1420     Lab Results  Component Value Date   WBC 8.5 03/10/2023   NEUTROABS 6.7 03/10/2023   HGB 9.7 (L) 03/10/2023   HCT 29.7 (L) 03/10/2023   MCV 104.9 (H) 03/10/2023   PLT 481 (H) 03/10/2023      Chemistry      Component Value Date/Time   NA 139 03/10/2023 1420   K 4.0 03/10/2023 1420   CL 104 03/10/2023 1420   CO2 25 03/10/2023 1420   BUN 30 (H) 03/10/2023 1420   CREATININE 0.84 03/10/2023 1420      Component Value Date/Time   CALCIUM 9.5 03/10/2023 1420   ALKPHOS 59 03/10/2023 1420   AST 15 03/10/2023 1420   ALT 14 03/10/2023 1420   BILITOT 0.4 03/10/2023 1420     RADIOGRAPHIC STUDIES:  I have personally reviewed the radiological images as listed and agreed with the findings in the report.   CT CHEST ABDOMEN PELVIS W CONTRAST CLINICAL DATA:  Small cell lung cancer with lung metastasis. * Tracking Code: BO *  EXAM: CT CHEST, ABDOMEN, AND PELVIS WITH CONTRAST  TECHNIQUE: Multidetector CT imaging of the chest, abdomen and pelvis was performed following the standard  protocol during bolus administration of intravenous contrast.  RADIATION DOSE REDUCTION: This exam was performed according to the departmental dose-optimization program which includes automated exposure control, adjustment of the mA and/or kV according to patient size and/or use of iterative reconstruction technique.  CONTRAST:  OMNIPAQUE IOHEXOL 300 MG/ML  SOLN  COMPARISON:  PET-CT 11/18/2022  FINDINGS: CT CHEST FINDINGS  Cardiovascular: Port in the anterior chest wall with tip in distal SVC.  Mediastinum/Nodes: Interval decrease in size of RIGHT hilar lymph nodes which are now not pathologic by size criteria.  Lungs/Pleura: Unfortunately, there are new bilateral pulmonary nodules.  RIGHT upper lobe nodule measuring 7 mm image 53/3)  RIGHT upper lobe nodule measuring 12 mm on 60.  RIGHT lower lobe new large nodule measuring 23 mm on image 73/3.  Previously small round pulmonary nodule lobe adjacent the fiducial markers not seen  New LEFT upper lobe pulmonary nodule measuring 9 mm on image 41)  Previous described LEFT lower lobe nodule measures 20 mm (image 43). This nodule is decreased in thickness compared to prior.  However additional new posterior LEFT lobe nodule is present on on image 64 measuring 10 mm.  Additional new spiculated LEFT lobe nodule on image 59.  Musculoskeletal: No aggressive osseous lesion.  CT ABDOMEN AND PELVIS FINDINGS  Hepatobiliary: Multiple hypodense lesions in liver favored benign cysts.  Pancreas: Pancreas is normal. No ductal dilatation. No pancreatic inflammation.  Spleen: Normal spleen  Adrenals/urinary tract: Large benign adrenal adenoma of the LEFT and RIGHT adrenal glands are unchanged. Kidneys ureters and bladder normal.  Stomach/Bowel: Stomach, small bowel, appendix, and cecum are normal. Multiple diverticula of the descending colon and sigmoid colon without acute inflammation.  Vascular/Lymphatic: Abdominal  aorta is normal caliber. There is no retroperitoneal or periportal lymphadenopathy. No pelvic lymphadenopathy.  Reproductive: Post hysterectomy.  Adnexa unremarkable  Other: No free fluid.  Musculoskeletal: No aggressive osseous lesion.  IMPRESSION: CHEST:  1. New bilateral pulmonary nodules. 2. Interval decrease in size of RIGHT hilar lymph nodes. 3. Decrease in size of previously described pulmonary nodules.  PELVIS:  1. No evidence of metastatic disease in the abdomen pelvis. 2. Stable benign adrenal adenomas. 3. Sigmoid diverticulosis without diverticulitis.  Electronically Signed   By: Genevive Bi M.D.   On:  02/26/2023 16:40   

## 2023-03-11 NOTE — Assessment & Plan Note (Signed)
Oncology history below. 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 Treatment response scan consistent with progression of disease.  -Discussed the patient's case at lung tumor board, consensus to treat it like a limited stage small cell lung cancer with chemoRT -MRI of brain to rule out metastasis. -Plan biopsy of new 2cm left lower lung nodule to confirm disease progression. -Refer to Dr. Eloise Harman at [facility] for evaluation for clinical trial or alternative treatment options (Lubrinectidin or Tarlatamab). -Consider interim treatment with Lubrinectidin if there are delays in starting new treatment plan. -RTC in 2-3 weeks

## 2023-03-15 ENCOUNTER — Other Ambulatory Visit: Payer: Self-pay | Admitting: Emergency Medicine

## 2023-03-15 DIAGNOSIS — Z681 Body mass index (BMI) 19 or less, adult: Secondary | ICD-10-CM | POA: Diagnosis not present

## 2023-03-15 DIAGNOSIS — R609 Edema, unspecified: Secondary | ICD-10-CM | POA: Diagnosis not present

## 2023-03-15 DIAGNOSIS — H9202 Otalgia, left ear: Secondary | ICD-10-CM | POA: Diagnosis not present

## 2023-03-15 DIAGNOSIS — R918 Other nonspecific abnormal finding of lung field: Secondary | ICD-10-CM

## 2023-03-15 NOTE — Progress Notes (Signed)
Will schedule robotic assisted navigational bronchoscopy to evaluate 2 cm left lower lobe pulmonary nodule.  Patient with a history of small cell lung cancer

## 2023-03-16 ENCOUNTER — Encounter: Payer: Self-pay | Admitting: Emergency Medicine

## 2023-03-16 ENCOUNTER — Other Ambulatory Visit: Payer: Self-pay

## 2023-03-16 DIAGNOSIS — C3490 Malignant neoplasm of unspecified part of unspecified bronchus or lung: Secondary | ICD-10-CM | POA: Diagnosis not present

## 2023-03-17 ENCOUNTER — Other Ambulatory Visit: Payer: Self-pay

## 2023-03-17 ENCOUNTER — Ambulatory Visit (HOSPITAL_COMMUNITY)
Admission: RE | Admit: 2023-03-17 | Discharge: 2023-03-17 | Disposition: A | Discharge status: Home / Self Care | Payer: PPO | Source: Ambulatory Visit | Attending: Oncology | Admitting: Oncology

## 2023-03-17 DIAGNOSIS — C7802 Secondary malignant neoplasm of left lung: Secondary | ICD-10-CM | POA: Insufficient documentation

## 2023-03-17 DIAGNOSIS — C7801 Secondary malignant neoplasm of right lung: Secondary | ICD-10-CM | POA: Insufficient documentation

## 2023-03-17 DIAGNOSIS — C3490 Malignant neoplasm of unspecified part of unspecified bronchus or lung: Secondary | ICD-10-CM | POA: Diagnosis not present

## 2023-03-17 MED ORDER — GADOBUTROL 1 MMOL/ML IV SOLN
5.0000 mL | Freq: Once | INTRAVENOUS | Status: AC | PRN
  Administered 2023-03-17: 5 mL via INTRAVENOUS

## 2023-03-22 DIAGNOSIS — C7802 Secondary malignant neoplasm of left lung: Secondary | ICD-10-CM | POA: Diagnosis not present

## 2023-03-22 DIAGNOSIS — C7801 Secondary malignant neoplasm of right lung: Secondary | ICD-10-CM | POA: Diagnosis not present

## 2023-03-26 DIAGNOSIS — Z681 Body mass index (BMI) 19 or less, adult: Secondary | ICD-10-CM | POA: Diagnosis not present

## 2023-03-26 DIAGNOSIS — R509 Fever, unspecified: Secondary | ICD-10-CM | POA: Diagnosis not present

## 2023-03-26 DIAGNOSIS — Z2089 Contact with and (suspected) exposure to other communicable diseases: Secondary | ICD-10-CM | POA: Diagnosis not present

## 2023-03-26 DIAGNOSIS — Z20828 Contact with and (suspected) exposure to other viral communicable diseases: Secondary | ICD-10-CM | POA: Diagnosis not present

## 2023-03-26 DIAGNOSIS — H9202 Otalgia, left ear: Secondary | ICD-10-CM | POA: Diagnosis not present

## 2023-03-28 ENCOUNTER — Encounter (HOSPITAL_COMMUNITY): Payer: Self-pay | Admitting: Emergency Medicine

## 2023-03-28 ENCOUNTER — Other Ambulatory Visit: Payer: Self-pay

## 2023-03-28 NOTE — Progress Notes (Signed)
 Anesthesia Chart Review: Virginia Powell   Case: [id number] Date/Time: 03/29/23 1230   Procedure: ROBOTIC ASSISTED NAVIGATIONAL BRONCHOSCOPY   Anesthesia type: General   Pre-op diagnosis: BILATERAL PULMONARY NODULES   [facility] / [facility]   Surgeons: Virginia Flake, MD       DISCUSSION: Patient is a 73 year old female scheduled for the above procedure.  History includes smoking, post-operative N/V, HTN, HLD, COPD, small cell lung cancer (10/04/22; s/p chemo RT (Cis/Etop) completed on 02/04/23), dyspnea, psoriasis. Right internal jugular Port-a-cath 11/17/22.  Last visit with oncology was on 03/10/23 with Dr. Orvis Powell for Stage IVA small cell lung cancer.  Treatment response scan on 02/17/23 showed new bilateral pulmonary nodules with decrease size of previous pulmonary nodules and right high lymph nodes. Per A/P: "-Discussed the patient's case at lung tumor board, consensus to treat it like a limited stage small cell lung cancer with chemoRT -MRI of brain to rule out metastasis. -Plan biopsy of new 2cm left lower lung nodule to confirm disease progression. -Refer to Dr. Laren Powell at [facility] for evaluation for clinical trial or alternative treatment options (Lubrinectidin or Tarlatamab). -Consider interim treatment with Lubrinectidin if there are delays in starting new treatment plan. -RTC in 2-3 weeks". Smoking cessation encouraged, she has been trying to cut down.   Evaluated by [facility], MD on 03/21/22: "73 y.o. female w/history of limited stage SCLC s/p concurrent chemoradiation with post-treatment imaging demonstrating new nodules. Imaging reviewed at multidisciplinary thoracic tumor board. Given appearance of nodules and response in hilar disease, concern for potential infectious/inflammatory etiology. We discussed repeating scans/consideration of bronchoscopy and will follow-up her local testing. We discussed potential role for tarlatamab, clinical  trials if platinum refractory disease but would to confirm metastatic disease prior to proceeding with additional systemic therapy. If no progression, then would initiate durvalumab  consolidation per the ADRIATIC study Virginia Powell et al Virginia Powell 2024)."  H/H 9.7/29.7 at visit 03/10/23 visit with Dr. Orvis Powell who felt anemia likely secondary to chemotherapy. Recent iron panel and ferritin within normal limites. Continue to monitor recommended.   She is currently on clindamycin for "ear infection" first diagnosed on 03/15/23. She reportedly developed a runny nose and dry cough which have since subsided by 2/6//25 or 03/25/23. She had follow-up at [facility] on 03/26/23 and another course of clindamycin was prescribed for her ear. She says she was given a steroid injection and had negative testing for COVID, Flu, and RSV. She denied current cold symptoms. Communication sent to Dr. Baldwin Powell.   Anesthesia team to evaluate on the day of surgery.    VS:  Wt Readings from Last 3 Encounters:  03/10/23 48.3 kg  01/31/23 49 kg  01/10/23 49.2 kg   BP Readings from Last 3 Encounters:  03/10/23 124/88  02/04/23 133/89  02/02/23 (!) 136/90   Pulse Readings from Last 3 Encounters:  03/10/23 99  02/04/23 100  02/02/23 80     PROVIDERS: Virginia Jarred, PA-C is PCP ([facility]) Virginia Grade, MD is HEM-ONC at [facility] and Virginia Aly, MD at [facility], Virginia Atkinson, MD is RAD-ONC   LABS: Most recent lab results in [facility] include: Lab Results  Component Value Date   WBC 8.5 03/10/2023   HGB 9.7 (L) 03/10/2023   HCT 29.7 (L) 03/10/2023   PLT 481 (H) 03/10/2023   GLUCOSE 97 03/10/2023   ALT 14 03/10/2023   AST 15 03/10/2023   NA 139 03/10/2023   K 4.0  03/10/2023   CL 104 03/10/2023   CREATININE 0.84 03/10/2023   BUN 30 (H) 03/10/2023   CO2 25 03/10/2023      Latest Ref Rng & Units 03/10/2023    2:20 PM 01/31/2023    7:59 AM 01/10/2023    7:59 AM  CBC  WBC 4.0 - 10.5 K/uL 8.5  9.2  13.3    Hemoglobin 12.0 - 15.0 g/dL 9.7  7.7  9.1   Hematocrit 36.0 - 46.0 % 29.7  24.7  27.1   Platelets 150 - 400 K/uL 481  371  498      IMAGES: MRI Brain 03/17/23: In process.  CT Chest/abd/pelvis 02/17/23: IMPRESSION: CHEST: 1. New bilateral pulmonary nodules. 2. Interval decrease in size of RIGHT hilar lymph nodes. 3. Decrease in size of previously described pulmonary nodules.   PELVIS: 1. No evidence of metastatic disease in the abdomen pelvis. 2. Stable benign adrenal adenomas. 3. Sigmoid diverticulosis without diverticulitis.  PET Scan 11/18/22: IMPRESSION: 1. Interval enlargement of the superior segment left lower lobe lesion with stable hypermetabolism. 2. Stable 9 mm medial right lower lobe pulmonary nodule with slightly increased SUV max. 3. Persistent hypermetabolic right hilar adenopathy. 4. New 10 mm sub solid lesion in the left lower lobe with SUV max of 2.14. This is likely inflammatory. Attention on future studies is suggested. 5. Stable 14 mm linear density in the right lateral breast with low level FDG uptake. An indolent breast cancer is possible. 6. No findings for abdominal/pelvic metastatic disease or osseous metastatic disease. 7. Stable benign bilateral adrenal gland adenomas.     EKG: EKG 12/04/21:  Sinus tachycardia at 123 bpm Right atrial enlargement Left axis deviation Right bundle branch block Inferior infarct, age undetermined Anterolateral infarct, age undetermined   CV: Echo 12/29/21: IMPRESSIONS   1. Left ventricular ejection fraction, by estimation, is 60 to 65%. The  left ventricle has normal function. The left ventricle has no regional  wall motion abnormalities. Left ventricular diastolic parameters are  consistent with Powell I diastolic  dysfunction (impaired relaxation).   2. Right ventricular systolic function is normal. The right ventricular  size is normal. Tricuspid regurgitation signal is inadequate for assessing  PA  pressure.   3. The mitral valve is normal in structure. Trivial mitral valve  regurgitation. No evidence of mitral stenosis.   4. The aortic valve is tricuspid. Aortic valve regurgitation is not  visualized. No aortic stenosis is present.   5. The inferior vena cava is normal in size with greater than 50%  respiratory variability, suggesting right atrial pressure of 3 mmHg.   Nuclear stress test 03/11/15: Patient with limited exercise tolerance. Technically intermediate risk Duke treadmill score 3.5, although no diagnostic ST segment changes. There was a hypertensive response and patient reported significant fatigue. Blood pressure demonstrated a hypertensive response to exercise. Small, mild intensity, apical anteroseptal defect that is fixed and consistent with soft tissue attenuation. No definitive evidence of ischemia. This is a low risk study based on perfusion imaging. Nuclear stress EF: 84%.    Past Medical History:  Diagnosis Date   Complication of anesthesia    COPD (chronic obstructive pulmonary disease) (HCC)    Dyspnea    Essential hypertension    Headache    Hyperlipidemia    Hypertension    Migraines    Pneumonia 04/2018   PONV (postoperative nausea and vomiting)    Psoriasis    Seasonal allergies    Smoker     Past Surgical  History:  Procedure Laterality Date   ABDOMINAL HYSTERECTOMY     BRONCHIAL BIOPSY  10/04/2022   Procedure: BRONCHIAL BIOPSIES;  Surgeon: Virginia Flake, MD;  Location: [facility] ENDOSCOPY;  Service: Pulmonary;;   BRONCHIAL BRUSHINGS  10/04/2022   Procedure: BRONCHIAL BRUSHINGS;  Surgeon: Virginia Flake, MD;  Location: [facility] ENDOSCOPY;  Service: Pulmonary;;   BRONCHIAL NEEDLE ASPIRATION BIOPSY  10/04/2022   Procedure: BRONCHIAL NEEDLE ASPIRATION BIOPSIES;  Surgeon: Virginia Flake, MD;  Location: [facility] ENDOSCOPY;  Service: Pulmonary;;   BRONCHIAL WASHINGS  10/04/2022   Procedure: BRONCHIAL WASHINGS;  Surgeon: Virginia Flake, MD;  Location: [facility] ENDOSCOPY;   Service: Pulmonary;;   CATARACT EXTRACTION Bilateral    FIDUCIAL MARKER PLACEMENT  10/04/2022   Procedure: FIDUCIAL MARKER PLACEMENT;  Surgeon: Virginia Flake, MD;  [facility];  Service: Pulmonary;;   IR IMAGING GUIDED PORT INSERTION  11/17/2022   PARTIAL HYSTERECTOMY     TUBAL LIGATION     VIDEO BRONCHOSCOPY WITH ENDOBRONCHIAL ULTRASOUND N/A 10/04/2022   Procedure: VIDEO BRONCHOSCOPY WITH ENDOBRONCHIAL ULTRASOUND;  Surgeon: Virginia Flake, MD;  Location[facility] ENDOSCOPY;  Service: Pulmonary;  Laterality: N/A;    MEDICATIONS: No current facility-administered medications for this encounter.    benzonatate (TESSALON) 100 MG capsule   clindamycin (CLEOCIN) 300 MG capsule   albuterol (PROVENTIL) (2.5 MG/3ML) 0.083% nebulizer solution   albuterol (VENTOLIN HFA) 108 (90 Base) MCG/ACT inhaler   atorvastatin (LIPITOR) 40 MG tablet   azithromycin (ZITHROMAX) 500 MG tablet   CISPLATIN  IV   ETOPOSIDE  IV   fexofenadine (ALLEGRA) 180 MG tablet   fluticasone (FLONASE) 50 MCG/ACT nasal spray   furosemide (LASIX) 20 MG tablet   gabapentin (NEURONTIN) 300 MG capsule   hydrochlorothiazide (HYDRODIURIL) 12.5 MG tablet   hydrOXYzine (ATARAX) 25 MG tablet   lidocaine -prilocaine  (EMLA ) cream   loratadine (CLARITIN) 10 MG tablet   olmesartan  (BENICAR ) 40 MG tablet   prochlorperazine  (COMPAZINE ) 10 MG tablet   rizatriptan (MAXALT-MLT) 10 MG disintegrating tablet   sucralfate  (CARAFATE ) 1 GM/10ML suspension   TRELEGY ELLIPTA 100-62.5-25 MCG/ACT AEPB  She is no longer on azithromycin.    Ella Gun, PA-C Surgical Short Stay/Anesthesiology [facility] Phone [phone] [facility] Phone [phone] 03/28/2023 3:51 PM

## 2023-03-28 NOTE — Anesthesia Preprocedure Evaluation (Addendum)
Anesthesia Evaluation  Patient identified by MRN, date of birth, ID band Patient awake    Reviewed: Allergy & Precautions, NPO status , Patient's Chart, lab work & pertinent test results  History of Anesthesia Complications (+) PONV and history of anesthetic complications  Airway Mallampati: II  TM Distance: >3 FB Neck ROM: Full    Dental  (+) Missing,    Pulmonary COPD,  COPD inhaler, Current Smoker and Patient abstained from smoking. Pulmonary nodules   Pulmonary exam normal        Cardiovascular hypertension, Pt. on medications Normal cardiovascular exam     Neuro/Psych  Headaches    GI/Hepatic   Endo/Other    Renal/GU      Musculoskeletal   Abdominal   Peds  Hematology  (+) Blood dyscrasia, anemia   Anesthesia Other Findings   Reproductive/Obstetrics                             Anesthesia Physical Anesthesia Plan  ASA: 3  Anesthesia Plan: General   Post-op Pain Management: Minimal or no pain anticipated   Induction: Intravenous  PONV Risk Score and Plan: 3 and Midazolam, Dexamethasone, Ondansetron and Treatment may vary due to age or medical condition  Airway Management Planned: Oral ETT  Additional Equipment: None  Intra-op Plan:   Post-operative Plan: Extubation in OR  Informed Consent: I have reviewed the patients History and Physical, chart, labs and discussed the procedure including the risks, benefits and alternatives for the proposed anesthesia with the patient or authorized representative who has indicated his/her understanding and acceptance.     Dental advisory given  Plan Discussed with: CRNA  Anesthesia Plan Comments: (PAT note written 03/28/2023 by Shonna Chock, PA-C.  )       Anesthesia Quick Evaluation

## 2023-03-28 NOTE — Progress Notes (Signed)
 SDW CALL  Patient was given pre-op instructions over the phone. The opportunity was given for the patient to ask questions. No further questions asked. Patient verbalized understanding of instructions given.   PCP - Fredick Jarred at [facility] Cardiologist - Janelle Mediate  PPM/ICD - denies Device Orders - n/a Rep Notified - n/a  Chest x-ray -  EKG - DOS Stress Test - 03/11/15  ECHO - 12/29/21 Cardiac Cath - denies  Sleep Study - denies CPAP - n/a  No DM  Last dose of GLP1 agonist-  n/a GLP1 instructions: n/a  Blood Thinner Instructions: n/a Aspirin Instructions: n/a  ERAS Protcol - NPO PRE-SURGERY Ensure or G2- n/a  COVID TEST- no   Anesthesia review: yes - recent ear infection and/or cold like symptoms and has completed one dose of antibiotics but started on a second dose.   First diagnosed with the ear infection on January 28th. Patient went to [facility] for treatment orientation and they thought she had a cold. She made an appointment for this past Saturday to follow-up on her ear and informed them that [facility] told her she had a cold. Her symptoms at that time was a runny nose and a dry cough. Her PCP have her a steroid shot and tested her for covid, flu, and rsv and all were negative. They did prescribe another round of clindamycin for the ear. She states that she feels good now. Reports not issues. Cold symptoms went away on Thursday or Friday last week.   Records requested from her PCP and Ella Gun was reaching out to Dr. Baldwin Levee.     All instructions explained to the patient, with a verbal understanding of the material. Patient agrees to go over the instructions while at home for a better understanding.

## 2023-03-29 ENCOUNTER — Ambulatory Visit (HOSPITAL_COMMUNITY)
Admission: RE | Admit: 2023-03-29 | Discharge: 2023-03-29 | Disposition: A | Discharge status: Home / Self Care | Payer: PPO | Attending: Emergency Medicine | Admitting: Emergency Medicine

## 2023-03-29 ENCOUNTER — Ambulatory Visit (HOSPITAL_COMMUNITY): Payer: PPO

## 2023-03-29 ENCOUNTER — Ambulatory Visit (HOSPITAL_BASED_OUTPATIENT_CLINIC_OR_DEPARTMENT_OTHER): Payer: PPO | Admitting: Vascular Surgery

## 2023-03-29 ENCOUNTER — Encounter (HOSPITAL_COMMUNITY): Payer: Self-pay | Admitting: Emergency Medicine

## 2023-03-29 ENCOUNTER — Ambulatory Visit (HOSPITAL_COMMUNITY): Payer: Self-pay | Admitting: Vascular Surgery

## 2023-03-29 ENCOUNTER — Other Ambulatory Visit: Payer: Self-pay

## 2023-03-29 ENCOUNTER — Encounter (HOSPITAL_COMMUNITY)
Admission: RE | Disposition: A | Discharge status: Home / Self Care | Payer: Self-pay | Source: Home / Self Care | Attending: Emergency Medicine

## 2023-03-29 DIAGNOSIS — I10 Essential (primary) hypertension: Secondary | ICD-10-CM

## 2023-03-29 DIAGNOSIS — F17210 Nicotine dependence, cigarettes, uncomplicated: Secondary | ICD-10-CM | POA: Diagnosis not present

## 2023-03-29 DIAGNOSIS — Z9221 Personal history of antineoplastic chemotherapy: Secondary | ICD-10-CM | POA: Diagnosis not present

## 2023-03-29 DIAGNOSIS — C7801 Secondary malignant neoplasm of right lung: Secondary | ICD-10-CM | POA: Diagnosis not present

## 2023-03-29 DIAGNOSIS — E785 Hyperlipidemia, unspecified: Secondary | ICD-10-CM | POA: Diagnosis not present

## 2023-03-29 DIAGNOSIS — Z7951 Long term (current) use of inhaled steroids: Secondary | ICD-10-CM | POA: Insufficient documentation

## 2023-03-29 DIAGNOSIS — Z79899 Other long term (current) drug therapy: Secondary | ICD-10-CM | POA: Insufficient documentation

## 2023-03-29 DIAGNOSIS — J449 Chronic obstructive pulmonary disease, unspecified: Secondary | ICD-10-CM | POA: Diagnosis not present

## 2023-03-29 DIAGNOSIS — Z85118 Personal history of other malignant neoplasm of bronchus and lung: Secondary | ICD-10-CM | POA: Insufficient documentation

## 2023-03-29 DIAGNOSIS — R846 Abnormal cytological findings in specimens from respiratory organs and thorax: Secondary | ICD-10-CM | POA: Diagnosis not present

## 2023-03-29 DIAGNOSIS — Z48813 Encounter for surgical aftercare following surgery on the respiratory system: Secondary | ICD-10-CM | POA: Diagnosis not present

## 2023-03-29 DIAGNOSIS — R918 Other nonspecific abnormal finding of lung field: Secondary | ICD-10-CM | POA: Insufficient documentation

## 2023-03-29 DIAGNOSIS — C3490 Malignant neoplasm of unspecified part of unspecified bronchus or lung: Secondary | ICD-10-CM | POA: Diagnosis not present

## 2023-03-29 DIAGNOSIS — J9811 Atelectasis: Secondary | ICD-10-CM | POA: Diagnosis not present

## 2023-03-29 DIAGNOSIS — R911 Solitary pulmonary nodule: Secondary | ICD-10-CM

## 2023-03-29 DIAGNOSIS — C7802 Secondary malignant neoplasm of left lung: Secondary | ICD-10-CM | POA: Diagnosis not present

## 2023-03-29 DIAGNOSIS — I700 Atherosclerosis of aorta: Secondary | ICD-10-CM | POA: Diagnosis not present

## 2023-03-29 HISTORY — PX: BRONCHIAL WASHINGS: SHX5105

## 2023-03-29 HISTORY — DX: Dyspnea, unspecified: R06.00

## 2023-03-29 HISTORY — PX: FIDUCIAL MARKER PLACEMENT: SHX6858

## 2023-03-29 HISTORY — PX: BRONCHIAL NEEDLE ASPIRATION BIOPSY: SHX5106

## 2023-03-29 HISTORY — PX: BRONCHIAL BRUSHINGS: SHX5108

## 2023-03-29 HISTORY — PX: BRONCHIAL BIOPSY: SHX5109

## 2023-03-29 SURGERY — BRONCHOSCOPY, WITH BIOPSY USING ELECTROMAGNETIC NAVIGATION
Anesthesia: General

## 2023-03-29 MED ORDER — PHENYLEPHRINE 80 MCG/ML (10ML) SYRINGE FOR IV PUSH (FOR BLOOD PRESSURE SUPPORT)
PREFILLED_SYRINGE | INTRAVENOUS | Status: DC | PRN
  Administered 2023-03-29 (×4): 80 ug via INTRAVENOUS

## 2023-03-29 MED ORDER — FENTANYL CITRATE (PF) 100 MCG/2ML IJ SOLN
INTRAMUSCULAR | Status: AC
Start: 2023-03-29 — End: ?
  Filled 2023-03-29: qty 2

## 2023-03-29 MED ORDER — PHENYLEPHRINE HCL-NACL 20-0.9 MG/250ML-% IV SOLN
INTRAVENOUS | Status: DC | PRN
  Administered 2023-03-29: 20 ug/min via INTRAVENOUS

## 2023-03-29 MED ORDER — CHLORHEXIDINE GLUCONATE 0.12 % MT SOLN
OROMUCOSAL | Status: AC
  Administered 2023-03-29: 15 mL via OROMUCOSAL
  Filled 2023-03-29: qty 15

## 2023-03-29 MED ORDER — DEXAMETHASONE SODIUM PHOSPHATE 10 MG/ML IJ SOLN
INTRAMUSCULAR | Status: DC | PRN
  Administered 2023-03-29: 4 mg via INTRAVENOUS

## 2023-03-29 MED ORDER — LIDOCAINE 2% (20 MG/ML) 5 ML SYRINGE
INTRAMUSCULAR | Status: DC | PRN
  Administered 2023-03-29: 10 mg via INTRAVENOUS
  Administered 2023-03-29: 60 mg via INTRAVENOUS

## 2023-03-29 MED ORDER — ROCURONIUM BROMIDE 10 MG/ML (PF) SYRINGE
PREFILLED_SYRINGE | INTRAVENOUS | Status: DC | PRN
  Administered 2023-03-29: 50 mg via INTRAVENOUS
  Administered 2023-03-29: 10 mg via INTRAVENOUS

## 2023-03-29 MED ORDER — DROPERIDOL 2.5 MG/ML IJ SOLN: 0.6250 mg | Freq: Once | INTRAMUSCULAR | Status: DC | PRN

## 2023-03-29 MED ORDER — LACTATED RINGERS IV SOLN: INTRAVENOUS | Status: DC

## 2023-03-29 MED ORDER — PROPOFOL 10 MG/ML IV BOLUS
INTRAVENOUS | Status: DC | PRN
  Administered 2023-03-29: 120 mg via INTRAVENOUS
  Administered 2023-03-29: 20 mg via INTRAVENOUS

## 2023-03-29 MED ORDER — FENTANYL CITRATE (PF) 100 MCG/2ML IJ SOLN: 25.0000 ug | INTRAMUSCULAR | Status: DC | PRN

## 2023-03-29 MED ORDER — SUGAMMADEX SODIUM 200 MG/2ML IV SOLN
INTRAVENOUS | Status: DC | PRN
  Administered 2023-03-29: 100 mg via INTRAVENOUS

## 2023-03-29 MED ORDER — CHLORHEXIDINE GLUCONATE 0.12 % MT SOLN: 15.0000 mL | Freq: Once | OROMUCOSAL | Status: AC

## 2023-03-29 MED ORDER — ONDANSETRON HCL 4 MG/2ML IJ SOLN
INTRAMUSCULAR | Status: DC | PRN
  Administered 2023-03-29: 4 mg via INTRAVENOUS

## 2023-03-29 MED ORDER — SODIUM CHLORIDE 0.9 % IV SOLN: INTRAVENOUS | Status: DC | PRN

## 2023-03-29 SURGICAL SUPPLY — 1 items: superlock fiducial marker IMPLANT

## 2023-03-29 NOTE — Anesthesia Procedure Notes (Signed)
Procedure Name: Intubation Date/Time: 03/29/2023 12:32 PM  Performed by: Stanton Kidney, CRNAPre-anesthesia Checklist: Patient identified, Patient being monitored, Timeout performed, Emergency Drugs available and Suction available Patient Re-evaluated:Patient Re-evaluated prior to induction Oxygen Delivery Method: Circle system utilized Preoxygenation: Pre-oxygenation with 100% oxygen Induction Type: IV induction Ventilation: Mask ventilation without difficulty Laryngoscope Size: Mac and 3 Grade View: Grade I Tube type: Oral Tube size: 8.5 mm Number of attempts: 1 Airway Equipment and Method: Stylet Placement Confirmation: ETT inserted through vocal cords under direct vision, positive ETCO2 and breath sounds checked- equal and bilateral Secured at: 20 cm Tube secured with: Tape Dental Injury: Teeth and Oropharynx as per pre-operative assessment

## 2023-03-29 NOTE — Transfer of Care (Signed)
Immediate Anesthesia Transfer of Care Note  Patient: Virginia Powell  Procedure(s) Performed: ROBOTIC ASSISTED NAVIGATIONAL BRONCHOSCOPY BRONCHIAL NEEDLE ASPIRATION BIOPSIES BRONCHIAL BIOPSIES BRONCHIAL BRUSHINGS FIDUCIAL MARKER PLACEMENT BRONCHIAL WASHINGS  Patient Location: PACU  Anesthesia Type:General  Level of Consciousness: awake, alert , and oriented  Airway & Oxygen Therapy: Patient Spontanous Breathing  Post-op Assessment: Report given to RN and Post -op Vital signs reviewed and stable  Post vital signs: Reviewed and stable  Last Vitals:  Vitals Value Taken Time  BP 123/77 03/29/23 1430  Temp 36.4 C 03/29/23 1430  Pulse 106 03/29/23 1436  Resp 28 03/29/23 1436  SpO2 93 % 03/29/23 1436  Vitals shown include unfiled device data.  Last Pain:  Vitals:   03/29/23 1430  TempSrc: Temporal  PainSc: 0-No pain         Complications: No notable events documented.

## 2023-03-29 NOTE — Op Note (Signed)
 Video Bronchoscopy with Robotic Assisted Bronchoscopic Navigation   Date of Operation: 03/29/2023   Pre-op Diagnosis: Multiple bilateral pulmonary nodules, history of small cell lung cancer  Post-op Diagnosis: Same  Surgeon: Levy Pupa  Assistants: None  Anesthesia: General endotracheal anesthesia  Operation: Flexible video fiberoptic bronchoscopy with robotic assistance and biopsies.  Estimated Blood Loss: Minimal  Complications: None  Indications and History: Virginia Powell is a 73 y.o. female with history of small cell lung cancer that was diagnosed by nodal biopsies on bronchoscopy 09/2022.  At that time she also had transbronchial biopsies of bilateral pulmonary nodules that did not confirm evidence for malignancy, may have had granulomatous disease.  On surveillance imaging she now has progressive nodular disease.  Recommendation made to achieve a tissue diagnosis, culture data via robotic assisted navigational bronchoscopy. The risks, benefits, complications, treatment options and expected outcomes were discussed with the patient.  The possibilities of pneumothorax, pneumonia, reaction to medication, pulmonary aspiration, perforation of a viscus, bleeding, failure to diagnose a condition and creating a complication requiring transfusion or operation were discussed with the patient who freely signed the consent.    Description of Procedure: The patient was seen in the Preoperative Area, was examined and was deemed appropriate to proceed.  The patient was taken to [facility] endoscopy room 3, identified as Virginia Powell and the procedure verified as Flexible Video Fiberoptic Bronchoscopy.  A Time Out was held and the above information confirmed.   Prior to the date of the procedure a high-resolution CT scan of the chest was performed. Utilizing ION software program a virtual tracheobronchial tree was generated to allow the creation of distinct navigation pathways to the  patient's parenchymal abnormalities. After being taken to the operating room general anesthesia was initiated and the patient  was orally intubated. The video fiberoptic bronchoscope was introduced via the endotracheal tube and a general inspection was performed which showed normal right and left lung anatomy. Aspiration of the bilateral mainstems was completed to remove any remaining secretions. Robotic catheter inserted into patient's endotracheal tube.   Target #1 superior segment right lower lobe nodule (previously biopsied 09/2022): The distinct navigation pathways prepared prior to this procedure were then utilized to navigate to patient's lesion identified on CT scan. The robotic catheter was secured into place and the vision probe was withdrawn.  Lesion location was approximated using fluoroscopy.  Local registration and targeting was performed using Cios three-dimensional imaging. Under fluoroscopic guidance transbronchial needle brushings, transbronchial needle biopsies, and transbronchial forceps biopsies were performed to be sent for cytology and pathology.  Fiducial marker was already in place at this location from her original bronchoscopy.  A single transbronchial needle aspiration was performed under fluoroscopic guidance and sent for microbiology.  Target # 2 right upper lobe pulmonary nodule: The distinct navigation pathways prepared prior to this procedure were then utilized to navigate to patient's lesion identified on CT scan. The robotic catheter was secured into place and the vision probe was withdrawn.  Lesion location was approximated using fluoroscopy.  Local registration and targeting was performed using Cios three-dimensional imaging. Under fluoroscopic guidance transbronchial needle brushings, transbronchial needle biopsies, and transbronchial forceps biopsies were performed to be sent for cytology and pathology.  Under fluoroscopic guidance a single fiducial marker was placed  adjacent to the nodule  Target # 4 superior segment left lower lobe nodule (previously biopsied 09/2022): The distinct navigation pathways prepared prior to this procedure were then utilized to navigate to patient's lesion identified on CT  scan. The robotic catheter was secured into place and the vision probe was withdrawn.  Lesion location was approximated using fluoroscopy.  Local registration and targeting was performed using Cios three-dimensional imaging. Under fluoroscopic guidance transbronchial needle biopsies, and transbronchial forceps biopsies were performed to be sent for cytology and pathology.   Target # 5 superior segment left lower lobe nodule (new): The distinct navigation pathways prepared prior to this procedure were then utilized to navigate to patient's lesion identified on CT scan. The robotic catheter was secured into place and the vision probe was withdrawn.  Lesion location was approximated using fluoroscopy.  Local registration and targeting was performed using Cios three-dimensional imaging. Under fluoroscopic guidance transbronchial needle brushings, transbronchial needle biopsies, and transbronchial forceps biopsies were performed to be sent for cytology and pathology.  Under fluoroscopic guidance a single fiducial marker was placed adjacent to the nodule.  A bronchioalveolar lavage was performed in the left lower lobe adjacent to the nodule and sent for microbiology.  At the end of the procedure a general airway inspection was performed and there was no evidence of active bleeding. The bronchoscope was removed.  The patient tolerated the procedure well. There was no significant blood loss and there were no obvious complications. A post-procedural chest x-ray is pending.  Samples Target #1: 1. Transbronchial needle brushings from right lower lobe superior segment nodule 2. Transbronchial Wang needle biopsies from right lower lobe superior segment nodule 3. Transbronchial  forceps biopsies from right lower lobe superior segment nodule  Samples Target #2: 1. Transbronchial needle brushings from right upper lobe nodule 2. Transbronchial Wang needle biopsies from right upper lobe nodule 3. Transbronchial forceps biopsies from right upper lobe nodule  Samples Target #4: 1. Transbronchial Wang needle biopsies from left lower lobe superior segment nodule 2. Transbronchial forceps biopsies from left lower lobe superior segment nodule  Samples Target #5: 1. Transbronchial needle brushings from left lower lobe superior segment nodule 2. Transbronchial Wang needle biopsies from left lower lobe superior segment nodule 3. Transbronchial forceps biopsies from left lower lobe superior segment nodule 4. Bronchoalveolar lavage from left lower lobe superior segment   Plans:  The patient will be discharged from the PACU to home when recovered from anesthesia and after chest x-ray is reviewed. We will review the cytology, pathology and microbiology results with the patient when they become available. Outpatient followup will be with S. Groce and Dr. Delton Coombes.    Levy Pupa, MD, PhD 03/29/2023, 2:32 PM [facility] [phone] or if no answer before 7:00PM call [phone] For any issues after 7:00PM please call eLink [phone]

## 2023-03-29 NOTE — Discharge Instructions (Signed)
Flexible Bronchoscopy, Care After This sheet gives you information about how to care for yourself after your test. Your doctor may also give you more specific instructions. If you have problems or questions, contact your doctor. Follow these instructions at home: Eating and drinking When your numbness is gone and your cough and gag reflexes have come back, you may: Eat only soft foods. Slowly drink liquids. When you get home after the test, go back to your normal diet. Driving Do not drive for 24 hours if you were given a medicine to help you relax (sedative). Do not drive or use heavy machinery while taking prescription pain medicine. General instructions  Take over-the-counter and prescription medicines only as told by your doctor. Return to your normal activities as told. Ask what activities are safe for you. Do not use any products that have nicotine or tobacco in them. This includes cigarettes and e-cigarettes. If you need help quitting, ask your doctor. Keep all follow-up visits as told by your doctor. This is important. It is very important if you had a tissue sample (biopsy) taken. Get help right away if: You have shortness of breath that gets worse. You get light-headed. You feel like you are going to pass out (faint). You have chest pain. You cough up: More than a little blood. More blood than before. Summary Do not eat or drink anything (not even water) for 2 hours after your test, or until your numbing medicine wears off. Do not use cigarettes. Do not use e-cigarettes. Get help right away if you have chest pain.  Please call our office with any questions or concerns.  [phone].  This information is not intended to replace advice given to you by your health care provider. Make sure you discuss any questions you have with your health care provider. Document Released: 11/29/2008 Document Revised: 01/14/2017 Document Reviewed: 02/20/2016 Elsevier Patient Education  2020  [employer].

## 2023-03-29 NOTE — H&P (Signed)
Virginia Powell is an 73 y.o. female.   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  She also had pulmonary nodular disease with a predominant left lower lobe nodule that showed granulomatous inflammation.  She has been treated with chemotherapy, has some improvement in her mediastinal nodes but progression of pulmonary nodular disease, question etiology.  She presents today for repeat navigational bronchoscopy to evaluate these nodules.  Risks, benefits, rationale discussed with the patient. She feels well, has been dealing with some frequent bouts of bronchitis since I last saw her but none currently.  Stable shortness of breath  Past Medical History:  Diagnosis Date   Complication of anesthesia    COPD (chronic obstructive pulmonary disease) (HCC)    Dyspnea    Essential hypertension    Headache    Hyperlipidemia    Hypertension    Migraines    Pneumonia 04/2018   PONV (postoperative nausea and vomiting)    Psoriasis    Seasonal allergies    Smoker     Past Surgical History:  Procedure Laterality Date   ABDOMINAL HYSTERECTOMY     BRONCHIAL BIOPSY  10/04/2022   Procedure: BRONCHIAL BIOPSIES;  Surgeon: Leslye Peer, MD;  Location: [facility] ENDOSCOPY;  Service: Pulmonary;;   BRONCHIAL BRUSHINGS  10/04/2022   Procedure: BRONCHIAL BRUSHINGS;  Surgeon: Leslye Peer, MD;  Location: [facility] ENDOSCOPY;  Service: Pulmonary;;   BRONCHIAL NEEDLE ASPIRATION BIOPSY  10/04/2022   Procedure: BRONCHIAL NEEDLE ASPIRATION BIOPSIES;  Surgeon: Leslye Peer, MD;  Location: [facility] ENDOSCOPY;  Service: Pulmonary;;   BRONCHIAL WASHINGS  10/04/2022   Procedure: BRONCHIAL WASHINGS;  Surgeon: Leslye Peer, MD;  Location: [facility] ENDOSCOPY;  Service: Pulmonary;;   CATARACT EXTRACTION Bilateral    FIDUCIAL MARKER PLACEMENT  10/04/2022   Procedure: FIDUCIAL MARKER PLACEMENT;  Surgeon: Leslye Peer, MD;   Location: [facility] ENDOSCOPY;  Service: Pulmonary;;   IR IMAGING GUIDED PORT INSERTION  11/17/2022   PARTIAL HYSTERECTOMY     TUBAL LIGATION     VIDEO BRONCHOSCOPY WITH ENDOBRONCHIAL ULTRASOUND N/A 10/04/2022   Procedure: VIDEO BRONCHOSCOPY WITH ENDOBRONCHIAL ULTRASOUND;  Surgeon: Leslye Peer, MD;  Location: [facility] ENDOSCOPY;  Service: Pulmonary;  Laterality: N/A;    Family History  Problem Relation Age of Onset   Heart attack Brother    Hyperlipidemia Brother    Hyperlipidemia Mother    Social History:  reports that she has been smoking cigarettes. She has a 89.1 pack-year smoking history. She has never used smokeless tobacco. She reports that she does not currently use alcohol. She reports that she does not use drugs.  Allergies:  Allergies  Allergen Reactions   Morphine And Codeine Nausea And Vomiting    Medications Prior to Admission  Medication Sig Dispense Refill   albuterol (VENTOLIN HFA) 108 (90 Base) MCG/ACT inhaler SMARTSIG:2 Puff(s) By Mouth Every 4 Hours     atorvastatin (LIPITOR) 40 MG tablet Take 40 mg by mouth daily.     benzonatate (TESSALON) 100 MG capsule Take 100 mg by mouth 3 (three) times daily as needed for cough.     clindamycin (CLEOCIN) 300 MG capsule Take 300 mg by mouth 3 (three) times daily.     fexofenadine (ALLEGRA) 180 MG tablet Take 180 mg by mouth daily as needed for allergies or rhinitis.     fluticasone (FLONASE) 50 MCG/ACT nasal spray Place into both nostrils daily as needed.  hydrochlorothiazide (HYDRODIURIL) 12.5 MG tablet Take 12.5 mg by mouth daily.     hydrOXYzine (ATARAX) 25 MG tablet Take 1 tablet by mouth at bedtime as needed.     loratadine (CLARITIN) 10 MG tablet Take 10 mg by mouth daily as needed for allergies.     olmesartan (BENICAR) 40 MG tablet Take 1 tablet (40 mg total) by mouth daily. 30 tablet 0   sucralfate (CARAFATE) 1 GM/10ML suspension Take 10 mLs (1 g total) by mouth 4 (four) times daily -  with meals and at bedtime. 420 mL 0    TRELEGY ELLIPTA 100-62.5-25 MCG/ACT AEPB Inhale 1 puff into the lungs daily.     albuterol (PROVENTIL) (2.5 MG/3ML) 0.083% nebulizer solution SMARTSIG:1 Vial(s) Via Nebulizer Every 4-6 Hours PRN     azithromycin (ZITHROMAX) 500 MG tablet Take 500 mg by mouth daily.     CISPLATIN IV Inject into the vein every 21 ( twenty-one) days.     ETOPOSIDE IV Inject into the vein every 21 ( twenty-one) days.     furosemide (LASIX) 20 MG tablet Take 20 mg by mouth daily.     gabapentin (NEURONTIN) 300 MG capsule Take 600 mg by mouth at bedtime.     lidocaine-prilocaine (EMLA) cream Apply a quarter-sized amount to port a cath site and cover with plastic wrap 1 hour prior to infusion appointments 30 g 3   prochlorperazine (COMPAZINE) 10 MG tablet Take 1 tablet (10 mg total) by mouth every 6 (six) hours as needed. 60 tablet 2   rizatriptan (MAXALT-MLT) 10 MG disintegrating tablet Take 10 mg by mouth as needed for migraine. May repeat in 2 hours if needed      No results found for this or any previous visit (from the past 48 hours). No results found.  Review of Systems As per HPI Pulse (!) 104, temperature (!) 97.5 F (36.4 C), temperature source Oral, resp. rate 18, height 5\' 4"  (1.626 m), weight 45.8 kg. Physical Exam  Gen: Pleasant, well-nourished, in no distress,  normal affect  ENT: No lesions,  mouth clear,  oropharynx clear, no postnasal drip  Neck: No JVD, no stridor  Lungs: No use of accessory muscles, no crackles or wheezing on normal respiration, no wheeze on forced expiration  Cardiovascular: RRR, heart sounds normal, no murmur or gallops, no peripheral edema  Abdomen: soft and NT, no HSM,  BS normal  Musculoskeletal: No deformities, no cyanosis or clubbing  Neuro: alert, awake, non focal  Skin: Warm, no lesions or rashes    Assessment/Plan Abnormal CT scan of the chest in a patient with a history of limited stage small cell lung cancer that has been treated, bilateral  pulmonary nodules that are progressed.  She had previous nodular biopsy that showed granulomatous inflammation in the left lower lobe.  Plan to repeat her navigation, obtain tissue for cytology and also culture data.  No barriers identified.  Leslye Peer, MD 03/29/2023, 10:45 AM

## 2023-03-29 NOTE — Anesthesia Postprocedure Evaluation (Signed)
Anesthesia Post Note  Patient: Virginia Powell  Procedure(s) Performed: ROBOTIC ASSISTED NAVIGATIONAL BRONCHOSCOPY BRONCHIAL NEEDLE ASPIRATION BIOPSIES BRONCHIAL BIOPSIES BRONCHIAL BRUSHINGS FIDUCIAL MARKER PLACEMENT BRONCHIAL WASHINGS     Patient location during evaluation: PACU Anesthesia Type: General Level of consciousness: awake and alert Pain management: pain level controlled Vital Signs Assessment: post-procedure vital signs reviewed and stable Respiratory status: spontaneous breathing, nonlabored ventilation and respiratory function stable Cardiovascular status: blood pressure returned to baseline Postop Assessment: no apparent nausea or vomiting Anesthetic complications: no   No notable events documented.  Last Vitals:  Vitals:   03/29/23 1520 03/29/23 1530  BP: 117/74 106/72  Pulse: (!) 102 95  Resp: (!) 24 20  Temp:    SpO2: 92% 93%    Last Pain:  Vitals:   03/29/23 1530  TempSrc:   PainSc: 0-No pain   Pain Goal:                   Shanda Howells

## 2023-03-30 LAB — ACID FAST SMEAR (AFB, MYCOBACTERIA): Acid Fast Smear: NEGATIVE

## 2023-03-31 ENCOUNTER — Encounter (HOSPITAL_COMMUNITY): Payer: Self-pay | Admitting: Emergency Medicine

## 2023-03-31 ENCOUNTER — Encounter: Payer: Self-pay | Admitting: Oncology

## 2023-04-01 LAB — CULTURE, BAL-QUANTITATIVE W GRAM STAIN

## 2023-04-03 LAB — AEROBIC/ANAEROBIC CULTURE W GRAM STAIN (SURGICAL/DEEP WOUND): Culture: NORMAL

## 2023-04-04 LAB — CYTOLOGY - NON PAP

## 2023-04-05 ENCOUNTER — Telehealth: Payer: Self-pay | Admitting: Internal Medicine

## 2023-04-05 ENCOUNTER — Encounter: Payer: Self-pay | Admitting: Acute Care

## 2023-04-05 ENCOUNTER — Ambulatory Visit: Payer: HMO | Admitting: Acute Care

## 2023-04-05 VITALS — BP 116/76 | HR 114 | Temp 97.5°F | Ht 64.0 in | Wt 102.6 lb

## 2023-04-05 DIAGNOSIS — Z85118 Personal history of other malignant neoplasm of bronchus and lung: Secondary | ICD-10-CM | POA: Diagnosis not present

## 2023-04-05 DIAGNOSIS — Z9889 Other specified postprocedural states: Secondary | ICD-10-CM

## 2023-04-05 DIAGNOSIS — F17210 Nicotine dependence, cigarettes, uncomplicated: Secondary | ICD-10-CM | POA: Diagnosis not present

## 2023-04-05 DIAGNOSIS — Z923 Personal history of irradiation: Secondary | ICD-10-CM | POA: Diagnosis not present

## 2023-04-05 DIAGNOSIS — Z9221 Personal history of antineoplastic chemotherapy: Secondary | ICD-10-CM | POA: Diagnosis not present

## 2023-04-05 DIAGNOSIS — A498 Other bacterial infections of unspecified site: Secondary | ICD-10-CM

## 2023-04-05 DIAGNOSIS — F17200 Nicotine dependence, unspecified, uncomplicated: Secondary | ICD-10-CM

## 2023-04-05 MED ORDER — CIPROFLOXACIN HCL 500 MG PO TABS: 500.0000 mg | ORAL_TABLET | Freq: Two times a day (BID) | ORAL | 0 refills | Status: DC

## 2023-04-05 NOTE — Progress Notes (Signed)
 History of Present Illness Virginia Powell is a 73 y.o. female current every day smoker followed through the lung cancer screening program. Her baseline scan done 08/04/2022 was read as a LR 4B.  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  Post chemo and radiation treatment. She was rereferred for suspected progression of disease despite treatment in February 2025.  04/05/2023 Pt.presents for follow up after bronchoscopy with biopsies.  Patient states she has done very well after the procedure.  No bleeding, fever, however, + purulent secretions, no adverse reaction to anesthesia.    Patient was last seen in this office 11/09/2022 and was newly diagnosed with stage IV small cell lung cancer of the 11R node at that time.  She was treated at [facility] by Dr. Anders Simmonds.  She is status post chemo, RT ,  (Cis/Etop) completed on 02/04/23. Treatment response scan 02/17/2023 was consistent with progression of disease, as there were several new pulmonary nodules in the right upper lobe,  1 measuring 7 mm, the other measuring 12 mm, and a third measuring 23 mm.  Previously placed fiducial markers were not seen.  Additionally there was a new left upper lobe pulmonary nodule that measured 9 mm, and a previously described left lower lobe nodule that was 20 mm.  There were several additional left lobe nodules that were new some with spiculated characteristics. Patient case was discussed in tumor board and the consensus was to treat this patient like a limited stage small cell lung cancer with chemo and radiation therapy again.  MRI was ordered to rule out brain metastasis.  Patient was referred to pulmonary for biopsy of the new 2 cm left lower lobe lung nodule to confirm disease progression.  Patient was also referred to Dr. Eloise Harman at [facility] for evaluation and a clinical trial or alternative treatment options.(Lubrinectidin or Tarlatamab).  Oncology were considering- interim treatment with  Lubrinectidin if there were  delays in starting new treatment plan.  We have gone over the patient's biopsy results.  The right lower lobe target was positive for atypical cells.  Pathology did specify that with the knowledge that the patient had small cell lung cancer in her 11R lymph node the current specimen do not show any evidence of small cell cancer,or definitive evidence of neoplasm or malignancy. Likewise the right upper lobe targets were read as atypical cells, as were the left lower lobe targets.  We reviewed the results of her micro and cultures.  Culture was positive for Pseudomonas.  I will treat her with Cipro.  I have asked the patient to take probiotic with antibiotic during treatment and for 5 days posttreatment.  She understands if she were to develop any diarrhea that does not resolve she is to stop the medication and call for alternate treatment. Additionally I have asked her to be aware of any tendon pain, or inflammation.  In which case she is to stop taking the medication and call immediately for an alternate treatment.  Patient states she has been called by [facility], acknowledging receipt of referral,  however she has not been given a date for consult.  She also does not have follow-up currently with Dr. Anders Simmonds. I have reminded both the patient and her husband to call [facility] to expedite her consultation with the oncologist there.  She verbalized understanding and stated that she would give them a call today.  Patient does continue to smoke cigarettes.  I have counseled her extensively on smoking  cessation.  We discussed that smoking can foster progression of cancer in addition to inflammation which can make chemotherapy and radiation more difficult. I have provided her with tools to help make smoking cessation successful.   Test Results: A. LUNG, RLL TARGET 1, FINE NEEDLE ASPIRATION: - Atypical cells present - See comment  B. LUNG, RLL TARGET 1, BRUSHING: - Atypical cells  present - See comment  D. LUNG, RUL TARGET 2, FINE NEEDLE ASPIRATION: - Atypical cells present - See comment  E. LUNG, RUL TARGET 2, BRUSHING: - No malignant cells identified  COMMENT:  The specimens contain pulmonary parenchyma with chronic inflammation, focal and mild interstitial fibrosis and atypia cell characterized by frequent binucleation. Immunohistochemical stains for CMV were performed on multiple parts and do not show evidence of cytomegaloviral inclusions. We favor the atypical cells are reactive pneumocytes in nature. The patients history of small cell carcinoma in a lymph node sampling is noted. The current specimens do not show evidence of small cell carcinoma or definitive evidence of neoplasm or malignancy. Clinical and radiological correlation is recommended.   F. LUNG, LLL SUPERIOR SEGMENT TARGET 4, FINE NEEDLE ASPIRATION:  - Atypical cells present   G. LUNG, LLL SUPERIOR SEGMENT TARGET 5, FINE NEEDLE ASPIRATION:  - Atypical cells present   H. LUNG, LLL SUPERIOR SEGMENT TARGET 5, BRUSHING:  - Atypical cells present   Microbiology 03/29/2023 Negative for fungal Negative for AFB BAL normal flora Rare consistent with normal resp. Flora Pseudomonas in Culture.  >> Started on Cipro>> With probiotic>> warned of potential of tendon rupture.Told to stop taking is any issues with tendon pain  10/04/2022>> Previous biopsy A: Lymph node, 11R, fine needle aspiration - Positive for malignancy - Small cell carcinoma (see comment)      Latest Ref Rng & Units 03/10/2023    2:20 PM 01/31/2023    7:59 AM 01/10/2023    7:59 AM  CBC  WBC 4.0 - 10.5 K/uL 8.5  9.2  13.3   Hemoglobin 12.0 - 15.0 g/dL 9.7  7.7  9.1   Hematocrit 36.0 - 46.0 % 29.7  24.7  27.1   Platelets 150 - 400 K/uL 481  371  498        Latest Ref Rng & Units 03/10/2023    2:20 PM 01/31/2023    7:59 AM 01/10/2023    7:59 AM  BMP  Glucose 70 - 99 mg/dL 97  161  096   BUN 8 - 23 mg/dL 30  14  31     Creatinine 0.44 - 1.00 mg/dL 0.45  4.09  8.11   Sodium 135 - 145 mmol/L 139  135  132   Potassium 3.5 - 5.1 mmol/L 4.0  3.9  4.0   Chloride 98 - 111 mmol/L 104  104  103   CO2 22 - 32 mmol/L 25  20  20    Calcium 8.9 - 10.3 mg/dL 9.5  8.8  9.0     BNP No results found for: "BNP"  ProBNP No results found for: "PROBNP"  PFT    Component Value Date/Time   FEV1PRE 1.22 03/09/2022 1445   FEV1POST 1.20 03/09/2022 1445   FVCPRE 2.35 03/09/2022 1445   FVCPOST 2.28 03/09/2022 1445   TLC 5.63 03/09/2022 1445   DLCOUNC 10.30 03/09/2022 1445   PREFEV1FVCRT 52 03/09/2022 1445   PSTFEV1FVCRT 53 03/09/2022 1445    MR Brain W Wo Contrast Result Date: 03/30/2023 CLINICAL DATA:  Small-cell lung cancer, monitor EXAM: MRI HEAD WITHOUT AND  WITH CONTRAST TECHNIQUE: Multiplanar, multiecho pulse sequences of the brain and surrounding structures were obtained without and with intravenous contrast. CONTRAST:  5mL GADAVIST GADOBUTROL 1 MMOL/ML IV SOLN COMPARISON:  11/22/2022 FINDINGS: Brain: There is no acute infarction or intracranial hemorrhage. There is no intracranial mass, mass effect, or edema. There is no hydrocephalus or extra-axial fluid collection. Ventricles and sulci are normal in size and configuration. Patchy T2 hyperintensity in the supratentorial and pontine white matter probably reflects stable mild chronic microvascular ischemic changes. A punctate focus of susceptibility is again noted within the left temporal lobe, which may reflect chronic blood products or mineralization. No abnormal enhancement. Vascular: Major vessel flow voids at the skull base are preserved. Skull and upper cervical spine: Normal marrow signal is preserved. Sinuses/Orbits: Paranasal sinuses are aerated. Orbits are unremarkable. Other: Sella is unremarkable. Mastoid air cells are clear. Mildly T2 hyperintense 1.6 cm left infra-auricular lesion with enhancement at the periphery (series 10, image 3) has increased in size  (previously 1 cm). IMPRESSION: No evidence of intracranial metastatic disease. Increase in size of a left infra-auricular lesion involving or superficial to the left parotid. Notably, no increased uptake in this region on October 2024 PET CT Electronically Signed   By: Guadlupe Spanish M.D.   On: 03/30/2023 10:18   DG Chest Port 1 View Result Date: 03/29/2023 CLINICAL DATA:  Status post bronchoscopy. EXAM: PORTABLE CHEST 1 VIEW COMPARISON:  X-ray 10/04/2022. chest CT scan 02/17/2023 FINDINGS: Hyperinflation. Basilar scar atelectasis. No consolidation, pneumothorax, effusion or edema. Normal cardiopericardial silhouette. Calcified aorta. Surgical changes overlie the central chest. Right IJ chest port with tip overlying the central SVC. Fullness of the right lung hilum is decreasing. Please correlate with history. IMPRESSION: No pneumothorax or effusion post bronchoscopy. Hyperinflation with basilar atelectasis. Chest port. Decreasing fullness of the right lung hilum compared to prior x-ray. History of known small-cell lung cancer with lung metastases. Please correlate with recent chest CT of 02/17/2023 Electronically Signed   By: Karen Kays M.D.   On: 03/29/2023 16:34   DG C-ARM BRONCHOSCOPY Result Date: 03/29/2023 C-ARM BRONCHOSCOPY: Fluoroscopy was utilized by the requesting physician.  No radiographic interpretation.   DG C-Arm 1-60 Min-No Report Result Date: 03/29/2023 Fluoroscopy was utilized by the requesting physician.  No radiographic interpretation.     Past medical hx Past Medical History:  Diagnosis Date   Complication of anesthesia    COPD (chronic obstructive pulmonary disease) (HCC)    Dyspnea    Essential hypertension    Headache    Hyperlipidemia    Hypertension    Migraines    Pneumonia 04/2018   PONV (postoperative nausea and vomiting)    Psoriasis    Seasonal allergies    Smoker      Social History   Tobacco Use   Smoking status: Some Days    Current packs/day:  1.62    Average packs/day: 1.6 packs/day for 55.0 years (89.1 ttl pk-yrs)    Types: Cigarettes   Smokeless tobacco: Never   Tobacco comments:    Smoking 1-2 puffs.  Went 8 days without smoking.  Has not bought anymore.  11/09/2022 hfb  Vaping Use   Vaping status: Never Used  Substance Use Topics   Alcohol use: Not Currently    Comment: social   Drug use: Never    Ms.Blick reports that she has been smoking cigarettes. She has a 89.1 pack-year smoking history. She has never used smokeless tobacco. She reports that she does not currently use  alcohol. She reports that she does not use drugs.  Tobacco Cessation: Ready to quit: Not Answered Counseling given: Not Answered Tobacco comments: Smoking 1-2 puffs.  Went 8 days without smoking.  Has not bought anymore.  11/09/2022 hfb Patient was counseled for 3 to 4 minutes on smoking cessation.  She has been provided with tools to help with cessation see AVS  Patient has a 90-pack-year smoking history  Past surgical hx, Family hx, Social hx all reviewed.  Current Outpatient Medications on File Prior to Visit  Medication Sig   albuterol (PROVENTIL) (2.5 MG/3ML) 0.083% nebulizer solution SMARTSIG:1 Vial(s) Via Nebulizer Every 4-6 Hours PRN   albuterol (VENTOLIN HFA) 108 (90 Base) MCG/ACT inhaler SMARTSIG:2 Puff(s) By Mouth Every 4 Hours   atorvastatin (LIPITOR) 40 MG tablet Take 40 mg by mouth daily.   benzonatate (TESSALON) 100 MG capsule Take 100 mg by mouth 3 (three) times daily as needed for cough.   CISPLATIN IV Inject into the vein every 21 ( twenty-one) days.   clindamycin (CLEOCIN) 300 MG capsule Take 300 mg by mouth 3 (three) times daily.   fexofenadine (ALLEGRA) 180 MG tablet Take 180 mg by mouth daily as needed for allergies or rhinitis.   fluticasone (FLONASE) 50 MCG/ACT nasal spray Place into both nostrils daily as needed.   hydrochlorothiazide (HYDRODIURIL) 12.5 MG tablet Take 12.5 mg by mouth daily.   hydrOXYzine (ATARAX) 25 MG  tablet Take 1 tablet by mouth at bedtime as needed.   lidocaine-prilocaine (EMLA) cream Apply a quarter-sized amount to port a cath site and cover with plastic wrap 1 hour prior to infusion appointments   loratadine (CLARITIN) 10 MG tablet Take 10 mg by mouth daily as needed for allergies.   olmesartan (BENICAR) 40 MG tablet Take 1 tablet (40 mg total) by mouth daily.   rizatriptan (MAXALT-MLT) 10 MG disintegrating tablet Take 10 mg by mouth as needed for migraine. May repeat in 2 hours if needed   sucralfate (CARAFATE) 1 GM/10ML suspension Take 10 mLs (1 g total) by mouth 4 (four) times daily -  with meals and at bedtime.   TRELEGY ELLIPTA 100-62.5-25 MCG/ACT AEPB Inhale 1 puff into the lungs daily.   ETOPOSIDE IV Inject into the vein every 21 ( twenty-one) days. (Patient not taking: Reported on 04/05/2023)   No current facility-administered medications on file prior to visit.     Allergies  Allergen Reactions   Morphine And Codeine Nausea And Vomiting    Review Of Systems:  Constitutional:   No  weight loss, night sweats,  Fevers, chills, fatigue, or  lassitude.  HEENT:   No headaches,  Difficulty swallowing,  Tooth/dental problems, or  Sore throat,                No sneezing, itching, ear ache, nasal congestion, post nasal drip,   CV:  No chest pain,  Orthopnea, PND, swelling in lower extremities, anasarca, dizziness, palpitations, syncope.   GI  No heartburn, indigestion, abdominal pain, nausea, vomiting, diarrhea, change in bowel habits, loss of appetite, bloody stools.   Resp: No shortness of breath with exertion or at rest.  No excess mucus, no productive cough,  No non-productive cough,  No coughing up of blood.  No change in color of mucus.  No wheezing.  No chest wall deformity  Skin: no rash or lesions.  GU: no dysuria, change in color of urine, no urgency or frequency.  No flank pain, no hematuria   MS:  No joint pain  or swelling.  No decreased range of motion.  No back  pain.  Psych:  No change in mood or affect. No depression or anxiety.  No memory loss.   Vital Signs BP 116/76 (BP Location: Left Arm, Patient Position: Sitting, Cuff Size: Normal)   Pulse (!) 114   Temp (!) 97.5 F (36.4 C) (Temporal)   Ht 5\' 4"  (1.626 m)   Wt 102 lb 9.6 oz (46.5 kg)   SpO2 94%   BMI 17.61 kg/m    Physical Exam:  General- No distress,  A&Ox3, pleasant ENT: No sinus tenderness, TM clear, pale nasal mucosa, no oral exudate,no post nasal drip, no LAN Cardiac: S1, S2, regular rate and rhythm, no murmur Chest: No wheeze/ rales/ dullness; no accessory muscle use, no nasal flaring, no sternal retractions, few rhonchi per bases Abd.: Soft Non-tender, nondistended, bowel sounds positive,Body mass index is 17.61 kg/m.  Ext: No clubbing cyanosis, edema, no obvious deformities Neuro:  normal strength, moving all extremities x 4, alert and oriented x 3, appropriate Skin: No rashes, warm and dry, no obvious lesions Psych: normal mood and behavior   Assessment/Plan Small Cell Lung Cancer History of treatment completed in December 2024. Recent imaging showed progression of disease. Biopsy of new left lower lung nodule showed atypical cells, but no definitive evidence of malignancy. Referral to [facility] oncology for further evaluation and treatment planning. -Continue follow-up with [facility] oncology for treatment planning.  Pseudomonas Lung Infection Culture from recent biopsy showed Pseudomonas. -Start Ciprofloxacin 500mg  twice daily for 10 days. -Take Activia yogurt twice daily while on Ciprofloxacin and for 5 days after. -If diarrhea develops, stop Ciprofloxacin and contact the office for alternative treatment. -If tendon pain, swelling, or inflammation develops, stop Ciprofloxacin and contact the office.  Tobacco Use Intermittent smoking reported. Discussed options for nicotine replacement therapy. -Consider nicotine replacement therapy (patches or mints) for times of urge to  smoke. -Continue efforts to quit smoking. -See AVS for tools to enable successful cessation.  Follow-up Monitor symptoms and report any new or worsening symptoms to the office.  Follow-up with [facility] oncology for further treatment planning. Call us if you need [place]  I spent 30 minutes dedicated to the care of this patient on the date of this encounter to include pre-visit review of records, face-to-face time with the patient discussing conditions above, post visit ordering of testing, clinical documentation with the electronic health record, making appropriate referrals as documented, and communicating necessary information to the patient's healthcare team.   Bevelyn Ngo, NP 04/05/2023  10:28 AM

## 2023-04-05 NOTE — Telephone Encounter (Signed)
 [facility] [place] Clinical Research Coordinator note:   This visit for Subject Virginia Powell with DOB: [id number] on 04/05/2023. Subject contacted regarding [id number] to inform that Principal Investigator has changed to Dr. Delton Coombes. Voicemail left requesting a return call.

## 2023-04-05 NOTE — Patient Instructions (Addendum)
 It is good to see you today. I am glad you have done well after your procedure. The biopsies show atypical cells, which are not normal cells, but they are not malignant per the pathologist. You have been referred for [facility] oncology for treatment. You should get a call to get this appointment scheduled.  One of your respiratory cultures have grown out pseudomonas. I have send in a prescription for Cipro to treat this.  Take one tablet twice daily. You must eat 2 Activia yogurts a day while on Cipro, and for 5 days after antibiotic is complete. If you develop diarrhea stop the medication and let us know so we can choose another option for treatment. If you develop tendon pain, stop the medication and call us so we can choose another option for treatment. Call if you have any questions. Call if you need [place]. Please contact office for sooner follow up if symptoms do not improve or worsen or seek emergency care    You can receive free nicotine replacement therapy (patches, gum, or mints) by calling 1-800-QUIT NOW. Please call so we can get you on the path to becoming a non-smoker. I know it is hard, but you can do this!  Hypnosis for smoking cessation  [employer] [phone]  Acupuncture for smoking cessation  [employer] [phone]

## 2023-04-06 ENCOUNTER — Other Ambulatory Visit: Payer: Self-pay

## 2023-04-07 ENCOUNTER — Other Ambulatory Visit: Payer: Self-pay | Admitting: Oncology

## 2023-04-07 ENCOUNTER — Other Ambulatory Visit: Payer: Self-pay

## 2023-04-07 DIAGNOSIS — C3430 Malignant neoplasm of lower lobe, unspecified bronchus or lung: Secondary | ICD-10-CM

## 2023-04-08 ENCOUNTER — Other Ambulatory Visit: Payer: Self-pay

## 2023-04-09 ENCOUNTER — Encounter: Payer: Self-pay | Admitting: Oncology

## 2023-04-09 NOTE — Progress Notes (Signed)
 The proposed treatment discussed in conference is for discussion purpose only and is not a binding recommendation.  The patients have not been physically examined, or presented with their treatment options.  Therefore, final treatment plans cannot be decided.

## 2023-04-11 ENCOUNTER — Other Ambulatory Visit: Payer: HMO

## 2023-04-11 ENCOUNTER — Ambulatory Visit: Payer: HMO | Admitting: Oncology

## 2023-04-13 ENCOUNTER — Other Ambulatory Visit: Payer: Self-pay | Admitting: *Deleted

## 2023-04-13 ENCOUNTER — Inpatient Hospital Stay: Payer: HMO | Admitting: Oncology

## 2023-04-13 ENCOUNTER — Inpatient Hospital Stay: Payer: HMO | Attending: Oncology

## 2023-04-13 VITALS — BP 117/83 | HR 119 | Temp 98.3°F | Resp 20 | Wt 105.6 lb

## 2023-04-13 DIAGNOSIS — H6690 Otitis media, unspecified, unspecified ear: Secondary | ICD-10-CM | POA: Insufficient documentation

## 2023-04-13 DIAGNOSIS — C7802 Secondary malignant neoplasm of left lung: Secondary | ICD-10-CM

## 2023-04-13 DIAGNOSIS — A319 Mycobacterial infection, unspecified: Secondary | ICD-10-CM

## 2023-04-13 DIAGNOSIS — Z9221 Personal history of antineoplastic chemotherapy: Secondary | ICD-10-CM | POA: Diagnosis not present

## 2023-04-13 DIAGNOSIS — D6489 Other specified anemias: Secondary | ICD-10-CM | POA: Diagnosis not present

## 2023-04-13 DIAGNOSIS — C7801 Secondary malignant neoplasm of right lung: Secondary | ICD-10-CM

## 2023-04-13 DIAGNOSIS — D3501 Benign neoplasm of right adrenal gland: Secondary | ICD-10-CM | POA: Diagnosis not present

## 2023-04-13 DIAGNOSIS — D3502 Benign neoplasm of left adrenal gland: Secondary | ICD-10-CM | POA: Insufficient documentation

## 2023-04-13 DIAGNOSIS — F17210 Nicotine dependence, cigarettes, uncomplicated: Secondary | ICD-10-CM | POA: Insufficient documentation

## 2023-04-13 DIAGNOSIS — D649 Anemia, unspecified: Secondary | ICD-10-CM

## 2023-04-13 DIAGNOSIS — Z923 Personal history of irradiation: Secondary | ICD-10-CM | POA: Insufficient documentation

## 2023-04-13 LAB — CBC WITH DIFFERENTIAL/PLATELET
Abs Immature Granulocytes: 0.08 10*3/uL — ABNORMAL HIGH (ref 0.00–0.07)
Basophils Absolute: 0 10*3/uL (ref 0.0–0.1)
Basophils Relative: 0 %
Eosinophils Absolute: 0.1 10*3/uL (ref 0.0–0.5)
Eosinophils Relative: 1 %
HCT: 34.9 % — ABNORMAL LOW (ref 36.0–46.0)
Hemoglobin: 10.9 g/dL — ABNORMAL LOW (ref 12.0–15.0)
Immature Granulocytes: 1 %
Lymphocytes Relative: 12 %
Lymphs Abs: 1.2 10*3/uL (ref 0.7–4.0)
MCH: 31.3 pg (ref 26.0–34.0)
MCHC: 31.2 g/dL (ref 30.0–36.0)
MCV: 100.3 fL — ABNORMAL HIGH (ref 80.0–100.0)
Monocytes Absolute: 0.9 10*3/uL (ref 0.1–1.0)
Monocytes Relative: 9 %
Neutro Abs: 7.8 10*3/uL — ABNORMAL HIGH (ref 1.7–7.7)
Neutrophils Relative %: 77 %
Platelets: 553 10*3/uL — ABNORMAL HIGH (ref 150–400)
RBC: 3.48 MIL/uL — ABNORMAL LOW (ref 3.87–5.11)
RDW: 14.4 % (ref 11.5–15.5)
WBC: 10 10*3/uL (ref 4.0–10.5)
nRBC: 0 % (ref 0.0–0.2)

## 2023-04-13 LAB — COMPREHENSIVE METABOLIC PANEL
ALT: 18 U/L (ref 0–44)
AST: 16 U/L (ref 15–41)
Albumin: 3.5 g/dL (ref 3.5–5.0)
Alkaline Phosphatase: 62 U/L (ref 38–126)
Anion gap: 11 (ref 5–15)
BUN: 26 mg/dL — ABNORMAL HIGH (ref 8–23)
CO2: 21 mmol/L — ABNORMAL LOW (ref 22–32)
Calcium: 9.3 mg/dL (ref 8.9–10.3)
Chloride: 106 mmol/L (ref 98–111)
Creatinine, Ser: 0.95 mg/dL (ref 0.44–1.00)
GFR, Estimated: >60 mL/min (ref >60)
Glucose, Bld: 94 mg/dL (ref 70–99)
Potassium: 4.1 mmol/L (ref 3.5–5.1)
Sodium: 138 mmol/L (ref 135–145)
Total Bilirubin: 0.3 mg/dL (ref 0.0–1.2)
Total Protein: 7 g/dL (ref 6.5–8.1)

## 2023-04-13 LAB — MAGNESIUM: Magnesium: 2 mg/dL (ref 1.7–2.4)

## 2023-04-13 LAB — IRON AND TIBC
Iron: 56 ug/dL (ref 28–170)
Saturation Ratios: 17 % (ref 10.4–31.8)
TIBC: 325 ug/dL (ref 250–450)
UIBC: 269 ug/dL

## 2023-04-13 LAB — FERRITIN: Ferritin: 103 ng/mL (ref 11–307)

## 2023-04-13 MED ORDER — HEPARIN SOD (PORK) LOCK FLUSH 100 UNIT/ML IV SOLN
500.0000 [IU] | Freq: Once | INTRAVENOUS | Status: AC
  Administered 2023-04-13: 500 [IU] via INTRAVENOUS

## 2023-04-13 MED ORDER — SODIUM CHLORIDE 0.9% FLUSH
10.0000 mL | Freq: Once | INTRAVENOUS | Status: AC
  Administered 2023-04-13: 10 mL via INTRAVENOUS

## 2023-04-13 NOTE — Patient Instructions (Signed)
 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 We also addressed your ongoing ear infection and your upcoming immunotherapy for cancer. Additionally, we talked about your progress with smoking cessation and healthy eating.  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 This type of infection requires specific antibiotics. We will refer you to an Infectious Disease specialist for further management. In the meantime, continue your current antibiotic until you receive further instructions from the specialist.  -EAR INFECTION: You have a chronic ear infection that is currently causing increased drainage and discomfort. We will refer you to an ENT specialist for further evaluation and management.  -CANCER: You are scheduled to start immunotherapy next week to treat your cancer. Immunotherapy helps your immune system fight cancer. We discussed potential side effects, including rash, thyroid problems, diarrhea, and difficulty breathing. This treatment will begin once we get clearance from the Infectious Disease specialist.  -GENERAL HEALTH: You have made progress in reducing smoking and improving your diet. Continue your efforts to quit smoking and maintain healthy eating habits.  INSTRUCTIONS:  1. Continue your current antibiotic for the lung infection until you hear from the Infectious Disease specialist. 2. Schedule an appointment with an ENT specialist for your ear infection. 3. Plan to start immunotherapy next week, pending clearance from the Infectious Disease specialist.

## 2023-04-14 ENCOUNTER — Other Ambulatory Visit: Payer: Self-pay

## 2023-04-14 ENCOUNTER — Encounter: Payer: Self-pay | Admitting: Oncology

## 2023-04-14 DIAGNOSIS — A319 Mycobacterial infection, unspecified: Secondary | ICD-10-CM | POA: Insufficient documentation

## 2023-04-14 DIAGNOSIS — H6690 Otitis media, unspecified, unspecified ear: Secondary | ICD-10-CM | POA: Insufficient documentation

## 2023-04-14 NOTE — Assessment & Plan Note (Signed)
 Chronic ear infection, previously treated with antibiotics. Currently experiencing increased drainage externally and discomfort. -Refer to ENT specialist for further evaluation and management.

## 2023-04-14 NOTE — Progress Notes (Addendum)
 Patient Care Team: Lianne Moris, PA-C as PCP - General (Family Medicine) Wendall Stade, MD as PCP - Cardiology (Cardiology) Cindie Crumbly, MD as Medical Oncologist (Medical Oncology) Therese Sarah, RN as Oncology Nurse Navigator (Medical Oncology)  Clinic Day:  04/14/2023  Referring physician: Lianne Moris, PA-C   CHIEF COMPLAINT:  CC: Stage IVA Small cell lug carcinoma    ASSESSMENT & PLAN:   Assessment & Plan: Virginia Powell  is a 73 y.o. 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 Treatment response scan showed new lung nodules.  Patient was evaluated for clinical trial at [facility] (Dr.Patel) prior to the biopsy.  Bronchoscopy with biopsy of the new lung lesions showed atypical cells and no evidence of malignancy.  AFB stain grew acid-fast bacilli, speciation is pending at this time.  -Discussed the patient's case at lung tumor board at diagnosis, consensus to treat it like a limited stage small cell lung cancer with chemoRT -Discussed starting durvalumab immunotherapy.  Common adverse effects, risk versus benefits discussed in detail. -Will await on ID evaluation for possible Mycobacterium treatment after speciation is available. Plan to start immunotherapy next week, pending clearance from Infectious Disease specialist. -Will obtain a new baseline CT chest before starting immunotherapy  Return to clinic in 2 weeks to discuss further management.  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  Speciation not available at this time. -Will refer to infectious diseases for management of this and to monitor closely especially when restart immunotherapy.  Smokes cigarettes -Patient is an avid smoker with history  of smoking 2 packs/day until recently -Patient continues to smoke -Discussed the risks of smoking including faster progression of cancer, inflammation causing difficulties with chemotherapy and radiation -Offered help to quit smoking, patient is reluctant at this time but stated that she has cut down a lot  Ear infection Chronic ear infection, previously treated with antibiotics. Currently experiencing increased drainage externally and discomfort. -Refer to ENT specialist for further evaluation and management.  Anemia Likely secondary to chemotherapy.  Iron panel and ferritin within normal limits.  TSAT is slightly low. -Recommended to eat a diet rich in protein and  green leafy vegetables   The patient understands the plans discussed today and is in agreement with them.  She knows to contact our office if she develops concerns prior to her next appointment.  I provided 30 minutes of face-to-face time during this encounter and > 50% was spent counseling as documented under my assessment and plan.    Cindie Crumbly, MD  [facility] 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 Growing solid nodule of the right lower lobe measuring 8 mm and new irregular solid nodule of the left lower lobe measuring 9.3 mm. Lung-RADS 4B, suspicious. Additional imaging evaluation or consultation with Pulmonology or Thoracic Surgery recommended.   09/09/2022 PET scan   IMPRESSION: 1. Hypermetabolic right hilar adenopathy and bilateral pulmonary nodules, findings indicative of synchronous primary bronchogenic carcinomas. No evidence of distant metastatic disease. 2. Vague slight thickening of lateral breast soft tissue  with metabolism just below blood  pool. Consider mammographic correlation, as clinically indicated. 3. Slight marginal irregularity of the liver raises suspicion for cirrhosis. 4. Bilateral adrenal adenomas.   10/02/2022 Imaging   CT chest without contrast: IMPRESSION: 1. No significant change in the appearance of tracer avid nodules within the medial right lower lobe and superior segment of left lower lobe. 2. Stable appearance of tracer avid nodal conglomeration noted within the right hilum. Which is concerning for nodal metastasis. 3. Stable appearance of bilateral adrenal adenomas. No follow-up imaging recommended.   10/04/2022 Pathology Results   A. LUNG, LLL, FINE NEEDLE ASPIRATION:  - No malignant cells identified  - Granulomatous inflammation   B. LUNG, LLL, BRUSHING:  - No malignant cells identified  - Granulomatous inflammation D. LUNG, RLL, FINE NEEDLE ASPIRATION:  - No malignant cells identified   E. 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 Interval enlargement of the superior segment left lower lobe lesion with stable hypermetabolism. 2. Stable 9 mm medial right lower lobe pulmonary nodule with slightly increased SUV max. 3. Persistent hypermetabolic right hilar adenopathy. 4. New 10 mm sub solid lesion in the left lower lobe with SUV max of 2.14. This is likely inflammatory. Attention on future studies is suggested. 5. Stable 14 mm linear density in the right lateral breast with low level FDG uptake. An indolent breast cancer is possible. 6. No findings for abdominal/pelvic metastatic disease or osseous metastatic disease. 7. Stable benign bilateral adrenal gland adenomas.   11/22/2022 Imaging   MRI Brain: No evidence of intracranial metastatic disease.     11/30/2022 - 02/04/2023 Chemotherapy   Patient is on Treatment Plan : LUNG  SMALL CELL Cisplatin (80) D1 + Etoposide (100) D1-3 q21d      12/13/2022 - 01/07/2023 Radiation Therapy   RT with Dr[facility]Morris at [facility], [facility]   02/17/2023 Imaging   CT CAP: IMPRESSION: CHEST:   1. New bilateral pulmonary nodules. 2. Interval decrease in size of RIGHT hilar lymph nodes. 3. Decrease in size of previously described pulmonary nodules.   PELVIS:   1. No evidence of metastatic disease in the abdomen pelvis. 2. Stable benign adrenal adenomas. 3. Sigmoid diverticulosis without diverticulitis.   03/17/2023 Imaging   MRI brain:  No evidence of metastatic disease.   03/29/2023 Procedure   Bronchoscopy with biopsy:  Pathology: Atypical cells AFB culture: Positive for Mycobacterium       Current Treatment: Completed Chemo RT with Cisplatin + Etoposide. Awaiting starting Durvalumab  INTERVAL HISTORY:  Marisel Ashley Royalty Axe is here today for follow up.  She is accompanied by her husband today .  Patient reports feeling well and is gaining weight.  She reports taking multiple yogurts every day and that has helped with gaining weight.  She reports that since the bronchoscopy she has been on antibiotics for lung infection.  She also mentions an external ear infection that has significant drainage recently.  She has no other complaints at this time.  Overall she feels significantly better than while she was getting chemotherapy.  We discussed that her recent biopsy did not show malignancy and would like to start on immunotherapy.  But unfortunately, the AFB culture showed acid-fast bacilli which does not have speciation yet.  Discussed with patient that she might need long-term antibiotics for possible Mycobacterium  infection but also would have to discuss with ID to see if it is okay to give immunotherapy prior to starting treatment for Mycobacterium.  Patient is in agreement to see ID specialist in [place].  Patient continues to smoke but has been using mints to help with  quitting.   I have reviewed the past medical history, past surgical history, social history and family history with the patient and they are unchanged from previous note.  ALLERGIES:  is allergic to morphine and codeine.  MEDICATIONS:  Current Outpatient Medications  Medication Sig Dispense Refill   albuterol (PROVENTIL) (2.5 MG/3ML) 0.083% nebulizer solution SMARTSIG:1 Vial(s) Via Nebulizer Every 4-6 Hours PRN     albuterol (VENTOLIN HFA) 108 (90 Base) MCG/ACT inhaler SMARTSIG:2 Puff(s) By Mouth Every 4 Hours     atorvastatin (LIPITOR) 40 MG tablet Take 40 mg by mouth daily.     benzonatate (TESSALON) 100 MG capsule Take 100 mg by mouth 3 (three) times daily as needed for cough.     ciprofloxacin (CIPRO) 500 MG tablet Take 1 tablet (500 mg total) by mouth 2 (two) times daily. 20 tablet 0   CISPLATIN IV Inject into the vein every 21 ( twenty-one) days.     clindamycin (CLEOCIN) 300 MG capsule Take 300 mg by mouth 3 (three) times daily.     ETOPOSIDE IV Inject into the vein every 21 ( twenty-one) days.     fexofenadine (ALLEGRA) 180 MG tablet Take 180 mg by mouth daily as needed for allergies or rhinitis.     fluticasone (FLONASE) 50 MCG/ACT nasal spray Place into both nostrils daily as needed.     hydrochlorothiazide (HYDRODIURIL) 12.5 MG tablet Take 12.5 mg by mouth daily.     hydrOXYzine (ATARAX) 25 MG tablet Take 1 tablet by mouth at bedtime as needed.     lidocaine-prilocaine (EMLA) cream Apply a quarter-sized amount to port a cath site and cover with plastic wrap 1 hour prior to infusion appointments 30 g 3   loratadine (CLARITIN) 10 MG tablet Take 10 mg by mouth daily as needed for allergies.     olmesartan (BENICAR) 40 MG tablet Take 1 tablet (40 mg total) by mouth daily. 30 tablet 0   rizatriptan (MAXALT-MLT) 10 MG disintegrating tablet Take 10 mg by mouth as needed for migraine. May repeat in 2 hours if needed     sucralfate (CARAFATE) 1 GM/10ML suspension Take 10 mLs (1 g total)  by mouth 4 (four) times daily -  with meals and at bedtime. 420 mL 0   TRELEGY ELLIPTA 100-62.5-25 MCG/ACT AEPB Inhale 1 puff into the lungs daily.     No current facility-administered medications for this visit.     REVIEW OF SYSTEMS:   Constitutional: Denies fevers, chills or abnormal weight loss Eyes: Denies blurriness of vision Ears, nose, mouth, throat, and face: Denies mucositis or sore throat Respiratory: Denies cough, dyspnea or wheezes Cardiovascular: Denies palpitation, chest discomfort or lower extremity swelling Gastrointestinal:  Denies nausea, heartburn or change in bowel habits Skin: Denies abnormal skin rashes Lymphatics: Denies new lymphadenopathy or easy bruising Neurological:Denies numbness, tingling or new weaknesses Behavioral/Psych: Mood is stable, no new changes  All other systems were reviewed with the patient and are negative.   VITALS:  Blood pressure 117/83, pulse (!) 119, temperature 98.3 F (36.8 C), temperature source Tympanic, resp. rate 20, weight 105 lb 9.6 oz (47.9 kg), SpO2 97%.  Wt Readings from Last 3 Encounters:  04/13/23 105 lb 9.6 oz (47.9 kg)  04/05/23 102 lb 9.6 oz (46.5 kg)  03/29/23 101 lb (45.8 kg)    Performance status (ECOG): 0 - Asymptomatic  PHYSICAL EXAM:   GENERAL:alert, no distress and comfortable SKIN: skin color, texture, turgor are normal, no rashes or significant lesions LUNGS: clear to auscultation and percussion with normal breathing effort HEART: regular rate & rhythm and no murmurs and no lower extremity edema ABDOMEN:abdomen soft, non-tender and normal bowel sounds Musculoskeletal:no cyanosis of digits and no clubbing  NEURO: alert & oriented x 3 with fluent speech  LABORATORY DATA:  I have reviewed the data as listed    Component Value Date/Time   NA 138 04/13/2023 1235   K 4.1 04/13/2023 1235   CL 106 04/13/2023 1235   CO2 21 (L) 04/13/2023 1235   GLUCOSE 94 04/13/2023 1235   BUN 26 (H) 04/13/2023 1235    CREATININE 0.95 04/13/2023 1235   CALCIUM 9.3 04/13/2023 1235   PROT 7.0 04/13/2023 1235   ALBUMIN 3.5 04/13/2023 1235   AST 16 04/13/2023 1235   ALT 18 04/13/2023 1235   ALKPHOS 62 04/13/2023 1235   BILITOT 0.3 04/13/2023 1235   GFRNONAA >60 04/13/2023 1235     Lab Results  Component Value Date   WBC 10.0 04/13/2023   NEUTROABS 7.8 (H) 04/13/2023   HGB 10.9 (L) 04/13/2023   HCT 34.9 (L) 04/13/2023   MCV 100.3 (H) 04/13/2023   PLT 553 (H) 04/13/2023      Chemistry      Component Value Date/Time   NA 138 04/13/2023 1235   K 4.1 04/13/2023 1235   CL 106 04/13/2023 1235   CO2 21 (L) 04/13/2023 1235   BUN 26 (H) 04/13/2023 1235   CREATININE 0.95 04/13/2023 1235      Component Value Date/Time   CALCIUM 9.3 04/13/2023 1235   ALKPHOS 62 04/13/2023 1235   AST 16 04/13/2023 1235   ALT 18 04/13/2023 1235   BILITOT 0.3 04/13/2023 1235      Latest Reference Range & Units 04/13/23 12:35  Iron 28 - 170 ug/dL 56  UIBC ug/dL 782  TIBC 956 - 213 ug/dL 086  Saturation Ratios 10.4 - 31.8 % 17  Ferritin 11 - 307 ng/mL 103   RADIOGRAPHIC STUDIES:  I have personally reviewed the radiological images as listed and agreed with the findings in the report.   MR Brain W Wo Contrast CLINICAL DATA:  Small-cell lung cancer, monitor  EXAM: MRI HEAD WITHOUT AND WITH CONTRAST  TECHNIQUE: Multiplanar, multiecho pulse sequences of the brain and surrounding structures were obtained without and with intravenous contrast.  CONTRAST:  5mL GADAVIST GADOBUTROL 1 MMOL/ML IV SOLN  COMPARISON:  11/22/2022  FINDINGS: Brain: There is no acute infarction or intracranial hemorrhage. There is no intracranial mass, mass effect, or edema. There is no hydrocephalus or extra-axial fluid collection. Ventricles and sulci are normal in size and configuration. Patchy T2 hyperintensity in the supratentorial and pontine white matter probably reflects stable mild chronic microvascular ischemic changes. A  punctate focus of susceptibility is again noted within the left temporal lobe, which may reflect chronic blood products or mineralization. No abnormal enhancement.  Vascular: Major vessel flow voids at the skull base are preserved.  Skull and upper cervical spine: Normal marrow signal is preserved.  Sinuses/Orbits: Paranasal sinuses are aerated. Orbits are unremarkable.  Other: Sella is unremarkable. Mastoid air cells are clear. Mildly T2 hyperintense 1.6 cm left infra-auricular lesion with enhancement at the periphery (series 10, image 3) has increased in  size (previously 1 cm).  IMPRESSION: No evidence of intracranial metastatic disease.  Increase in size of a left infra-auricular lesion involving or superficial to the left parotid. Notably, no increased uptake in this region on October 2024 PET CT  Electronically Signed   By: Guadlupe Spanish M.D.   On: 03/30/2023 10:18

## 2023-04-14 NOTE — Assessment & Plan Note (Signed)
 Likely secondary to chemotherapy.  Iron panel and ferritin within normal limits.  TSAT is slightly low. -Recommended to eat a diet rich in protein and  green leafy vegetables

## 2023-04-14 NOTE — Assessment & Plan Note (Addendum)
 Oncology history below. 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 Treatment response scan showed new lung nodules.  Patient was evaluated for clinical trial at [facility] (Dr.Patel) prior to the biopsy.  Bronchoscopy with biopsy of the new lung lesions showed atypical cells and no evidence of malignancy.  AFB stain grew acid-fast bacilli, speciation is pending at this time.  -Discussed the patient's case at lung tumor board at diagnosis, consensus to treat it like a limited stage small cell lung cancer with chemoRT -Discussed starting durvalumab immunotherapy.  Common adverse effects, risk versus benefits discussed in detail. -Will await on ID evaluation for possible Mycobacterium treatment after speciation is available. Plan to start immunotherapy next week, pending clearance from Infectious Disease specialist. -Will obtain a new baseline CT chest before starting immunotherapy  Return to clinic in 2 weeks to discuss further management.

## 2023-04-14 NOTE — Assessment & Plan Note (Signed)
-  Patient is an avid smoker with history of smoking 2 packs/day until recently -Patient continues to smoke -Discussed the risks of smoking including faster progression of cancer, inflammation causing difficulties with chemotherapy and radiation -Offered help to quit smoking, patient is reluctant at this time but stated that she has cut down a lot

## 2023-04-14 NOTE — Assessment & Plan Note (Addendum)
 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  Speciation not available at this time. -Will refer to infectious diseases for management of this and to monitor closely especially when restart immunotherapy.

## 2023-04-15 ENCOUNTER — Inpatient Hospital Stay: Payer: HMO

## 2023-04-18 ENCOUNTER — Other Ambulatory Visit: Payer: Self-pay | Admitting: *Deleted

## 2023-04-18 NOTE — Progress Notes (Signed)
 Patient made aware and verbalized understanding.

## 2023-04-19 ENCOUNTER — Ambulatory Visit (HOSPITAL_COMMUNITY): Payer: HMO

## 2023-04-19 ENCOUNTER — Ambulatory Visit (HOSPITAL_COMMUNITY)
Admission: RE | Admit: 2023-04-19 | Discharge: 2023-04-19 | Disposition: A | Discharge status: Home / Self Care | Source: Ambulatory Visit | Attending: Oncology | Admitting: Oncology

## 2023-04-19 ENCOUNTER — Other Ambulatory Visit: Payer: Self-pay | Admitting: Oncology

## 2023-04-19 DIAGNOSIS — R918 Other nonspecific abnormal finding of lung field: Secondary | ICD-10-CM | POA: Insufficient documentation

## 2023-04-19 DIAGNOSIS — C3430 Malignant neoplasm of lower lobe, unspecified bronchus or lung: Secondary | ICD-10-CM | POA: Diagnosis not present

## 2023-04-19 DIAGNOSIS — Z1289 Encounter for screening for malignant neoplasm of other sites: Secondary | ICD-10-CM | POA: Insufficient documentation

## 2023-04-19 DIAGNOSIS — J439 Emphysema, unspecified: Secondary | ICD-10-CM | POA: Diagnosis not present

## 2023-04-19 DIAGNOSIS — C3432 Malignant neoplasm of lower lobe, left bronchus or lung: Secondary | ICD-10-CM | POA: Diagnosis not present

## 2023-04-19 DIAGNOSIS — C7801 Secondary malignant neoplasm of right lung: Secondary | ICD-10-CM

## 2023-04-19 DIAGNOSIS — R935 Abnormal findings on diagnostic imaging of other abdominal regions, including retroperitoneum: Secondary | ICD-10-CM | POA: Diagnosis not present

## 2023-04-19 MED ORDER — IOHEXOL 300 MG/ML  SOLN
80.0000 mL | Freq: Once | INTRAMUSCULAR | Status: AC | PRN
Start: 2023-04-19 — End: 2023-04-19
  Administered 2023-04-19: 80 mL via INTRAVENOUS

## 2023-04-19 NOTE — Progress Notes (Addendum)
 ON PATHWAY REGIMEN - Small Cell Lung  No Change  Continue With Treatment as Ordered.  Original Decision Date/Time: 11/29/2022 09:04     A cycle is every 21 days:     Etoposide      Cisplatin   **Always confirm dose/schedule in your pharmacy ordering system**  Patient Characteristics: Newly Diagnosed, Preoperative or Nonsurgical Candidate (Clinical Staging), First Line, Limited Stage, Nonsurgical Candidate Therapeutic Status: Newly Diagnosed, Preoperative or Nonsurgical Candidate (Clinical Staging) AJCC T Category: cT1b AJCC N Category: cN3 AJCC M Category: cM1a AJCC 8 Stage Grouping: IVA Stage Classification: Limited Surgical Candidacy: Nonsurgical Candidate Intent of Therapy: Curative Intent, Discussed with Patient

## 2023-04-19 NOTE — Progress Notes (Signed)
 Pharmacist Chemotherapy Monitoring - Initial Assessment    Anticipated start date: 04/20/23   The following has been reviewed per standard work regarding the patient's treatment regimen: The patient's diagnosis, treatment plan and drug doses, and organ/hematologic function Lab orders and baseline tests specific to treatment regimen  The treatment plan start date, drug sequencing, and pre-medications Prior authorization status  Patient's documented medication list, including drug-drug interaction screen and prescriptions for anti-emetics and supportive care specific to the treatment regimen The drug concentrations, fluid compatibility, administration routes, and timing of the medications to be used The patient's access for treatment and lifetime cumulative dose history, if applicable  The patient's medication allergies and previous infusion related reactions, if applicable   Changes made to treatment plan:  N/A  Follow up needed:  N/A   Stephens Shire, [facility], 04/19/2023  4:32 PM

## 2023-04-20 ENCOUNTER — Inpatient Hospital Stay: Payer: Self-pay | Attending: Oncology

## 2023-04-20 VITALS — BP 125/75 | HR 92 | Temp 98.1°F | Resp 18

## 2023-04-20 DIAGNOSIS — C7801 Secondary malignant neoplasm of right lung: Secondary | ICD-10-CM | POA: Diagnosis not present

## 2023-04-20 DIAGNOSIS — Z79620 Long term (current) use of immunosuppressive biologic: Secondary | ICD-10-CM | POA: Diagnosis not present

## 2023-04-20 DIAGNOSIS — Z5112 Encounter for antineoplastic immunotherapy: Secondary | ICD-10-CM | POA: Insufficient documentation

## 2023-04-20 DIAGNOSIS — F17210 Nicotine dependence, cigarettes, uncomplicated: Secondary | ICD-10-CM | POA: Insufficient documentation

## 2023-04-20 DIAGNOSIS — C7802 Secondary malignant neoplasm of left lung: Secondary | ICD-10-CM | POA: Diagnosis not present

## 2023-04-20 LAB — TSH: TSH: 1.963 u[IU]/mL (ref 0.350–4.500)

## 2023-04-20 MED ORDER — LIDOCAINE-PRILOCAINE 2.5-2.5 % EX CREA: TOPICAL_CREAM | CUTANEOUS | 3 refills | Status: DC

## 2023-04-20 MED ORDER — DURVALUMAB 500 MG/10ML IV SOLN
1500.0000 mg | Freq: Once | INTRAVENOUS | Status: AC
  Administered 2023-04-20: 1500 mg via INTRAVENOUS
  Filled 2023-04-20: qty 30

## 2023-04-20 MED ORDER — HEPARIN SOD (PORK) LOCK FLUSH 100 UNIT/ML IV SOLN
500.0000 [IU] | Freq: Once | INTRAVENOUS | Status: AC | PRN
  Administered 2023-04-20: 500 [IU]

## 2023-04-20 MED ORDER — SODIUM CHLORIDE 0.9% FLUSH
10.0000 mL | INTRAVENOUS | Status: DC | PRN
Start: 2023-04-20 — End: 2023-04-20
  Administered 2023-04-20: 10 mL

## 2023-04-20 MED ORDER — PROCHLORPERAZINE MALEATE 10 MG PO TABS: 10.0000 mg | ORAL_TABLET | Freq: Four times a day (QID) | ORAL | 1 refills | Status: AC | PRN

## 2023-04-20 MED ORDER — SODIUM CHLORIDE 0.9 % IV SOLN: INTRAVENOUS | Status: DC

## 2023-04-20 MED ORDER — ONDANSETRON HCL 8 MG PO TABS: 8.0000 mg | ORAL_TABLET | Freq: Three times a day (TID) | ORAL | 1 refills | Status: AC | PRN

## 2023-04-20 NOTE — Progress Notes (Signed)
 Patient presents today for D1 C1 Imfinzi infusion per providers order.  Vital signs and labs within parameters for treatment.  Herat rate noted to be 114, MD notified and message received from Dr. Anders Simmonds, okay to proceed with treatment.  Treatment given today per MD orders.  Stable during infusion without adverse affects.  Vital signs stable.  No complaints at this time.  Discharge from clinic ambulatory in stable condition.  Alert and oriented X 3.  Follow up with [facility] as scheduled.

## 2023-04-20 NOTE — Patient Instructions (Signed)
 [facility] [place] - A DEPT OF [facility]  Discharge Instructions: Thank you for choosing [facility] to provide your oncology and hematology care.  If you have a lab appointment with the Cancer Center - please note that after April 8th, 2024, all labs will be drawn in the cancer center.  You do not have to check in or register with the main entrance as you have in the past but will complete your check-in in the cancer center.  Wear comfortable clothing and clothing appropriate for easy access to any Portacath or PICC line.   We strive to give you quality time with your provider. You may need to reschedule your appointment if you arrive late (15 or more minutes).  Arriving late affects you and other patients whose appointments are after yours.  Also, if you miss three or more appointments without notifying the office, you may be dismissed from the clinic at the provider's discretion.      For prescription refill requests, have your pharmacy contact our office and allow 72 hours for refills to be completed.    Today you received the following chemotherapy and/or immunotherapy agents Imfinzi      To help prevent nausea and vomiting after your treatment, we encourage you to take your nausea medication as directed.  BELOW ARE SYMPTOMS THAT SHOULD BE REPORTED IMMEDIATELY: *FEVER GREATER THAN 100.4 F (38 C) OR HIGHER *CHILLS OR SWEATING *NAUSEA AND VOMITING THAT IS NOT CONTROLLED WITH YOUR NAUSEA MEDICATION *UNUSUAL SHORTNESS OF BREATH *UNUSUAL BRUISING OR BLEEDING *URINARY PROBLEMS (pain or burning when urinating, or frequent urination) *BOWEL PROBLEMS (unusual diarrhea, constipation, pain near the anus) TENDERNESS IN MOUTH AND THROAT WITH OR WITHOUT PRESENCE OF ULCERS (sore throat, sores in mouth, or a toothache) UNUSUAL RASH, SWELLING OR PAIN  UNUSUAL VAGINAL DISCHARGE OR ITCHING   Items with * indicate a potential emergency and should be followed up  as soon as possible or go to the Emergency Department if any problems should occur.  Please show the CHEMOTHERAPY ALERT CARD or IMMUNOTHERAPY ALERT CARD at check-in to the Emergency Department and triage nurse.  Should you have questions after your visit or need to cancel or reschedule your appointment, please contact [facility] [place] - A DEPT OF [facility] [phone]  and follow the prompts.  Office hours are 8:00 a.m. to 4:30 p.m. Monday - Friday. Please note that voicemails left after 4:00 p.m. may not be returned until the following business day.  We are closed weekends and major holidays. You have access to a nurse at all times for urgent questions. Please call the main number to the clinic [phone] and follow the prompts.  For any non-urgent questions, you may also contact your provider using MyChart. We now offer e-Visits for anyone 42 and older to request care online for non-urgent symptoms. For details visit [email or web address].   Also download the MyChart app! Go to the app store, search "MyChart", open the app, select [place], and log in with your MyChart username and password.

## 2023-04-21 ENCOUNTER — Ambulatory Visit: Payer: Self-pay

## 2023-04-21 ENCOUNTER — Other Ambulatory Visit: Payer: Self-pay

## 2023-04-21 LAB — ACID FAST ID BY PCR AND SUSCEPTIBILITIES
M Tuberculosis Complex: NEGATIVE
M avium complex: NEGATIVE

## 2023-04-21 LAB — ACID FAST CULTURE WITH REFLEXED SENSITIVITIES (MYCOBACTERIA): Acid Fast Culture: POSITIVE — AB

## 2023-04-21 LAB — T4: T4, Total: 7.7 ug/dL (ref 4.5–12.0)

## 2023-04-21 LAB — MYCOBACTERIA ID BY MALDI

## 2023-04-21 NOTE — Progress Notes (Signed)
 24 HOUR CHEMOTHERAPY CALL BACK:  Patient is feeling well with no side effects noted.  Patient advised to call back with any further questions or concerns.

## 2023-04-25 LAB — ORGANISM ID BY MALDI

## 2023-04-28 LAB — FUNGUS CULTURE WITH STAIN

## 2023-04-28 LAB — FUNGUS CULTURE RESULT

## 2023-04-28 LAB — FUNGAL ORGANISM REFLEX

## 2023-05-02 ENCOUNTER — Ambulatory Visit: Payer: HMO | Admitting: Infectious Disease

## 2023-05-02 ENCOUNTER — Ambulatory Visit: Admitting: Pharmacist

## 2023-05-05 ENCOUNTER — Encounter: Payer: Self-pay | Admitting: Infectious Disease

## 2023-05-05 ENCOUNTER — Other Ambulatory Visit: Payer: Self-pay

## 2023-05-05 ENCOUNTER — Ambulatory Visit: Admitting: Pharmacist

## 2023-05-05 ENCOUNTER — Ambulatory Visit: Admitting: Infectious Disease

## 2023-05-05 VITALS — BP 127/87 | HR 121 | Temp 97.7°F | Wt 103.0 lb

## 2023-05-05 DIAGNOSIS — C7801 Secondary malignant neoplasm of right lung: Secondary | ICD-10-CM

## 2023-05-05 DIAGNOSIS — A319 Mycobacterial infection, unspecified: Secondary | ICD-10-CM

## 2023-05-05 DIAGNOSIS — R590 Localized enlarged lymph nodes: Secondary | ICD-10-CM

## 2023-05-05 DIAGNOSIS — C7802 Secondary malignant neoplasm of left lung: Secondary | ICD-10-CM

## 2023-05-05 DIAGNOSIS — Z7185 Encounter for immunization safety counseling: Secondary | ICD-10-CM

## 2023-05-05 NOTE — Progress Notes (Signed)
 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  She was initially treated with radiation at [facility] along with chemotherapy with cisplatin and etoposide having completed treatment in December 2024.  She had a CT scan to assess response to treatment which showed new pulmonary nodules.  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  Her oncologist has initiated immunotherapy with durvalumab patient received her first dose earlier this month.  She had new baseline CT scan performed which showed the following:  After initiation of immunotherapy she has not had any worsening of pulmonary symptoms.  She does have coughing fits from time to time at times on a daily basis but not typically every day she has actually gained weight so that she has come back off of chemotherapy and been able to have a better appetite and enjoy eating food.  She does not have night sweats or fevers but does suffer from hot flashes at times.  Certainly at this point in time if she were not an immunosuppressed patient she would not meet criteria for treating a nontuberculous mycobacteria which include the following: Symptoms consistent with the infection with radiographic findings and a positive culture the main issue with her as I do not find her  symptoms compelling enough to constitute an active infection with this organism.  I do of course worry about the risk of this organism causing her problems in the context of her immunotherapy but the risk is lower with this form immunotherapy versus others and other suppressive therapies.  At this point in time I think it is prudent to err on the side of observation and then acting if she has symptoms.  Treatment of nontuberculous mycobacteria and including and especially Mycobacterium abscessus is not a simple matter and requires multiple antibiotics which are frequently difficult to tolerate is also quite challenging to ever embark on an empiric regimen for this organism.  Therefore we will have her follow-up with [place] in April after she received her second infusion of immunotherapy.  Certainly if she has worsening symptoms of cough fever drenching sweats weight loss or other things to suggest active mycobacterial infection we could see her sooner and we could even p.o. that promptly initiate empiric therapy though again we would prefer to have directed therapy based on culture data.  I would like in the interim for her primary care doctors and other doctors to avoid macrolide antibiotics and flora quinolones as these can frequently have activity against nontuberculous mycobacteria and we would not want to have these bacteria then develop resistance to monotherapy and hope to use them as part of an arsenal if we needed to against her nontuberculous Mycobacterium.     Review of Systems  Constitutional:  Negative for activity change, appetite change, chills, diaphoresis, fatigue, fever and unexpected weight change.  HENT:  Negative for congestion, rhinorrhea, sinus pressure, sneezing, sore throat and  trouble swallowing.   Eyes:  Negative for photophobia and visual disturbance.  Respiratory:  Positive for cough. Negative for chest tightness, shortness of breath, wheezing and stridor.    Cardiovascular:  Negative for chest pain, palpitations and leg swelling.  Gastrointestinal:  Negative for abdominal distention, abdominal pain, anal bleeding, blood in stool, constipation, diarrhea, nausea and vomiting.  Genitourinary:  Negative for difficulty urinating, dysuria, flank pain and hematuria.  Musculoskeletal:  Negative for arthralgias, back pain, gait problem, joint swelling and myalgias.  Skin:  Negative for color change, pallor, rash and wound.  Neurological:  Negative for dizziness, tremors, weakness and light-headedness.  Hematological:  Negative for adenopathy. Does not bruise/bleed easily.  Psychiatric/Behavioral:  Negative for agitation, behavioral problems, confusion, decreased concentration, dysphoric mood and sleep disturbance.        Objective:   Physical Exam Constitutional:      General: She is not in acute distress.    Appearance: Normal appearance. She is well-developed. She is not ill-appearing or diaphoretic.  HENT:     Head: Normocephalic and atraumatic.     Right Ear: Hearing and external ear normal.     Left Ear: Hearing and external ear normal.     Nose: No nasal deformity or rhinorrhea.  Eyes:     General: No scleral icterus.    Conjunctiva/sclera: Conjunctivae normal.     Right eye: Right conjunctiva is not injected.     Left eye: Left conjunctiva is not injected.     Pupils: Pupils are equal, round, and reactive to light.  Neck:     Vascular: No JVD.  Cardiovascular:     Rate and Rhythm: Normal rate and regular rhythm.     Heart sounds: Normal heart sounds, S1 normal and S2 normal. No murmur heard.    No friction rub. No gallop.  Pulmonary:     Effort: Prolonged expiration present. No respiratory distress.     Breath sounds: No stridor. No wheezing or rhonchi.  Abdominal:     General: Bowel sounds are normal. There is no distension.     Palpations: Abdomen is soft.     Tenderness: There is no abdominal tenderness.  Musculoskeletal:         General: Normal range of motion.     Right shoulder: Normal.     Left shoulder: Normal.     Cervical back: Normal range of motion and neck supple.     Right hip: Normal.     Left hip: Normal.     Right knee: Normal.     Left knee: Normal.  Lymphadenopathy:     Head:     Right side of head: No submandibular, preauricular or posterior auricular adenopathy.     Left side of head: No submandibular, preauricular or posterior auricular adenopathy.     Cervical: No cervical adenopathy.     Right cervical: No superficial or deep cervical adenopathy.    Left cervical: No superficial or deep cervical adenopathy.  Skin:    General: Skin is warm and dry.     Coloration: Skin is not pale.     Findings: No abrasion, bruising, ecchymosis, erythema, lesion or rash.     Nails: There is no clubbing.  Neurological:     Mental Status: She is alert and oriented to person, place, and time.     Sensory: No sensory deficit.     Coordination: Coordination normal.     Gait: Gait normal.  Psychiatric:  Attention and Perception: She is attentive.        Mood and Affect: Mood normal.        Speech: Speech normal.        Behavior: Behavior normal. Behavior is cooperative.        Thought Content: Thought content normal.        Judgment: Judgment normal.           Assessment & Plan:   M abscessus:  The see above discussion.  She does not meet criteria for treatment at this point in time though we are wary of potential worsening pathology of her Mycobacterium abscessus while she is on immunotherapy so far she has not shown any clinical worsening after her first dose of said therapy.  She is receiving her next dose in early April and I will plan on seeing her afterwards in April.  I have introduced her to Cedric Fishman one of our clinical pharmacists and we will eagerly anticipate the sensitivity data coming back on her nontuberculous mycobacteria so that if we need to treat her we can provide  targeted therapy.  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 Professional time spent includes the following activities: Preparing to see the patient (review of tests), Obtaining and/or reviewing separately obtained history (admission/discharge record), Performing a medically appropriate examination and/or evaluation , Ordering medications/tests/procedures, referring and communicating with other health care professionals, Documenting clinical information in the EMR, Independently interpreting results (not separately reported), Communicating results to the patient/family/caregiver, Counseling and educating the patient/family/caregiver and Care coordination (not separately reported).

## 2023-05-06 ENCOUNTER — Other Ambulatory Visit: Payer: Self-pay

## 2023-05-09 LAB — ACID FAST ID BY PCR AND SUSCEPTIBILITIES
M Tuberculosis Complex: NEGATIVE
M avium complex: NEGATIVE

## 2023-05-09 LAB — FUNGAL ORGANISM REFLEX

## 2023-05-09 LAB — ACID FAST CULTURE WITH REFLEXED SENSITIVITIES (MYCOBACTERIA): Acid Fast Culture: POSITIVE — AB

## 2023-05-09 LAB — RAPID GROWER BROTH SUSCEP.
Amikacin: 8
Clofazimine: 0.25
Doxycycline: 8
Tigecycline: 0.25
Tobramycin: 8

## 2023-05-09 LAB — ORGANISM ID BY MALDI

## 2023-05-09 LAB — ACID FAST SMEAR (AFB, MYCOBACTERIA): Acid Fast Smear: NEGATIVE

## 2023-05-09 LAB — FUNGUS CULTURE WITH STAIN

## 2023-05-09 LAB — MYCOBACTERIA ID BY MALDI

## 2023-05-09 LAB — FUNGUS CULTURE RESULT

## 2023-05-11 ENCOUNTER — Inpatient Hospital Stay: Payer: HMO | Admitting: Oncology

## 2023-05-11 ENCOUNTER — Inpatient Hospital Stay: Payer: HMO

## 2023-05-19 ENCOUNTER — Inpatient Hospital Stay: Attending: Oncology

## 2023-05-19 ENCOUNTER — Inpatient Hospital Stay

## 2023-05-19 ENCOUNTER — Inpatient Hospital Stay: Admitting: Dietician

## 2023-05-19 ENCOUNTER — Inpatient Hospital Stay: Admitting: Oncology

## 2023-05-19 VITALS — BP 144/85 | HR 101 | Temp 98.3°F | Resp 20 | Wt 104.9 lb

## 2023-05-19 VITALS — BP 141/83 | HR 70 | Temp 98.5°F | Resp 18

## 2023-05-19 DIAGNOSIS — C7801 Secondary malignant neoplasm of right lung: Secondary | ICD-10-CM

## 2023-05-19 DIAGNOSIS — D649 Anemia, unspecified: Secondary | ICD-10-CM | POA: Diagnosis not present

## 2023-05-19 DIAGNOSIS — A319 Mycobacterial infection, unspecified: Secondary | ICD-10-CM

## 2023-05-19 DIAGNOSIS — Z720 Tobacco use: Secondary | ICD-10-CM

## 2023-05-19 DIAGNOSIS — F17200 Nicotine dependence, unspecified, uncomplicated: Secondary | ICD-10-CM | POA: Diagnosis not present

## 2023-05-19 DIAGNOSIS — C7802 Secondary malignant neoplasm of left lung: Secondary | ICD-10-CM

## 2023-05-19 DIAGNOSIS — E611 Iron deficiency: Secondary | ICD-10-CM | POA: Insufficient documentation

## 2023-05-19 DIAGNOSIS — Z5112 Encounter for antineoplastic immunotherapy: Secondary | ICD-10-CM | POA: Diagnosis not present

## 2023-05-19 LAB — COMPREHENSIVE METABOLIC PANEL WITH GFR
ALT: 14 U/L (ref 0–44)
AST: 16 U/L (ref 15–41)
Albumin: 3.4 g/dL — ABNORMAL LOW (ref 3.5–5.0)
Alkaline Phosphatase: 56 U/L (ref 38–126)
Anion gap: 8 (ref 5–15)
BUN: 21 mg/dL (ref 8–23)
CO2: 22 mmol/L (ref 22–32)
Calcium: 9.1 mg/dL (ref 8.9–10.3)
Chloride: 108 mmol/L (ref 98–111)
Creatinine, Ser: 0.92 mg/dL (ref 0.44–1.00)
GFR, Estimated: >60 mL/min (ref >60)
Glucose, Bld: 161 mg/dL — ABNORMAL HIGH (ref 70–99)
Potassium: 3.4 mmol/L — ABNORMAL LOW (ref 3.5–5.1)
Sodium: 138 mmol/L (ref 135–145)
Total Bilirubin: 0.5 mg/dL (ref 0.0–1.2)
Total Protein: 6.5 g/dL (ref 6.5–8.1)

## 2023-05-19 LAB — CBC WITH DIFFERENTIAL/PLATELET
Abs Immature Granulocytes: 0.02 10*3/uL (ref 0.00–0.07)
Basophils Absolute: 0 10*3/uL (ref 0.0–0.1)
Basophils Relative: 0 %
Eosinophils Absolute: 0.1 10*3/uL (ref 0.0–0.5)
Eosinophils Relative: 2 %
HCT: 36.6 % (ref 36.0–46.0)
Hemoglobin: 11.3 g/dL — ABNORMAL LOW (ref 12.0–15.0)
Immature Granulocytes: 0 %
Lymphocytes Relative: 20 %
Lymphs Abs: 1.2 10*3/uL (ref 0.7–4.0)
MCH: 30.6 pg (ref 26.0–34.0)
MCHC: 30.9 g/dL (ref 30.0–36.0)
MCV: 99.2 fL (ref 80.0–100.0)
Monocytes Absolute: 0.5 10*3/uL (ref 0.1–1.0)
Monocytes Relative: 9 %
Neutro Abs: 4 10*3/uL (ref 1.7–7.7)
Neutrophils Relative %: 69 %
Platelets: 354 10*3/uL (ref 150–400)
RBC: 3.69 MIL/uL — ABNORMAL LOW (ref 3.87–5.11)
RDW: 14.6 % (ref 11.5–15.5)
WBC: 5.8 10*3/uL (ref 4.0–10.5)
nRBC: 0 % (ref 0.0–0.2)

## 2023-05-19 LAB — MAGNESIUM: Magnesium: 1.9 mg/dL (ref 1.7–2.4)

## 2023-05-19 MED ORDER — SODIUM CHLORIDE 0.9% FLUSH
10.0000 mL | INTRAVENOUS | Status: DC | PRN
  Administered 2023-05-19: 10 mL

## 2023-05-19 MED ORDER — POTASSIUM CHLORIDE CRYS ER 20 MEQ PO TBCR
40.0000 meq | EXTENDED_RELEASE_TABLET | Freq: Once | ORAL | Status: AC
  Administered 2023-05-19: 40 meq via ORAL
  Filled 2023-05-19: qty 2

## 2023-05-19 MED ORDER — SODIUM CHLORIDE 0.9 % IV SOLN
1500.0000 mg | Freq: Once | INTRAVENOUS | Status: AC
  Administered 2023-05-19: 1500 mg via INTRAVENOUS
  Filled 2023-05-19: qty 30

## 2023-05-19 MED ORDER — SODIUM CHLORIDE 0.9 % IV SOLN: INTRAVENOUS | Status: DC

## 2023-05-19 MED ORDER — HEPARIN SOD (PORK) LOCK FLUSH 100 UNIT/ML IV SOLN
500.0000 [IU] | Freq: Once | INTRAVENOUS | Status: AC | PRN
  Administered 2023-05-19: 500 [IU]

## 2023-05-19 MED ORDER — SODIUM CHLORIDE 0.9% FLUSH
10.0000 mL | Freq: Once | INTRAVENOUS | Status: AC
  Administered 2023-05-19: 10 mL via INTRAVENOUS

## 2023-05-19 NOTE — Progress Notes (Signed)
 Patient Care Team: Lianne Moris, PA-C as PCP - General (Family Medicine) Wendall Stade, MD as PCP - Cardiology (Cardiology) Cindie Crumbly, MD as Medical Oncologist (Medical Oncology) Therese Sarah, RN as Oncology Nurse Navigator (Medical Oncology)  Clinic Day:  05/19/2023  Referring physician: Lianne Moris, PA-C   CHIEF COMPLAINT:  CC: Stage IVA Small cell lug carcinoma    ASSESSMENT & PLAN:   Assessment & Plan:  Virginia Powell  is a 73 y.o. 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 Treatment response scan showed new lung nodules.  Patient was evaluated for clinical trial at [facility] (Dr.Patel) prior to the biopsy.  Bronchoscopy with biopsy of the new lung lesions showed atypical cells and no evidence of malignancy.  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  -Discussed the patient's case at lung tumor board at diagnosis, consensus to treat it like a limited stage small cell lung cancer with chemoRT -CT obtained prior to start of durvalumab showed some new nodules, unknown at this time if it is infection versus carcinoma. -Started on maintenance durvalumab.  Patient tolerating treatment well.  Cycle 2-day 1 today.  Reports no rash, diarrhea, shortness of breath.  Labs and physical exam stable today.  Continue with treatment today. -Will obtain a CT scan after third cycle to check for response and status of the new lung nodules reported on previous CT scan. -Continue to follow with ID for Mycobacterium abscessus.  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  Was seen by ID and currently on observation no active treatment.  Patient  reports occasional cough and shortness of breath during cough. -Continue to follow with ID  Anemia Patient has normocytic anemia, likely secondary to carcinoma and chemotherapy.  Iron labs show very mild iron deficiency. -Continue iron pills every other day. -Will continue to monitor  Tobacco use Reduced smoking, encouraged further reduction. - Encouraged further smoking reduction.   The patient understands the plans discussed today and is in agreement with them.  She knows to contact our office if she develops concerns prior to her next appointment.  I provided 30 minutes of face-to-face time during this encounter and > 50% was spent counseling as documented under my assessment and plan.    Cindie Crumbly, MD  [facility] 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 Growing solid nodule of the right lower lobe measuring 8 mm and new irregular solid nodule of the left lower lobe measuring 9.3 mm. Lung-RADS 4B, suspicious. Additional imaging evaluation or consultation with Pulmonology or Thoracic Surgery recommended.   09/09/2022 PET scan   IMPRESSION: 1. Hypermetabolic right hilar adenopathy and bilateral pulmonary nodules, findings indicative of synchronous primary bronchogenic carcinomas. No evidence of distant metastatic disease. 2. Vague slight thickening of lateral breast soft tissue with metabolism just below blood pool. Consider mammographic correlation, as clinically indicated. 3. Slight marginal irregularity of the liver raises suspicion for cirrhosis. 4. Bilateral adrenal adenomas.   10/02/2022 Imaging  CT chest without contrast: IMPRESSION: 1. No significant change in the appearance of tracer avid nodules within  the medial right lower lobe and superior segment of left lower lobe. 2. Stable appearance of tracer avid nodal conglomeration noted within the right hilum. Which is concerning for nodal metastasis. 3. Stable appearance of bilateral adrenal adenomas. No follow-up imaging recommended.   10/04/2022 Pathology Results   A. LUNG, LLL, FINE NEEDLE ASPIRATION:  - No malignant cells identified  - Granulomatous inflammation   B. LUNG, LLL, BRUSHING:  - No malignant cells identified  - Granulomatous inflammation D. LUNG, RLL, FINE NEEDLE ASPIRATION:  - No malignant cells identified   E. 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 Interval enlargement of the superior segment left lower lobe lesion with stable hypermetabolism. 2. Stable 9 mm medial right lower lobe pulmonary nodule with slightly increased SUV max. 3. Persistent hypermetabolic right hilar adenopathy. 4. New 10 mm sub solid lesion in the left lower lobe with SUV max of 2.14. This is likely inflammatory. Attention on future studies is suggested. 5. Stable 14 mm linear density in the right lateral breast with low level FDG uptake. An indolent breast cancer is possible. 6. No findings for abdominal/pelvic metastatic disease or osseous metastatic disease. 7. Stable benign bilateral adrenal gland adenomas.   11/22/2022 Imaging   MRI Brain: No evidence of intracranial metastatic disease.     11/30/2022 - 02/04/2023 Chemotherapy   Patient is on Treatment Plan : LUNG SMALL CELL Cisplatin (80) D1 + Etoposide (100) D1-3 q21d      12/13/2022 - 01/07/2023 Radiation Therapy   RT with Dr.Morris at [facility], [facility]   02/17/2023 Imaging   CT CAP: IMPRESSION: CHEST:   1. New bilateral pulmonary nodules. 2. Interval decrease in size of RIGHT hilar lymph nodes. 3. Decrease in size of  previously described pulmonary nodules.   PELVIS:   1. No evidence of metastatic disease in the abdomen pelvis. 2. Stable benign adrenal adenomas. 3. Sigmoid diverticulosis without diverticulitis.   03/17/2023 Imaging   MRI brain:  No evidence of metastatic disease.   03/29/2023 Procedure   Bronchoscopy with biopsy:  Pathology: Atypical cells AFB culture: Positive for Mycobacterium   04/19/2023 Imaging   CT CAP:  Lungs/pleura: New bilobed nodular lesion in the posterior left lower lobe measures 1.5 x 0.7 cm on image 42, series 2 there are 2 new 3-4 mm left lower lobe pulmonary nodules on image 46, series 2. There is a new 2-3 mm posterior left lower lobe pulmonary nodule on image 47, series 2. Previously seen lingular nodule adjacent to the fissure is nearly resolved. 9 x 6 mm right upper lobe nodule image 15, series 2 is essentially new. Increased size of right upper lobe nodule on image 18, series 2, now measuring 9 x 7 mm (previously this measured 7 x 5 mm). Increased size of right upper lobe lesion on image 22, series 2 which now measures 2.5 x 1.5 cm (previously this measured 0.9 x 0.7 cm). Cluster of superior right lower lobe nodules on images 22-24, series 2 have decreased in size. 2.2 x 1.5 cm lesion on image 27, series 2 in the right lower lobe previously measured 2.4 x 1.6 cm. There is new pleural-based lesion in the posteromedial right lower lobe measuring 3.6 x 2.1  cm on image 25, series 2. Multiple new right lower lobe and right middle lobe nodules have a tree-in-bud appearance suggesting an infectious or inflammatory process. No pleural effusion. Patent central airways. Moderate upper lung predominant emphysema.  IMPRESSION: *Mixed response to therapy with some pulmonary nodules increased in size, some decreased in size, and some resolved. *Multiple new right lower lobe and right middle lobe nodules have a tree-in-bud appearance suggesting an infectious or inflammatory process.  Consider short-term interval follow-up after completion of therapy to ensure resolution. *No evidence of metastatic disease in the abdomen or pelvis.   04/20/2023 -  Chemotherapy   Patient is on Treatment Plan : LUNG NSCLC Durvalumab (1500) q28d         Current Treatment: Completed Chemo RT with Cisplatin + Etoposide. On maintenance Durvalumab   INTERVAL HISTORY:  Virginia Powell is here today for follow up.  Patient tolerated previous cycle of durvalumab well.  Reports no rash, shortness of breath, diarrhea at this time.  She reported that she has occasional cough with mucousy phlegm that is associated with shortness of breath.  Once the cough resolves, shortness of breath resolves.  She does not report any other complaints today.  She reported that her appetite and weight have been increasing and she feels much better.  We discussed that the latest CT scan showed some new nodules, unclear at this time if we should consider this as carcinoma versus infection.  She understands that we will repeat a scan after 3 cycles, if this shows increase in the nodules will obtain a biopsy.  Patient saw ID for Mycobacterium abscessus and reported that she is not getting any treatment at this time.   I have reviewed the past medical history, past surgical history, social history and family history with the patient and they are unchanged from previous note.  ALLERGIES:  is allergic to morphine and codeine.  MEDICATIONS:  Current Outpatient Medications  Medication Sig Dispense Refill   albuterol (PROVENTIL) (2.5 MG/3ML) 0.083% nebulizer solution SMARTSIG:1 Vial(s) Via Nebulizer Every 4-6 Hours PRN     albuterol (VENTOLIN HFA) 108 (90 Base) MCG/ACT inhaler SMARTSIG:2 Puff(s) By Mouth Every 4 Hours     atorvastatin (LIPITOR) 40 MG tablet Take 40 mg by mouth daily.     benzonatate (TESSALON) 100 MG capsule Take 100 mg by mouth 3 (three) times daily as needed for cough.     ciprofloxacin (CIPRO) 500 MG  tablet Take 1 tablet (500 mg total) by mouth 2 (two) times daily. 20 tablet 0   CISPLATIN IV Inject into the vein every 21 ( twenty-one) days.     clindamycin (CLEOCIN) 300 MG capsule Take 300 mg by mouth 3 (three) times daily.     ETOPOSIDE IV Inject into the vein every 21 ( twenty-one) days.     ferrous sulfate 324 MG TBEC Take 324 mg by mouth.     fexofenadine (ALLEGRA) 180 MG tablet Take 180 mg by mouth daily as needed for allergies or rhinitis.     fluticasone (FLONASE) 50 MCG/ACT nasal spray Place into both nostrils daily as needed.     hydrochlorothiazide (HYDRODIURIL) 12.5 MG tablet Take 12.5 mg by mouth daily.     hydrOXYzine (ATARAX) 25 MG tablet Take 1 tablet by mouth at bedtime as needed.     lidocaine-prilocaine (EMLA) cream Apply a quarter-sized amount to port a cath site and cover with plastic wrap 1 hour prior to infusion appointments 30 g 3   lidocaine-prilocaine (EMLA)  cream Apply to affected area once 30 g 3   loratadine (CLARITIN) 10 MG tablet Take 10 mg by mouth daily as needed for allergies.     olmesartan (BENICAR) 40 MG tablet Take 1 tablet (40 mg total) by mouth daily. 30 tablet 0   ondansetron (ZOFRAN) 8 MG tablet Take 1 tablet (8 mg total) by mouth every 8 (eight) hours as needed for nausea or vomiting. 30 tablet 1   prochlorperazine (COMPAZINE) 10 MG tablet Take 1 tablet (10 mg total) by mouth every 6 (six) hours as needed for nausea or vomiting. 30 tablet 1   rizatriptan (MAXALT-MLT) 10 MG disintegrating tablet Take 10 mg by mouth as needed for migraine. May repeat in 2 hours if needed     sucralfate (CARAFATE) 1 GM/10ML suspension Take 10 mLs (1 g total) by mouth 4 (four) times daily -  with meals and at bedtime. 420 mL 0   TRELEGY ELLIPTA 100-62.5-25 MCG/ACT AEPB Inhale 1 puff into the lungs daily.     No current facility-administered medications for this visit.   Facility-Administered Medications Ordered in Other Visits  Medication Dose Route Frequency Provider  Last Rate Last Admin   0.9 %  sodium chloride infusion   Intravenous Continuous Cindie Crumbly, MD   Stopped at 05/19/23 1245   sodium chloride flush (NS) 0.9 % injection 10 mL  10 mL Intracatheter PRN Cindie Crumbly, MD   10 mL at 05/19/23 1245     REVIEW OF SYSTEMS:   Constitutional: Denies fevers, chills or abnormal weight loss Eyes: Denies blurriness of vision Ears, nose, mouth, throat, and face: Denies mucositis or sore throat Respiratory: Denies cough, dyspnea or wheezes Cardiovascular: Denies palpitation, chest discomfort or lower extremity swelling Gastrointestinal:  Denies nausea, heartburn or change in bowel habits Skin: Denies abnormal skin rashes Lymphatics: Denies new lymphadenopathy or easy bruising Neurological:Denies numbness, tingling or new weaknesses Behavioral/Psych: Mood is stable, no new changes  All other systems were reviewed with the patient and are negative.   VITALS:  Blood pressure (!) 144/85, pulse (!) 101, temperature 98.3 F (36.8 C), temperature source Oral, resp. rate 20, weight 104 lb 15 oz (47.6 kg), SpO2 95%.  Wt Readings from Last 3 Encounters:  05/19/23 104 lb 15 oz (47.6 kg)  05/05/23 103 lb (46.7 kg)  04/13/23 105 lb 9.6 oz (47.9 kg)    Performance status (ECOG): 0 - Asymptomatic  PHYSICAL EXAM:   GENERAL:alert, no distress and comfortable SKIN: skin color, texture, turgor are normal, no rashes or significant lesions LUNGS: clear to auscultation and percussion with normal breathing effort HEART: regular rate & rhythm and no murmurs and no lower extremity edema ABDOMEN:abdomen soft, non-tender and normal bowel sounds Musculoskeletal:no cyanosis of digits and no clubbing  NEURO: alert & oriented x 3 with fluent speech  LABORATORY DATA:  I have reviewed the data as listed    Component Value Date/Time   NA 138 05/19/2023 0854   K 3.4 (L) 05/19/2023 0854   CL 108 05/19/2023 0854   CO2 22 05/19/2023 0854   GLUCOSE 161 (H)  05/19/2023 0854   BUN 21 05/19/2023 0854   CREATININE 0.92 05/19/2023 0854   CALCIUM 9.1 05/19/2023 0854   PROT 6.5 05/19/2023 0854   ALBUMIN 3.4 (L) 05/19/2023 0854   AST 16 05/19/2023 0854   ALT 14 05/19/2023 0854   ALKPHOS 56 05/19/2023 0854   BILITOT 0.5 05/19/2023 0854   GFRNONAA >60 05/19/2023 0854    Lab Results  Component  Value Date   WBC 5.8 05/19/2023   NEUTROABS 4.0 05/19/2023   HGB 11.3 (L) 05/19/2023   HCT 36.6 05/19/2023   MCV 99.2 05/19/2023   PLT 354 05/19/2023      Chemistry      Component Value Date/Time   NA 138 05/19/2023 0854   K 3.4 (L) 05/19/2023 0854   CL 108 05/19/2023 0854   CO2 22 05/19/2023 0854   BUN 21 05/19/2023 0854   CREATININE 0.92 05/19/2023 0854      Component Value Date/Time   CALCIUM 9.1 05/19/2023 0854   ALKPHOS 56 05/19/2023 0854   AST 16 05/19/2023 0854   ALT 14 05/19/2023 0854   BILITOT 0.5 05/19/2023 0854      Latest Reference Range & Units 04/13/23 12:35  Iron 28 - 170 ug/dL 56  UIBC ug/dL 469  TIBC 629 - 528 ug/dL 413  Saturation Ratios 10.4 - 31.8 % 17  Ferritin 11 - 307 ng/mL 103   RADIOGRAPHIC STUDIES:  I have personally reviewed the radiological images as listed and agreed with the findings in the report.   CT CHEST ABDOMEN PELVIS W CONTRAST CLINICAL DATA:  Small-cell lung cancer of lower lobe of lung.  EXAM: CT CHEST, ABDOMEN, AND PELVIS WITH CONTRAST  TECHNIQUE: Multidetector CT imaging of the chest, abdomen and pelvis was performed following the standard protocol during bolus administration of intravenous contrast.  RADIATION DOSE REDUCTION: This exam was performed according to the departmental dose-optimization program which includes automated exposure control, adjustment of the mA and/or kV according to patient size and/or use of iterative reconstruction technique.  CONTRAST:  80mL OMNIPAQUE IOHEXOL 300 MG/ML  SOLN  COMPARISON:  CT chest, abdomen, and pelvis from  02/17/2023.  FINDINGS: CHEST:  Lungs/pleura: New bilobed nodular lesion in the posterior left lower lobe measures 1.5 x 0.7 cm on image 42, series 2 there are 2 new 3-4 mm left lower lobe pulmonary nodules on image 46, series 2. There is a new 2-3 mm posterior left lower lobe pulmonary nodule on image 47, series 2. Previously seen lingular nodule adjacent to the fissure is nearly resolved. 9 x 6 mm right upper lobe nodule image 15, series 2 is essentially new. Increased size of right upper lobe nodule on image 18, series 2, now measuring 9 x 7 mm (previously this measured 7 x 5 mm). Increased size of right upper lobe lesion on image 22, series 2 which now measures 2.5 x 1.5 cm (previously this measured 0.9 x 0.7 cm). Cluster of superior right lower lobe nodules on images 22-24, series 2 have decreased in size. 2.2 x 1.5 cm lesion on image 27, series 2 in the right lower lobe previously measured 2.4 x 1.6 cm. There is new pleural-based lesion in the posteromedial right lower lobe measuring 3.6 x 2.1 cm on image 25, series 2. Multiple new right lower lobe and right middle lobe nodules have a tree-in-bud appearance suggesting an infectious or inflammatory process. No pleural effusion. Patent central airways. Moderate upper lung predominant emphysema.  Mediastinum: No enlarged lymph nodes. Thoracic aorta normal in caliber. Heart size normal. No significant pericardial effusion.  ABDOMEN/PELVIS:  Liver/gallbladder: The liver is hypodense and this suggests hepatic steatosis. Hepatic contour is normal. No suspicious hepatic lesion. Gallbladder unremarkable.  Adrenal: Nonspecific low-density thickening of both adrenal glands is redemonstrated and unchanged significantly.  Renal: No hydronephrosis. No suspicious renal lesion.  Spleen: Normal in size.  Pancreas: Unremarkable without surrounding infiltration or fluid.  Vascular: Aorta and  common iliac arteries normal in  caliber.  Urinary bladder: Mostly decompressed.  GI: No CT finding to suggest acute diverticulitis or colitis. No dilated small bowel loops. Appendix normal.  Other: No drainable fluid collection. No free air.  SKELETON:  No suspicious osseous lesion. No acute fracture identified.  IMPRESSION: *Mixed response to therapy with some pulmonary nodules increased in size, some decreased in size, and some resolved. *Multiple new right lower lobe and right middle lobe nodules have a tree-in-bud appearance suggesting an infectious or inflammatory process. Consider short-term interval follow-up after completion of therapy to ensure resolution. *No evidence of metastatic disease in the abdomen or pelvis. * Please see above for more details.  Electronically Signed   By: Ala Bent M.D.   On: 04/27/2023 19:27

## 2023-05-19 NOTE — Patient Instructions (Signed)
 [facility] - A DEPT OF [facility]  Discharge Instructions: Thank you for choosing [facility] to provide your oncology and hematology care.  If you have a lab appointment with the Cancer Center - please note that after April 8th, 2024, all labs will be drawn in the cancer center.  You do not have to check in or register with the main entrance as you have in the past but will complete your check-in in the cancer center.  Wear comfortable clothing and clothing appropriate for easy access to any Portacath or PICC line.   We strive to give you quality time with your provider. You may need to reschedule your appointment if you arrive late (15 or more minutes).  Arriving late affects you and other patients whose appointments are after yours.  Also, if you miss three or more appointments without notifying the office, you may be dismissed from the clinic at the provider's discretion.      For prescription refill requests, have your pharmacy contact our office and allow 72 hours for refills to be completed.    Today you received the following chemotherapy and/or immunotherapy agents Imfinzi.  Durvalumab Injection What is this medication? DURVALUMAB (dur VAL ue mab) treats some types of cancer. It works by helping your immune system slow or stop the spread of cancer cells. It is a monoclonal antibody. This medicine may be used for other purposes; ask your health care provider or pharmacist if you have questions. COMMON BRAND NAME(S): IMFINZI What should I tell my care team before I take this medication? They need to know if you have any of these conditions: Allogeneic stem cell transplant (uses someone else's stem cells) Autoimmune diseases, such as Crohn disease, ulcerative colitis, lupus History of chest radiation Nervous system problems, such as Guillain-Barre syndrome, myasthenia gravis Organ transplant An unusual or allergic reaction to durvalumab,  other medications, foods, dyes, or preservatives Pregnant or trying to get pregnant Breast-feeding How should I use this medication? This medication is infused into a vein. It is given by your care team in a hospital or clinic setting. A special MedGuide will be given to you before each treatment. Be sure to read this information carefully each time. Talk to your care team about the use of this medication in children. Special care may be needed. Overdosage: If you think you have taken too much of this medicine contact a poison control center or emergency room at once. NOTE: This medicine is only for you. Do not share this medicine with others. What if I miss a dose? Keep appointments for follow-up doses. It is important not to miss your dose. Call your care team if you are unable to keep an appointment. What may interact with this medication? Interactions have not been studied. This list may not describe all possible interactions. Give your health care provider a list of all the medicines, herbs, non-prescription drugs, or dietary supplements you use. Also tell them if you smoke, drink alcohol, or use illegal drugs. Some items may interact with your medicine. What should I watch for while using this medication? Your condition will be monitored carefully while you are receiving this medication. You may need blood work while taking this medication. This medication may cause serious skin reactions. They can happen weeks to months after starting the medication. Contact your care team right away if you notice fevers or flu-like symptoms with a rash. The rash may be red or  purple and then turn into blisters or peeling of the skin. You may also notice a red rash with swelling of the face, lips, or lymph nodes in your neck or under your arms. Tell your care team right away if you have any change in your eyesight. Talk to your care team if you may be pregnant. Serious birth defects can occur if you take  this medication during pregnancy and for 3 months after the last dose. You will need a negative pregnancy test before starting this medication. Contraception is recommended while taking this medication and for 3 months after the last dose. Your care team can help you find the option that works for you. Do not breastfeed while taking this medication and for 3 months after the last dose. What side effects may I notice from receiving this medication? Side effects that you should report to your care team as soon as possible: Allergic reactions--skin rash, itching, hives, swelling of the face, lips, tongue, or throat Dry cough, shortness of breath or trouble breathing Eye pain, redness, irritation, or discharge with blurry or decreased vision Heart muscle inflammation--unusual weakness or fatigue, shortness of breath, chest pain, fast or irregular heartbeat, dizziness, swelling of the ankles, feet, or hands Hormone gland problems--headache, sensitivity to light, unusual weakness or fatigue, dizziness, fast or irregular heartbeat, increased sensitivity to cold or heat, excessive sweating, constipation, hair loss, increased thirst or amount of urine, tremors or shaking, irritability Infusion reactions--chest pain, shortness of breath or trouble breathing, feeling faint or lightheaded Kidney injury (glomerulonephritis)--decrease in the amount of urine, red or dark brown urine, foamy or bubbly urine, swelling of the ankles, hands, or feet Liver injury--right upper belly pain, loss of appetite, nausea, light-colored stool, dark yellow or brown urine, yellowing skin or eyes, unusual weakness or fatigue Pain, tingling, or numbness in the hands or feet, muscle weakness, change in vision, confusion or trouble speaking, loss of balance or coordination, trouble walking, seizures Rash, fever, and swollen lymph nodes Redness, blistering, peeling, or loosening of the skin, including inside the mouth Sudden or severe  stomach pain, bloody diarrhea, fever, nausea, vomiting Side effects that usually do not require medical attention (report these to your care team if they continue or are bothersome): Bone, joint, or muscle pain Diarrhea Fatigue Loss of appetite Nausea Skin rash This list may not describe all possible side effects. Call your doctor for medical advice about side effects. You may report side effects to FDA at 1-800-FDA-1088. Where should I keep my medication? This medication is given in a hospital or clinic. It will not be stored at home. NOTE: This sheet is a summary. It may not cover all possible information. If you have questions about this medicine, talk to your doctor, pharmacist, or health care provider.  2024 Elsevier/Gold Standard (2021-06-16 00:00:00)        To help prevent nausea and vomiting after your treatment, we encourage you to take your nausea medication as directed.  BELOW ARE SYMPTOMS THAT SHOULD BE REPORTED IMMEDIATELY: *FEVER GREATER THAN 100.4 F (38 C) OR HIGHER *CHILLS OR SWEATING *NAUSEA AND VOMITING THAT IS NOT CONTROLLED WITH YOUR NAUSEA MEDICATION *UNUSUAL SHORTNESS OF BREATH *UNUSUAL BRUISING OR BLEEDING *URINARY PROBLEMS (pain or burning when urinating, or frequent urination) *BOWEL PROBLEMS (unusual diarrhea, constipation, pain near the anus) TENDERNESS IN MOUTH AND THROAT WITH OR WITHOUT PRESENCE OF ULCERS (sore throat, sores in mouth, or a toothache) UNUSUAL RASH, SWELLING OR PAIN  UNUSUAL VAGINAL DISCHARGE OR ITCHING   Items  with * indicate a potential emergency and should be followed up as soon as possible or go to the Emergency Department if any problems should occur.  Please show the CHEMOTHERAPY ALERT CARD or IMMUNOTHERAPY ALERT CARD at check-in to the Emergency Department and triage nurse.  Should you have questions after your visit or need to cancel or reschedule your appointment, please contact [facility] [place] - A DEPT OF [facility]igh[facility] [phone]  and follow the prompts.  Office hours are 8:00 a.m. to 4:30 p.m. Monday - Friday. Please note that voicemails left after 4:00 p.m. may not be returned until the following business day.  We are closed weekends and major holidays. You have access to a nurse at all times for urgent questions. Please call the main number to the clinic [phone] and follow the prompts.  For any non-urgent questions, you may also contact your provider using MyChart. We now offer e-Visits for anyone 68 and older to request care online for non-urgent symptoms. For details visit [email or web address].   Also download the MyChart app! Go to the app store, search "MyChart", open the app, select [place], and log in with your MyChart username and password.

## 2023-05-19 NOTE — Assessment & Plan Note (Signed)
 Patient has normocytic anemia, likely secondary to carcinoma and chemotherapy.  Iron labs show very mild iron deficiency. -Continue iron pills every other day. -Will continue to monitor

## 2023-05-19 NOTE — Assessment & Plan Note (Signed)
 Oncology history below. 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 Treatment response scan showed new lung nodules.  Patient was evaluated for clinical trial at [facility] (Dr.Patel) prior to the biopsy.  Bronchoscopy with biopsy of the new lung lesions showed atypical cells and no evidence of malignancy.  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  -Discussed the patient's case at lung tumor board at diagnosis, consensus to treat it like a limited stage small cell lung cancer with chemoRT -CT obtained prior to start of durvalumab showed some new nodules, unknown at this time if it is infection versus carcinoma. -Started on maintenance durvalumab.  Patient tolerating treatment well.  Cycle 2-day 1 today.  Reports no rash, diarrhea, shortness of breath.  Labs and physical exam stable today.  Continue with treatment today. -Will obtain a CT scan after third cycle to check for response and status of the new lung nodules reported on previous CT scan. -Continue to follow with ID for Mycobacterium abscessus.  Return to clinic in 4 weeks with labs prior to next cycle of durvalumab

## 2023-05-19 NOTE — Patient Instructions (Signed)
 VISIT SUMMARY:  You came in today with concerns about chest tightness and a cough following your recent cancer treatment. You also mentioned occasional sweating and hot flashes, as well as a history of smoking. Overall, you feel better compared to when you were first diagnosed with cancer.  YOUR PLAN: -LUNG CANCER: We will continue your infusions as planned. Report shortness of breath, diarrhea or rash.  -MAC infection: You have new nodules in your lungs that are related to an infection, not cancer. We will do a follow-up CT scan after four treatment cycles and may repeat the biopsy if the nodules increase. You will also consult with an infectious disease specialist for antibiotics.  -COUGH: Your intermittent cough and shortness of breath are likely due to mucus. You should continue using Mucinex and Vicks as they have been helping.  -IRON SUPPLEMENTATION: You are tolerating your iron supplements well, which is good for maintaining healthy iron levels. Continue taking your iron supplements, and we will check your iron levels with your next treatment cycle.  -SMOKING: You have reduced your smoking, which is a positive step. Keep working on reducing it further.  INSTRUCTIONS:  We will monitor your condition and treatment response. Please schedule a follow-up appointment in three weeks.

## 2023-05-19 NOTE — Assessment & Plan Note (Signed)
 Patient has Mycobacterium abscessus infection postchemotherapy.  Was seen by ID and currently on observation no active treatment.  Patient reports occasional cough and shortness of breath during cough. -Continue to follow with ID

## 2023-05-19 NOTE — Progress Notes (Signed)
 Nutrition Follow-up:  Pt with stage IV small cell lung cancer. Patient received first cisplatin/etoposide 10/15 concurrent with radiation. Final RT 11/25 under the care of Dr. Langston Masker [facility]). Pt currently receiving durvalumab q28d. Patient is under the care of Dr. Anders Simmonds  Met with pt in infusion. She reports doing well since starting immunotherapy. Pt says her taste has come back. She is eating well and gaining weight. Patient recalls blueberry muffin, ham biscuit, yogurt, lasagna, french bread, cucumbers yesterday. She is drinking lots of water. Pt denies nausea, vomiting, diarrhea, constipation.    Medications: reviewed   Labs: K 3.4, glucose 161, albumin 3.4  Anthropometrics: Wt 104 lb 15 oz today  3/20 - 103 lb 2/26 - 105 lb 9.6 oz 1/23 - 106 lb 6.4 oz   NUTRITION DIAGNOSIS: Food and nutrition related knowledge deficit improving   INTERVENTION:  Continue strategies for increasing calories and protein with small frequent meals/snacks  MONITORING, EVALUATION, GOAL: wt trends, intake   NEXT VISIT: Thursday May 1 during infusion

## 2023-05-19 NOTE — Progress Notes (Signed)
 Patient presents today for Imfinzi and follow up appointment with Dr. Anders Simmonds. Heart rate on arrival 101. Labs and vital signs reviewed by Dr. Anders Simmonds. Message received to proceed with treatment. Potassium 3.4. Standing orders followed.    Imfinzi given today per MD orders. Tolerated infusion without adverse affects. Vital signs stable. No complaints at this time. Discharged from clinic ambulatory in stable condition. Alert and oriented x 3. F/U with [facility] as scheduled.

## 2023-05-19 NOTE — Assessment & Plan Note (Signed)
 Reduced smoking, encouraged further reduction. - Encouraged further smoking reduction.

## 2023-05-20 ENCOUNTER — Other Ambulatory Visit: Payer: Self-pay

## 2023-05-30 ENCOUNTER — Other Ambulatory Visit: Payer: Self-pay

## 2023-05-31 ENCOUNTER — Other Ambulatory Visit: Payer: Self-pay

## 2023-05-31 DIAGNOSIS — C7801 Secondary malignant neoplasm of right lung: Secondary | ICD-10-CM

## 2023-06-07 NOTE — Progress Notes (Unsigned)
 Subjective:  Chief complaint dyspnea on exertion today and more coughing this morning   Patient ID: Virginia Powell, female    DOB: 1950-05-29, 73 y.o.   MRN: [id number]  HPI  Virginia Powell is a lovely 73 year old smoker with history of COPD and unfortunately small cell lung carcinoma metastatic to both lungs.  She was initially treated with radiation at [facility] along with chemotherapy with cisplatin  and etoposide  having completed treatment in December 2024.  She had a CT scan to assess response to treatment which showed new pulmonary nodules.  She was evaluated for clinical trial after biopsy at [facility] bronchoscopy of the new lung lesion showed atypical cells but no evidence of active malignancy AFB stain was positive and cultures from AFB yielded a nontuberculous Mycobacterium, namely Mycobacterium abscessus.  Her oncologist has initiated immunotherapy with durvalumab  patient received her first dose earlier this month.  She had new baseline CT scan performed which showed the following:  After initiation of immunotherapy she has not had any worsening of pulmonary symptoms.  She does have coughing fits from time to time at times on a daily basis but not typically every day she has actually gained weight so that she has come back off of chemotherapy and been able to have a better appetite and enjoy eating food.  She did not have night sweats or fevers but does suffer from hot flashes at times.  Certainly at this point in time if she were not an immunosuppressed patient she would not meet criteria for treating a nontuberculous mycobacteria which include the following: Symptoms consistent with the infection with radiographic findings and a positive culture the main issue with her as I do not find her symptoms compelling enough to constitute an active infection with this organism.  I do of course worry about the risk of this organism causing her problems in the context of her  immunotherapy but the risk is lower with this form immunotherapy versus others and other suppressive therapies.  At my first visit with her I did not find she had enough compelling symptoms to push us  to initiate therapy.  We had not done so yet we did get sensitivities back on the Mycobacterium abscessus unfortunately is not as resistant as we had feared:  Comment: Mycobacterium abscessus complex  Amikacin 8.0 ug/mL Susceptible    Cefoxitin Comment  Comment: (NOTE) 16.0 ug/mL Susceptible   Ciprofloxacin  Comment  Comment: (NOTE) 2.0 ug/mL Intermediate   Clarithromycin Comment  Comment: (NOTE) 0.06 ug/mL or less, Susceptible   Clofazimine 0.25 ug/mL  0.25 ug/mL  Comment: (NOTE) S  Doxycycline   8.0 ug/mL Resistant   Comment: (NOTE)   8.0 ug/mL Resistant  Comment: (NOTE)   Imipenem Comment  Comment: (NOTE) 8.0 ug/mL Intermediate   Linezolid Comment  Comment: (NOTE) 1.0 ug/mL or less, Susceptible   Moxifloxacin Comment  Comment: (NOTE) 2.0 ug/mL Intermediate   Tigecycline 0.25 ug/mL  Comment: (NOTE)   0.25 ug/mL      Tobramycin 8.0 ug/mL Resistant  8.0 ug/mL Resistant  Comment: (NOTE)    Trimethoprim/Sulfa Comment  Comment: (NOTE) 2/38 ug/mL Susceptible      Macrolides are fortunately active as was Bactrim Zyvox, clofazimine, as well as IV cefoxitin and also amikacin.  Discussed the use of AI scribe software for clinical note transcription with the patient, who gave verbal consent to proceed.  History of Present Illness   The patient, with a history of small cell lung cancer currently on immunotherapy,  and a recent bronchoscopy revealing Mycobacterium abscessus, presents with an acute episode of shortness of breath and coughing. She woke up during the night with coughing spells and felt out of breath and weak in the morning. She was unable to cough up mucus and her oxygen saturation was 93%. She managed her symptoms with a breathing treatment, which she  had not done since starting chemotherapy, and Mucinex, which improved her oxygen saturation to 97%.  Aside from this episode, the patient has been feeling well with no daily coughing. She does experience occasional coughing, more frequently in the mornings, but it usually resolves. She also reports occasional racing heart but no color in her mucus. She has had a few instances of feeling like a hot flash but did not get very wet. She also mentions having to clean some carpet the previous day and the smell might have triggered her symptoms.        Review of Systems  Constitutional:  Negative for chills and fever.  HENT:  Negative for congestion and sore throat.   Eyes:  Negative for photophobia.  Respiratory:  Positive for cough and shortness of breath. Negative for wheezing.   Cardiovascular:  Negative for chest pain, palpitations and leg swelling.  Gastrointestinal:  Negative for abdominal pain, blood in stool, constipation, diarrhea, nausea and vomiting.  Genitourinary:  Negative for dysuria, flank pain and hematuria.  Musculoskeletal:  Negative for back pain and myalgias.  Skin:  Negative for rash.  Neurological:  Negative for dizziness, weakness and headaches.  Hematological:  Does not bruise/bleed easily.  Psychiatric/Behavioral:  Negative for suicidal ideas.        Objective:   Physical Exam Constitutional:      General: She is not in acute distress.    Appearance: Normal appearance. She is well-developed. She is not ill-appearing or diaphoretic.  HENT:     Head: Normocephalic and atraumatic.     Right Ear: Hearing and external ear normal.     Left Ear: Hearing and external ear normal.     Nose: No nasal deformity or rhinorrhea.  Eyes:     General: No scleral icterus.    Extraocular Movements: Extraocular movements intact.     Conjunctiva/sclera: Conjunctivae normal.     Right eye: Right conjunctiva is not injected.     Left eye: Left conjunctiva is not injected.      Pupils: Pupils are equal, round, and reactive to light.  Neck:     Vascular: No JVD.  Cardiovascular:     Rate and Rhythm: Normal rate and regular rhythm.     Heart sounds: Normal heart sounds, S1 normal and S2 normal. No murmur heard.    No friction rub. No gallop.  Pulmonary:     Effort: Prolonged expiration present. No respiratory distress.     Breath sounds: No stridor. No wheezing, rhonchi or rales.  Abdominal:     General: There is no distension.     Palpations: Abdomen is soft.  Musculoskeletal:        General: Normal range of motion.     Right shoulder: Normal.     Left shoulder: Normal.     Cervical back: Normal range of motion and neck supple.     Right hip: Normal.     Left hip: Normal.     Right knee: Normal.     Left knee: Normal.  Lymphadenopathy:     Head:     Right side of head: No submandibular, preauricular or  posterior auricular adenopathy.     Left side of head: No submandibular, preauricular or posterior auricular adenopathy.     Cervical: No cervical adenopathy.     Right cervical: No superficial or deep cervical adenopathy.    Left cervical: No superficial or deep cervical adenopathy.  Skin:    General: Skin is warm and dry.     Coloration: Skin is not pale.     Findings: No abrasion, bruising, ecchymosis, erythema, lesion or rash.     Nails: There is no clubbing.  Neurological:     Mental Status: She is alert and oriented to person, place, and time.     Sensory: No sensory deficit.     Coordination: Coordination normal.     Gait: Gait normal.  Psychiatric:        Attention and Perception: She is attentive.        Mood and Affect: Mood normal.        Speech: Speech normal.        Behavior: Behavior normal. Behavior is cooperative.        Thought Content: Thought content normal.        Judgment: Judgment normal.           Assessment & Plan:   Assessment and Plan    Mycobacterium abscessus infection Mycobacterium abscessus identified from  bronchoscopy culture.  Not meeting criteria for infection. Organism sensitive to multiple antibiotics for an M abscessus species.. So if we have to treat her we can do so  - Monitor for persistent cough, weight loss, or night sweats. - Avoid treatment unless symptoms worsen significantly. - Follow-up in 3 months. - Contact clinic if symptoms worsen persistently. --AVOID Macrolide therapy as this is typically cornerstone to effective rx of mycobacteria abscessus  Small cell lung cancer Undergoing immunotherapy every 28 days. Shortness of breath and coughing likely due to irritants. Oxygen saturation improved with treatment. - Continue current immunotherapy regimen. - Monitor respiratory symptoms and avoid exposure to irritants.       I have personally spent 28 minutes involved in face-to-face and non-face-to-face activities for this patient on the day of the visit. Professional time spent includes the following activities: Preparing to see the patient (review of tests), Obtaining and/or reviewing separately obtained history (admission/discharge record), Performing a medically appropriate examination and/or evaluation , Ordering medications/tests/procedures, referring and communicating with other health care professionals, Documenting clinical information in the EMR, Independently interpreting results (not separately reported), Communicating results to the patient/family/caregiver, Counseling and educating the patient/family/caregiver and Care coordination (not separately reported).

## 2023-06-08 ENCOUNTER — Ambulatory Visit: Payer: Self-pay | Admitting: Infectious Disease

## 2023-06-08 ENCOUNTER — Encounter: Payer: Self-pay | Admitting: Infectious Disease

## 2023-06-08 ENCOUNTER — Other Ambulatory Visit: Payer: Self-pay

## 2023-06-08 VITALS — BP 152/90 | HR 98 | Temp 97.9°F | Ht 64.0 in | Wt 104.0 lb

## 2023-06-08 DIAGNOSIS — A319 Mycobacterial infection, unspecified: Secondary | ICD-10-CM | POA: Diagnosis not present

## 2023-06-08 DIAGNOSIS — C7801 Secondary malignant neoplasm of right lung: Secondary | ICD-10-CM

## 2023-06-08 DIAGNOSIS — C7802 Secondary malignant neoplasm of left lung: Secondary | ICD-10-CM

## 2023-06-08 DIAGNOSIS — J4540 Moderate persistent asthma, uncomplicated: Secondary | ICD-10-CM

## 2023-06-09 ENCOUNTER — Other Ambulatory Visit: Payer: Self-pay

## 2023-06-14 ENCOUNTER — Ambulatory Visit (HOSPITAL_COMMUNITY)
Admission: RE | Admit: 2023-06-14 | Discharge: 2023-06-14 | Disposition: A | Discharge status: Home / Self Care | Source: Ambulatory Visit | Attending: Oncology | Admitting: Oncology

## 2023-06-14 DIAGNOSIS — J320 Chronic maxillary sinusitis: Secondary | ICD-10-CM | POA: Diagnosis not present

## 2023-06-14 DIAGNOSIS — C7801 Secondary malignant neoplasm of right lung: Secondary | ICD-10-CM | POA: Insufficient documentation

## 2023-06-14 DIAGNOSIS — C3490 Malignant neoplasm of unspecified part of unspecified bronchus or lung: Secondary | ICD-10-CM | POA: Diagnosis not present

## 2023-06-14 DIAGNOSIS — C7802 Secondary malignant neoplasm of left lung: Secondary | ICD-10-CM | POA: Diagnosis not present

## 2023-06-14 MED ORDER — GADOBUTROL 1 MMOL/ML IV SOLN
5.0000 mL | Freq: Once | INTRAVENOUS | Status: AC | PRN
  Administered 2023-06-14: 5 mL via INTRAVENOUS

## 2023-06-16 ENCOUNTER — Inpatient Hospital Stay: Admitting: Oncology

## 2023-06-16 ENCOUNTER — Inpatient Hospital Stay

## 2023-06-16 ENCOUNTER — Inpatient Hospital Stay: Admitting: Dietician

## 2023-06-20 ENCOUNTER — Other Ambulatory Visit: Payer: Self-pay | Admitting: Oncology

## 2023-06-20 DIAGNOSIS — C7802 Secondary malignant neoplasm of left lung: Secondary | ICD-10-CM

## 2023-06-20 DIAGNOSIS — C7801 Secondary malignant neoplasm of right lung: Secondary | ICD-10-CM

## 2023-06-22 ENCOUNTER — Inpatient Hospital Stay

## 2023-06-22 ENCOUNTER — Inpatient Hospital Stay: Attending: Oncology | Admitting: Oncology

## 2023-06-22 ENCOUNTER — Inpatient Hospital Stay: Attending: Oncology

## 2023-06-22 VITALS — BP 128/71 | HR 84 | Resp 19

## 2023-06-22 VITALS — BP 137/89 | Temp 99.2°F | Resp 18 | Wt 103.0 lb

## 2023-06-22 DIAGNOSIS — C7801 Secondary malignant neoplasm of right lung: Secondary | ICD-10-CM

## 2023-06-22 DIAGNOSIS — Z5112 Encounter for antineoplastic immunotherapy: Secondary | ICD-10-CM | POA: Insufficient documentation

## 2023-06-22 DIAGNOSIS — C3490 Malignant neoplasm of unspecified part of unspecified bronchus or lung: Secondary | ICD-10-CM | POA: Diagnosis not present

## 2023-06-22 DIAGNOSIS — C7802 Secondary malignant neoplasm of left lung: Secondary | ICD-10-CM

## 2023-06-22 DIAGNOSIS — Z79620 Long term (current) use of immunosuppressive biologic: Secondary | ICD-10-CM | POA: Insufficient documentation

## 2023-06-22 DIAGNOSIS — A319 Mycobacterial infection, unspecified: Secondary | ICD-10-CM | POA: Insufficient documentation

## 2023-06-22 DIAGNOSIS — Z95828 Presence of other vascular implants and grafts: Secondary | ICD-10-CM

## 2023-06-22 DIAGNOSIS — F17210 Nicotine dependence, cigarettes, uncomplicated: Secondary | ICD-10-CM

## 2023-06-22 LAB — COMPREHENSIVE METABOLIC PANEL WITH GFR
ALT: 15 U/L (ref 0–44)
AST: 14 U/L — ABNORMAL LOW (ref 15–41)
Albumin: 3.6 g/dL (ref 3.5–5.0)
Alkaline Phosphatase: 59 U/L (ref 38–126)
Anion gap: 9 (ref 5–15)
BUN: 19 mg/dL (ref 8–23)
CO2: 22 mmol/L (ref 22–32)
Calcium: 9.1 mg/dL (ref 8.9–10.3)
Chloride: 105 mmol/L (ref 98–111)
Creatinine, Ser: 0.86 mg/dL (ref 0.44–1.00)
GFR, Estimated: >60 mL/min (ref >60)
Glucose, Bld: 102 mg/dL — ABNORMAL HIGH (ref 70–99)
Potassium: 3.6 mmol/L (ref 3.5–5.1)
Sodium: 136 mmol/L (ref 135–145)
Total Bilirubin: 0.6 mg/dL (ref 0.0–1.2)
Total Protein: 6.7 g/dL (ref 6.5–8.1)

## 2023-06-22 LAB — CBC WITH DIFFERENTIAL/PLATELET
Abs Immature Granulocytes: 0.03 10*3/uL (ref 0.00–0.07)
Basophils Absolute: 0 10*3/uL (ref 0.0–0.1)
Basophils Relative: 1 %
Eosinophils Absolute: 0.1 10*3/uL (ref 0.0–0.5)
Eosinophils Relative: 2 %
HCT: 35.7 % — ABNORMAL LOW (ref 36.0–46.0)
Hemoglobin: 11.2 g/dL — ABNORMAL LOW (ref 12.0–15.0)
Immature Granulocytes: 1 %
Lymphocytes Relative: 19 %
Lymphs Abs: 1 10*3/uL (ref 0.7–4.0)
MCH: 30.2 pg (ref 26.0–34.0)
MCHC: 31.4 g/dL (ref 30.0–36.0)
MCV: 96.2 fL (ref 80.0–100.0)
Monocytes Absolute: 0.6 10*3/uL (ref 0.1–1.0)
Monocytes Relative: 12 %
Neutro Abs: 3.5 10*3/uL (ref 1.7–7.7)
Neutrophils Relative %: 65 %
Platelets: 356 10*3/uL (ref 150–400)
RBC: 3.71 MIL/uL — ABNORMAL LOW (ref 3.87–5.11)
RDW: 16 % — ABNORMAL HIGH (ref 11.5–15.5)
WBC: 5.3 10*3/uL (ref 4.0–10.5)
nRBC: 0 % (ref 0.0–0.2)

## 2023-06-22 LAB — TSH: TSH: 1.23 u[IU]/mL (ref 0.350–4.500)

## 2023-06-22 LAB — MAGNESIUM: Magnesium: 2.1 mg/dL (ref 1.7–2.4)

## 2023-06-22 MED ORDER — HEPARIN SOD (PORK) LOCK FLUSH 100 UNIT/ML IV SOLN
500.0000 [IU] | Freq: Once | INTRAVENOUS | Status: AC | PRN
  Administered 2023-06-22: 500 [IU]

## 2023-06-22 MED ORDER — SODIUM CHLORIDE 0.9 % IV SOLN: INTRAVENOUS | Status: DC

## 2023-06-22 MED ORDER — SODIUM CHLORIDE 0.9% FLUSH
10.0000 mL | INTRAVENOUS | Status: DC | PRN
  Administered 2023-06-22: 10 mL via INTRAVENOUS

## 2023-06-22 MED ORDER — SODIUM CHLORIDE 0.9 % IV SOLN
1500.0000 mg | Freq: Once | INTRAVENOUS | Status: AC
  Administered 2023-06-22: 1500 mg via INTRAVENOUS
  Filled 2023-06-22: qty 30

## 2023-06-22 NOTE — Progress Notes (Signed)
 Patients port flushed without difficulty.  Good blood return noted with no bruising or swelling noted at site.  Patient remains accessed for treatment.

## 2023-06-22 NOTE — Progress Notes (Signed)
 Patient Care Team: Fredick Jarred, PA-C as PCP - General (Family Medicine) Loyde Rule, MD as PCP - Cardiology (Cardiology) Eduardo Grade, MD as Medical Oncologist (Medical Oncology) Gerhard Knuckles, RN as Oncology Nurse Navigator (Medical Oncology)  Clinic Day:  06/24/2023  Referring physician: Fredick Jarred, PA-C   CHIEF COMPLAINT:  CC: Stage IVA Small cell lug carcinoma    ASSESSMENT & PLAN:   Assessment & Plan:  Virginia Powell  is a 73 y.o. 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 Treatment response scan showed new lung nodules.  Patient was evaluated for clinical trial at [facility] (Dr.Patel) prior to the biopsy.  Bronchoscopy with biopsy of the new lung lesions showed atypical cells and no evidence of malignancy.  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  -Discussed the patient's case at lung tumor board at diagnosis, consensus to treat it like a limited stage small cell lung cancer with chemoRT -CT obtained prior to start of durvalumab  showed some new nodules, unknown at this time if it is infection versus carcinoma. -Started on maintenance durvalumab .  Patient tolerating treatment well.  Cycle 3-day 1 today.  Reports no rash, diarrhea, shortness of breath.  Labs and physical exam stable today.  Continue with treatment today. -Will obtain a CT scan after fourth cycle to check for response and status of the new lung nodules reported on previous CT scan. -Continue to follow with ID for Mycobacterium abscessus. - Recent MRI brain with no evidence of disease.  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  Was seen by ID and currently on observation no active treatment.  Patient reports occasional cough and shortness of breath during cough.  -Continue to follow with ID  Smokes cigarettes Patient is an avid smoker with history of smoking 2 packs/day until recently Patient continues to smoke  -Discussed the risks of smoking including faster progression of cancer, inflammation causing difficulties with chemotherapy and radiation -Offered help to quit smoking, patient is reluctant at this time but stated that she has cut down a lot   The patient understands the plans discussed today and is in agreement with them.  She knows to contact our office if she develops concerns prior to her next appointment.  I provided 30 minutes of face-to-face time during this encounter and > 50% was spent counseling as documented under my assessment and plan.    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 Growing solid nodule of the right lower lobe measuring 8 mm and new irregular solid nodule of the left lower lobe measuring 9.3 mm. Lung-RADS 4B, suspicious. Additional imaging evaluation or consultation with Pulmonology or Thoracic Surgery recommended.   09/09/2022 PET scan   IMPRESSION: 1. Hypermetabolic right hilar adenopathy and bilateral pulmonary nodules, findings indicative of synchronous primary bronchogenic carcinomas. No evidence of distant metastatic disease. 2. Vague slight thickening of lateral breast  soft tissue with metabolism just below blood pool. Consider mammographic correlation, as clinically indicated. 3. Slight marginal irregularity of the liver raises suspicion for cirrhosis. 4.  Bilateral adrenal adenomas.   10/02/2022 Imaging   CT chest without contrast: IMPRESSION: 1. No significant change in the appearance of tracer avid nodules within the medial right lower lobe and superior segment of left lower lobe. 2. Stable appearance of tracer avid nodal conglomeration noted within the right hilum. Which is concerning for nodal metastasis. 3. Stable appearance of bilateral adrenal adenomas. No follow-up imaging recommended.   10/04/2022 Pathology Results   A. LUNG, LLL, FINE NEEDLE ASPIRATION:  - No malignant cells identified  - Granulomatous inflammation   B. LUNG, LLL, BRUSHING:  - No malignant cells identified  - Granulomatous inflammation D. LUNG, RLL, FINE NEEDLE ASPIRATION:  - No malignant cells identified   E. 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 Interval enlargement of the superior segment left lower lobe lesion with stable hypermetabolism. 2. Stable 9 mm medial right lower lobe pulmonary nodule with slightly increased SUV max. 3. Persistent hypermetabolic right hilar adenopathy. 4. New 10 mm sub solid lesion in the left lower lobe with SUV max of 2.14. This is likely inflammatory. Attention on future studies is suggested. 5. Stable 14 mm linear density in the right lateral breast with low level FDG uptake. An indolent breast cancer is possible. 6. No findings for abdominal/pelvic metastatic disease or osseous metastatic disease. 7. Stable benign bilateral adrenal gland adenomas.   11/22/2022 Imaging   MRI Brain: No evidence of intracranial metastatic disease.     11/30/2022 - 02/04/2023 Chemotherapy   Patient is on Treatment Plan : LUNG SMALL CELL Cisplatin  (80) D1 + Etoposide  (100) D1-3 q21d      12/13/2022 - 01/07/2023 Radiation Therapy   RT with Dr.Morris at [facility], [facility]    02/17/2023 Imaging   CT CAP: IMPRESSION: CHEST:   1. New bilateral pulmonary nodules. 2. Interval decrease in size of RIGHT hilar lymph nodes. 3. Decrease in size of previously described pulmonary nodules.   PELVIS:   1. No evidence of metastatic disease in the abdomen pelvis. 2. Stable benign adrenal adenomas. 3. Sigmoid diverticulosis without diverticulitis.   03/17/2023 Imaging   MRI brain:  No evidence of metastatic disease.   03/29/2023 Procedure   Bronchoscopy with biopsy:  Pathology: Atypical cells AFB culture: Positive for Mycobacterium   04/19/2023 Imaging   CT CAP:  Lungs/pleura: New bilobed nodular lesion in the posterior left lower lobe measures 1.5 x 0.7 cm on image 42, series 2 there are 2 new 3-4 mm left lower lobe pulmonary nodules on image 46, series 2. There is a new 2-3 mm posterior left lower lobe pulmonary nodule on image 47, series 2. Previously seen lingular nodule adjacent to the fissure is nearly resolved. 9 x 6 mm right upper lobe nodule image 15, series 2 is essentially new. Increased size of right upper lobe nodule on image 18, series 2, now measuring 9 x 7 mm (previously this measured 7 x 5 mm). Increased size of right upper lobe lesion on image 22, series 2 which now measures 2.5 x 1.5 cm (previously this measured 0.9 x 0.7 cm). Cluster of superior right lower lobe nodules on images 22-24, series 2 have decreased in size. 2.2  x 1.5 cm lesion on image 27, series 2 in the right lower lobe previously measured 2.4 x 1.6 cm. There is new pleural-based lesion in the posteromedial right lower lobe measuring 3.6 x 2.1 cm on image 25, series 2. Multiple new right lower lobe and right middle lobe nodules have a tree-in-bud appearance suggesting an infectious or inflammatory process. No pleural effusion. Patent central airways. Moderate upper lung predominant emphysema.  IMPRESSION: *Mixed response to therapy with some pulmonary nodules increased in size, some  decreased in size, and some resolved. *Multiple new right lower lobe and right middle lobe nodules have a tree-in-bud appearance suggesting an infectious or inflammatory process. Consider short-term interval follow-up after completion of therapy to ensure resolution. *No evidence of metastatic disease in the abdomen or pelvis.   04/20/2023 -  Chemotherapy   Patient is on Treatment Plan : LUNG NSCLC Durvalumab  (1500) q28d     06/14/2023 Imaging   MRI brain:  IMPRESSION: 1. No evidence of intracranial metastasis. 2. Unchanged background of mild chronic small vessel disease.       Current Treatment: Completed Chemo RT with Cisplatin  + Etoposide . On maintenance Durvalumab    INTERVAL HISTORY:  Virginia Powell is here today for follow up.  Patient tolerated previous cycle of durvalumab  well.  Reports no rash, shortness of breath, diarrhea at this time.  She maintains a good appetite and often wakes up at night feeling hungry, prompting her to have a snack. She focuses on consuming meals rich in protein and iron and continues to take iron supplements. Her bowel movements are normal, though she experiences four to five per day if she eats a lot, with darker stools attributed to the iron supplementation.  She experienced a headache upon waking this morning, which resolved on its own. No current headaches, diarrhea, or rash. She experiences occasional shortness of breath, particularly when not in fresh air, [place]  She remains anemic and continues with iron supplementation. She recalls a past episode of food poisoning from trout fish, which has made her hesitant to try certain foods again.  Her hair is growing quickly, and she plans to wash and style it later. She is satisfied with its current length and texture.   I have reviewed the past medical history, past surgical history, social history and family history with the patient and they are unchanged from  previous note.  ALLERGIES:  is allergic to morphine and codeine.  MEDICATIONS:  Current Outpatient Medications  Medication Sig Dispense Refill   albuterol (PROVENTIL) (2.5 MG/3ML) 0.083% nebulizer solution SMARTSIG:1 Vial(s) Via Nebulizer Every 4-6 Hours PRN     albuterol (VENTOLIN HFA) 108 (90 Base) MCG/ACT inhaler SMARTSIG:2 Puff(s) By Mouth Every 4 Hours     atorvastatin (LIPITOR) 40 MG tablet Take 40 mg by mouth daily.     benzonatate (TESSALON) 100 MG capsule Take 100 mg by mouth 3 (three) times daily as needed for cough.     ciprofloxacin  (CIPRO ) 500 MG tablet Take 1 tablet (500 mg total) by mouth 2 (two) times daily. 20 tablet 0   CISPLATIN  IV Inject into the vein every 21 ( twenty-one) days.     clindamycin (CLEOCIN) 300 MG capsule Take 300 mg by mouth 3 (three) times daily.     ETOPOSIDE  IV Inject into the vein every 21 ( twenty-one) days.     ferrous sulfate 324 MG TBEC Take 324 mg by mouth.     fexofenadine (ALLEGRA) 180 MG tablet  Take 180 mg by mouth daily as needed for allergies or rhinitis.     fluticasone (FLONASE) 50 MCG/ACT nasal spray Place into both nostrils daily as needed.     hydrochlorothiazide (HYDRODIURIL) 12.5 MG tablet Take 12.5 mg by mouth daily.     hydrOXYzine (ATARAX) 25 MG tablet Take 1 tablet by mouth at bedtime as needed.     lidocaine -prilocaine  (EMLA ) cream Apply a quarter-sized amount to port a cath site and cover with plastic wrap 1 hour prior to infusion appointments 30 g 3   lidocaine -prilocaine  (EMLA ) cream Apply to affected area once 30 g 3   loratadine (CLARITIN) 10 MG tablet Take 10 mg by mouth daily as needed for allergies.     olmesartan  (BENICAR ) 40 MG tablet Take 1 tablet (40 mg total) by mouth daily. 30 tablet 0   ondansetron  (ZOFRAN ) 8 MG tablet Take 1 tablet (8 mg total) by mouth every 8 (eight) hours as needed for nausea or vomiting. 30 tablet 1   prochlorperazine  (COMPAZINE ) 10 MG tablet Take 1 tablet (10 mg total) by mouth every 6 (six)  hours as needed for nausea or vomiting. 30 tablet 1   rizatriptan (MAXALT-MLT) 10 MG disintegrating tablet Take 10 mg by mouth as needed for migraine. May repeat in 2 hours if needed     sucralfate  (CARAFATE ) 1 GM/10ML suspension Take 10 mLs (1 g total) by mouth 4 (four) times daily -  with meals and at bedtime. 420 mL 0   TRELEGY ELLIPTA 100-62.5-25 MCG/ACT AEPB Inhale 1 puff into the lungs daily.     No current facility-administered medications for this visit.     REVIEW OF SYSTEMS:   Constitutional: Denies fevers, chills or abnormal weight loss Eyes: Denies blurriness of vision Ears, nose, mouth, throat, and face: Denies mucositis or sore throat Respiratory: Denies cough, dyspnea or wheezes Cardiovascular: Denies palpitation, chest discomfort or lower extremity swelling Gastrointestinal:  Denies nausea, heartburn or change in bowel habits Skin: Denies abnormal skin rashes Lymphatics: Denies new lymphadenopathy or easy bruising Neurological:Denies numbness, tingling or new weaknesses Behavioral/Psych: Mood is stable, no new changes  All other systems were reviewed with the patient and are negative.   VITALS:  There were no vitals taken for this visit.  Wt Readings from Last 3 Encounters:  06/22/23 102 lb 15.3 oz (46.7 kg)  06/08/23 104 lb (47.2 kg)  05/19/23 104 lb 15 oz (47.6 kg)    Performance status (ECOG): 0 - Asymptomatic  PHYSICAL EXAM:   GENERAL:alert, no distress and comfortable SKIN: skin color, texture, turgor are normal, no rashes or significant lesions LUNGS: clear to auscultation and percussion with normal breathing effort HEART: regular rate & rhythm and no murmurs and no lower extremity edema ABDOMEN:abdomen soft, non-tender and normal bowel sounds Musculoskeletal:no cyanosis of digits and no clubbing  NEURO: alert & oriented x 3 with fluent speech  LABORATORY DATA:  I have reviewed the data as listed    Component Value Date/Time   NA 136 06/22/2023  1153   K 3.6 06/22/2023 1153   CL 105 06/22/2023 1153   CO2 22 06/22/2023 1153   GLUCOSE 102 (H) 06/22/2023 1153   BUN 19 06/22/2023 1153   CREATININE 0.86 06/22/2023 1153   CALCIUM 9.1 06/22/2023 1153   PROT 6.7 06/22/2023 1153   ALBUMIN 3.6 06/22/2023 1153   AST 14 (L) 06/22/2023 1153   ALT 15 06/22/2023 1153   ALKPHOS 59 06/22/2023 1153   BILITOT 0.6 06/22/2023 1153  GFRNONAA >60 06/22/2023 1153    Lab Results  Component Value Date   WBC 5.3 06/22/2023   NEUTROABS 3.5 06/22/2023   HGB 11.2 (L) 06/22/2023   HCT 35.7 (L) 06/22/2023   MCV 96.2 06/22/2023   PLT 356 06/22/2023      Chemistry      Component Value Date/Time   NA 136 06/22/2023 1153   K 3.6 06/22/2023 1153   CL 105 06/22/2023 1153   CO2 22 06/22/2023 1153   BUN 19 06/22/2023 1153   CREATININE 0.86 06/22/2023 1153      Component Value Date/Time   CALCIUM 9.1 06/22/2023 1153   ALKPHOS 59 06/22/2023 1153   AST 14 (L) 06/22/2023 1153   ALT 15 06/22/2023 1153   BILITOT 0.6 06/22/2023 1153      Latest Reference Range & Units 04/13/23 12:35  Iron 28 - 170 ug/dL 56  UIBC ug/dL 161  TIBC 096 - 045 ug/dL 409  Saturation Ratios 10.4 - 31.8 % 17  Ferritin 11 - 307 ng/mL 103   RADIOGRAPHIC STUDIES:  I have personally reviewed the radiological images as listed and agreed with the findings in the report.   MR Brain W Wo Contrast EXAM: MRI BRAIN WITH AND WITHOUT CONTRAST 06/14/2023 10:11:05 AM  TECHNIQUE: Multiplanar multisequence MRI of the head/brain was performed with and without the administration of intravenous contrast.  COMPARISON: MRI brain 03/17/2023.  CLINICAL HISTORY: Small cell lung cancer (SCLC), monitor.  FINDINGS:  BRAIN AND VENTRICLES: No acute infarct. No acute intracranial hemorrhage. No mass or abnormal enhancement. No midline shift. No hydrocephalus. The sella is unremarkable. Normal flow voids. Unchanged background of mild chronic small vessel disease.  ORBITS: No acute  abnormality.  SINUSES AND MASTOIDS: Mild mucosal disease in the left maxillary sinus and bilateral sphenoid sinuses.  BONES AND SOFT TISSUES: Normal bone marrow signal. No acute soft tissue abnormality.  IMPRESSION: 1. No evidence of intracranial metastasis. 2. Unchanged background of mild chronic small vessel disease.  Electronically signed by: Audra Blend 06/20/2023 03:07 PM EDT RP Workstation: [id number]

## 2023-06-22 NOTE — Patient Instructions (Signed)
 VISIT SUMMARY:  Today, you came in for your third cycle of immunotherapy. You reported a good appetite, occasional shortness of breath, and a resolved headache from this morning. You are continuing with iron supplements for anemia and have no significant side effects from the treatment.  YOUR PLAN:  -CANCER UNDER ACTIVE TREATMENT: You are currently undergoing immunotherapy for cancer. Recent scans suggest that there are no signs of cancer progression, but rather changes related to an infection. You will continue with your immunotherapy today, and a CT scan will be ordered next month after your fourth cycle. Please report any symptoms such as diarrhea, skin rash, or shortness of breath immediately.  -ANEMIA: Your anemia is likely related to your cancer. You are managing it with iron supplements, which can cause darker stools. You should continue taking your iron supplements and focus on eating foods rich in protein and iron. We will check your iron levels at your next visit.  INSTRUCTIONS:  Please continue with your immunotherapy and iron supplements as discussed. We will reassess your condition after your fourth cycle of immunotherapy and check your iron levels at your next visit. Report any symptoms such as diarrhea, skin rash, or shortness of breath immediately.

## 2023-06-22 NOTE — Patient Instructions (Signed)
 [facility] [place] - A DEPT OF [facility]  Discharge Instructions: Thank you for choosing [facility] to provide your oncology and hematology care.  If you have a lab appointment with the Cancer Center - please note that after April 8th, 2024, all labs will be drawn in the cancer center.  You do not have to check in or register with the main entrance as you have in the past but will complete your check-in in the cancer center.  Wear comfortable clothing and clothing appropriate for easy access to any Portacath or PICC line.   We strive to give you quality time with your provider. You may need to reschedule your appointment if you arrive late (15 or more minutes).  Arriving late affects you and other patients whose appointments are after yours.  Also, if you miss three or more appointments without notifying the office, you may be dismissed from the clinic at the provider's discretion.      For prescription refill requests, have your pharmacy contact our office and allow 72 hours for refills to be completed.    Today you received the following chemotherapy and/or immunotherapy agents Imfiniz   To help prevent nausea and vomiting after your treatment, we encourage you to take your nausea medication as directed.  BELOW ARE SYMPTOMS THAT SHOULD BE REPORTED IMMEDIATELY: *FEVER GREATER THAN 100.4 F (38 C) OR HIGHER *CHILLS OR SWEATING *NAUSEA AND VOMITING THAT IS NOT CONTROLLED WITH YOUR NAUSEA MEDICATION *UNUSUAL SHORTNESS OF BREATH *UNUSUAL BRUISING OR BLEEDING *URINARY PROBLEMS (pain or burning when urinating, or frequent urination) *BOWEL PROBLEMS (unusual diarrhea, constipation, pain near the anus) TENDERNESS IN MOUTH AND THROAT WITH OR WITHOUT PRESENCE OF ULCERS (sore throat, sores in mouth, or a toothache) UNUSUAL RASH, SWELLING OR PAIN  UNUSUAL VAGINAL DISCHARGE OR ITCHING   Items with * indicate a potential emergency and should be followed up as  soon as possible or go to the Emergency Department if any problems should occur.  Please show the CHEMOTHERAPY ALERT CARD or IMMUNOTHERAPY ALERT CARD at check-in to the Emergency Department and triage nurse.  Should you have questions after your visit or need to cancel or reschedule your appointment, please contact [facility] [place] - A DEPT OF [facility] [phone]  and follow the prompts.  Office hours are 8:00 a.m. to 4:30 p.m. Monday - Friday. Please note that voicemails left after 4:00 p.m. may not be returned until the following business day.  We are closed weekends and major holidays. You have access to a nurse at all times for urgent questions. Please call the main number to the clinic [phone] and follow the prompts.  For any non-urgent questions, you may also contact your provider using MyChart. We now offer e-Visits for anyone 18 and older to request care online for non-urgent symptoms. For details visit [email or web address].   Also download the MyChart app! Go to the app store, search "MyChart", open the app, select [place], and log in with your MyChart username and password.

## 2023-06-22 NOTE — Progress Notes (Signed)
 Patient tolerated chemotherapy with no complaints voiced.  Side effects with management reviewed with understanding verbalized.  Port site clean and dry with no bruising or swelling noted at site.  Good blood return noted before and after administration of chemotherapy.  Band aid applied.  Patient left in satisfactory condition with VSS and no s/s of distress noted. All follow ups as scheduled.   Virginia Powell [employer]

## 2023-06-23 LAB — T4: T4, Total: 7.9 ug/dL (ref 4.5–12.0)

## 2023-06-24 ENCOUNTER — Encounter: Payer: Self-pay | Admitting: Oncology

## 2023-06-24 NOTE — Assessment & Plan Note (Signed)
-  Patient is an avid smoker with history of smoking 2 packs/day until recently -Patient continues to smoke -Discussed the risks of smoking including faster progression of cancer, inflammation causing difficulties with chemotherapy and radiation -Offered help to quit smoking, patient is reluctant at this time but stated that she has cut down a lot

## 2023-06-24 NOTE — Assessment & Plan Note (Addendum)
 Oncology history below. 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 Treatment response scan showed new lung nodules.  Patient was evaluated for clinical trial at [facility] (Dr.Patel) prior to the biopsy.  Bronchoscopy with biopsy of the new lung lesions showed atypical cells and no evidence of malignancy.  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  -Discussed the patient's case at lung tumor board at diagnosis, consensus to treat it like a limited stage small cell lung cancer with chemoRT -CT obtained prior to start of durvalumab  showed some new nodules, unknown at this time if it is infection versus carcinoma. -Started on maintenance durvalumab .  Patient tolerating treatment well.  Cycle 3-day 1 today.  Reports no rash, diarrhea, shortness of breath.  Labs and physical exam stable today.  Continue with treatment today. -Will obtain a CT scan after fourth cycle to check for response and status of the new lung nodules reported on previous CT scan. -Continue to follow with ID for Mycobacterium abscessus. - Recent MRI brain with no evidence of disease.  Will obtain MRI brain every 3 months  Return to clinic in 8 weeks with labs prior to next cycle of durvalumab 

## 2023-06-24 NOTE — Assessment & Plan Note (Signed)
 Patient has Mycobacterium abscessus infection postchemotherapy.  Was seen by ID and currently on observation no active treatment.  Patient reports occasional cough and shortness of breath during cough. -Continue to follow with ID

## 2023-07-06 DIAGNOSIS — J441 Chronic obstructive pulmonary disease with (acute) exacerbation: Secondary | ICD-10-CM | POA: Diagnosis not present

## 2023-07-06 DIAGNOSIS — Z681 Body mass index (BMI) 19 or less, adult: Secondary | ICD-10-CM | POA: Diagnosis not present

## 2023-07-14 ENCOUNTER — Inpatient Hospital Stay

## 2023-07-14 ENCOUNTER — Inpatient Hospital Stay: Admitting: Dietician

## 2023-07-14 ENCOUNTER — Inpatient Hospital Stay: Admitting: Oncology

## 2023-07-15 DIAGNOSIS — A319 Mycobacterial infection, unspecified: Secondary | ICD-10-CM | POA: Diagnosis not present

## 2023-07-15 DIAGNOSIS — C3490 Malignant neoplasm of unspecified part of unspecified bronchus or lung: Secondary | ICD-10-CM | POA: Diagnosis not present

## 2023-07-15 DIAGNOSIS — J449 Chronic obstructive pulmonary disease, unspecified: Secondary | ICD-10-CM | POA: Diagnosis not present

## 2023-07-15 DIAGNOSIS — Z681 Body mass index (BMI) 19 or less, adult: Secondary | ICD-10-CM | POA: Diagnosis not present

## 2023-07-20 ENCOUNTER — Inpatient Hospital Stay: Admitting: Oncology

## 2023-07-20 ENCOUNTER — Inpatient Hospital Stay: Attending: Oncology

## 2023-07-20 ENCOUNTER — Inpatient Hospital Stay

## 2023-07-20 VITALS — BP 109/72 | HR 104 | Temp 99.0°F | Resp 20

## 2023-07-20 DIAGNOSIS — F17210 Nicotine dependence, cigarettes, uncomplicated: Secondary | ICD-10-CM | POA: Diagnosis not present

## 2023-07-20 DIAGNOSIS — C3490 Malignant neoplasm of unspecified part of unspecified bronchus or lung: Secondary | ICD-10-CM | POA: Insufficient documentation

## 2023-07-20 DIAGNOSIS — Z923 Personal history of irradiation: Secondary | ICD-10-CM | POA: Insufficient documentation

## 2023-07-20 DIAGNOSIS — Z5112 Encounter for antineoplastic immunotherapy: Secondary | ICD-10-CM | POA: Diagnosis not present

## 2023-07-20 DIAGNOSIS — C7801 Secondary malignant neoplasm of right lung: Secondary | ICD-10-CM | POA: Insufficient documentation

## 2023-07-20 DIAGNOSIS — C7802 Secondary malignant neoplasm of left lung: Secondary | ICD-10-CM | POA: Insufficient documentation

## 2023-07-20 LAB — MAGNESIUM: Magnesium: 1.8 mg/dL (ref 1.7–2.4)

## 2023-07-20 LAB — COMPREHENSIVE METABOLIC PANEL WITH GFR
ALT: 21 U/L (ref 0–44)
AST: 19 U/L (ref 15–41)
Albumin: 3.3 g/dL — ABNORMAL LOW (ref 3.5–5.0)
Alkaline Phosphatase: 55 U/L (ref 38–126)
Anion gap: 9 (ref 5–15)
BUN: 29 mg/dL — ABNORMAL HIGH (ref 8–23)
CO2: 22 mmol/L (ref 22–32)
Calcium: 9 mg/dL (ref 8.9–10.3)
Chloride: 105 mmol/L (ref 98–111)
Creatinine, Ser: 0.97 mg/dL (ref 0.44–1.00)
GFR, Estimated: >60 mL/min (ref >60)
Glucose, Bld: 132 mg/dL — ABNORMAL HIGH (ref 70–99)
Potassium: 3.4 mmol/L — ABNORMAL LOW (ref 3.5–5.1)
Sodium: 136 mmol/L (ref 135–145)
Total Bilirubin: 0.6 mg/dL (ref 0.0–1.2)
Total Protein: 6.7 g/dL (ref 6.5–8.1)

## 2023-07-20 LAB — CBC WITH DIFFERENTIAL/PLATELET
Abs Immature Granulocytes: 0.08 10*3/uL — ABNORMAL HIGH (ref 0.00–0.07)
Basophils Absolute: 0 10*3/uL (ref 0.0–0.1)
Basophils Relative: 0 %
Eosinophils Absolute: 0 10*3/uL (ref 0.0–0.5)
Eosinophils Relative: 0 %
HCT: 36.2 % (ref 36.0–46.0)
Hemoglobin: 11.7 g/dL — ABNORMAL LOW (ref 12.0–15.0)
Immature Granulocytes: 1 %
Lymphocytes Relative: 10 %
Lymphs Abs: 1.2 10*3/uL (ref 0.7–4.0)
MCH: 31.4 pg (ref 26.0–34.0)
MCHC: 32.3 g/dL (ref 30.0–36.0)
MCV: 97.1 fL (ref 80.0–100.0)
Monocytes Absolute: 0.7 10*3/uL (ref 0.1–1.0)
Monocytes Relative: 6 %
Neutro Abs: 9.2 10*3/uL — ABNORMAL HIGH (ref 1.7–7.7)
Neutrophils Relative %: 83 %
Platelets: 437 10*3/uL — ABNORMAL HIGH (ref 150–400)
RBC: 3.73 MIL/uL — ABNORMAL LOW (ref 3.87–5.11)
RDW: 16 % — ABNORMAL HIGH (ref 11.5–15.5)
WBC: 11.1 10*3/uL — ABNORMAL HIGH (ref 4.0–10.5)
nRBC: 0 % (ref 0.0–0.2)

## 2023-07-20 MED ORDER — SODIUM CHLORIDE 0.9% FLUSH
10.0000 mL | INTRAVENOUS | Status: DC | PRN
  Administered 2023-07-20: 10 mL

## 2023-07-20 MED ORDER — POTASSIUM CHLORIDE CRYS ER 20 MEQ PO TBCR
40.0000 meq | EXTENDED_RELEASE_TABLET | Freq: Once | ORAL | Status: AC
  Administered 2023-07-20: 40 meq via ORAL
  Filled 2023-07-20: qty 2

## 2023-07-20 MED ORDER — SODIUM CHLORIDE 0.9 % IV SOLN: INTRAVENOUS | Status: DC

## 2023-07-20 MED ORDER — HEPARIN SOD (PORK) LOCK FLUSH 100 UNIT/ML IV SOLN
500.0000 [IU] | Freq: Once | INTRAVENOUS | Status: AC | PRN
  Administered 2023-07-20: 500 [IU]

## 2023-07-20 MED ORDER — SODIUM CHLORIDE 0.9% FLUSH
10.0000 mL | Freq: Once | INTRAVENOUS | Status: AC
  Administered 2023-07-20: 10 mL via INTRAVENOUS

## 2023-07-20 MED ORDER — SODIUM CHLORIDE 0.9 % IV SOLN
1500.0000 mg | Freq: Once | INTRAVENOUS | Status: AC
  Administered 2023-07-20: 1500 mg via INTRAVENOUS
  Filled 2023-07-20: qty 30

## 2023-07-20 NOTE — Patient Instructions (Signed)
 [facility] [place] - A DEPT OF [facility]  Discharge Instructions: Thank you for choosing [facility] to provide your oncology and hematology care.  If you have a lab appointment with the Cancer Center - please note that after April 8th, 2024, all labs will be drawn in the cancer center.  You do not have to check in or register with the main entrance as you have in the past but will complete your check-in in the cancer center.  Wear comfortable clothing and clothing appropriate for easy access to any Portacath or PICC line.   We strive to give you quality time with your provider. You may need to reschedule your appointment if you arrive late (15 or more minutes).  Arriving late affects you and other patients whose appointments are after yours.  Also, if you miss three or more appointments without notifying the office, you may be dismissed from the clinic at the provider's discretion.      For prescription refill requests, have your pharmacy contact our office and allow 72 hours for refills to be completed.    Today you received the following chemotherapy and/or immunotherapy agents Imfiniz   To help prevent nausea and vomiting after your treatment, we encourage you to take your nausea medication as directed.  BELOW ARE SYMPTOMS THAT SHOULD BE REPORTED IMMEDIATELY: *FEVER GREATER THAN 100.4 F (38 C) OR HIGHER *CHILLS OR SWEATING *NAUSEA AND VOMITING THAT IS NOT CONTROLLED WITH YOUR NAUSEA MEDICATION *UNUSUAL SHORTNESS OF BREATH *UNUSUAL BRUISING OR BLEEDING *URINARY PROBLEMS (pain or burning when urinating, or frequent urination) *BOWEL PROBLEMS (unusual diarrhea, constipation, pain near the anus) TENDERNESS IN MOUTH AND THROAT WITH OR WITHOUT PRESENCE OF ULCERS (sore throat, sores in mouth, or a toothache) UNUSUAL RASH, SWELLING OR PAIN  UNUSUAL VAGINAL DISCHARGE OR ITCHING   Items with * indicate a potential emergency and should be followed up as  soon as possible or go to the Emergency Department if any problems should occur.  Please show the CHEMOTHERAPY ALERT CARD or IMMUNOTHERAPY ALERT CARD at check-in to the Emergency Department and triage nurse.  Should you have questions after your visit or need to cancel or reschedule your appointment, please contact [facility] [place] - A DEPT OF [facility] [phone]  and follow the prompts.  Office hours are 8:00 a.m. to 4:30 p.m. Monday - Friday. Please note that voicemails left after 4:00 p.m. may not be returned until the following business day.  We are closed weekends and major holidays. You have access to a nurse at all times for urgent questions. Please call the main number to the clinic [phone] and follow the prompts.  For any non-urgent questions, you may also contact your provider using MyChart. We now offer e-Visits for anyone 18 and older to request care online for non-urgent symptoms. For details visit [email or web address].   Also download the MyChart app! Go to the app store, search "MyChart", open the app, select [place], and log in with your MyChart username and password.

## 2023-07-20 NOTE — Progress Notes (Signed)
 Patient tolerated chemotherapy with no complaints voiced.  Side effects with management reviewed with understanding verbalized.  Port site clean and dry with no bruising or swelling noted at site.  Good blood return noted before and after administration of chemotherapy.  Band aid applied.  Patient left in satisfactory condition with VSS and no s/s of distress noted. All follow ups as scheduled.   Venkat Ankney [employer]

## 2023-07-20 NOTE — Progress Notes (Signed)
 Labs reviewed, HR is 122, ok to treat today per Dr. Katragadda

## 2023-07-21 ENCOUNTER — Inpatient Hospital Stay: Admitting: Dietician

## 2023-07-25 ENCOUNTER — Telehealth: Payer: Self-pay | Admitting: Dietician

## 2023-07-25 ENCOUNTER — Inpatient Hospital Stay: Admitting: Dietician

## 2023-07-25 NOTE — Telephone Encounter (Signed)
Attempted to contact patient via telephone for nutrition follow-up. Left VM with request for return call. Contact information provided.  

## 2023-07-25 NOTE — Telephone Encounter (Signed)
 Nutrition Follow-up:  Pt with stage IV small cell lung cancer. Patient received first cisplatin /etoposide  10/15 concurrent with radiation. Final RT 11/25 under the care of Dr. Cipriano [facility]). Pt currently receiving durvalumab  q28d. Patient is under the care of Dr. Orvis Blare  Received return call from patient. She reports feeling better today. Patient has had an upper respiratory infection the last couple of weeks. She does not remember the last time that she felt that bad. Patient reports poor appetite during this time, however this has improved. Endorses associated wt loss down to 95 lb. Patient states she has gained 3 lbs back. She was consuming 2 Ensure Plus while sick. Now that appetite has improved, pt has daily. They are too expensive to drink more than one.   Medications: reviewed   Labs: 6/4 - K 3.4, glucose 132, BUN 29, albumin 3.3  Anthropometrics: Last wt 98 lb 5.2 oz on 6/4 (upper respiratory infection x 2wks per pt)  5/7 - 102 lb 15.3 oz  4/3 - 104 lb 15 oz    NUTRITION DIAGNOSIS: Food and nutrition related knowledge deficit improving   INTERVENTION:  Encourage high calorie high protein foods to promote wt gain Continue daily Ensure Plus/equivalent - will mail coupons     MONITORING, EVALUATION, GOAL: wt trends, intake    NEXT VISIT: To be scheduled as needed

## 2023-08-10 ENCOUNTER — Ambulatory Visit (HOSPITAL_COMMUNITY)
Admission: RE | Admit: 2023-08-10 | Discharge: 2023-08-10 | Disposition: A | Discharge status: Home / Self Care | Source: Ambulatory Visit | Attending: Oncology | Admitting: Oncology

## 2023-08-10 DIAGNOSIS — R918 Other nonspecific abnormal finding of lung field: Secondary | ICD-10-CM | POA: Diagnosis not present

## 2023-08-10 DIAGNOSIS — I700 Atherosclerosis of aorta: Secondary | ICD-10-CM | POA: Diagnosis not present

## 2023-08-10 DIAGNOSIS — K5730 Diverticulosis of large intestine without perforation or abscess without bleeding: Secondary | ICD-10-CM | POA: Diagnosis not present

## 2023-08-10 DIAGNOSIS — J439 Emphysema, unspecified: Secondary | ICD-10-CM | POA: Diagnosis not present

## 2023-08-10 DIAGNOSIS — C3490 Malignant neoplasm of unspecified part of unspecified bronchus or lung: Secondary | ICD-10-CM | POA: Diagnosis not present

## 2023-08-10 MED ORDER — IOHEXOL 300 MG/ML  SOLN
100.0000 mL | Freq: Once | INTRAMUSCULAR | Status: AC | PRN
  Administered 2023-08-10: 80 mL via INTRAVENOUS

## 2023-08-10 MED ORDER — IOHEXOL 9 MG/ML PO SOLN
ORAL | Status: AC
  Filled 2023-08-10: qty 1000

## 2023-08-10 MED ORDER — IOHEXOL 9 MG/ML PO SOLN: 500.0000 mL | ORAL | Status: AC

## 2023-08-17 ENCOUNTER — Ambulatory Visit: Admitting: Oncology

## 2023-08-17 ENCOUNTER — Ambulatory Visit

## 2023-08-17 ENCOUNTER — Other Ambulatory Visit

## 2023-08-22 ENCOUNTER — Other Ambulatory Visit: Payer: Self-pay | Admitting: Oncology

## 2023-08-22 DIAGNOSIS — C7801 Secondary malignant neoplasm of right lung: Secondary | ICD-10-CM

## 2023-08-23 ENCOUNTER — Other Ambulatory Visit: Payer: Self-pay

## 2023-08-24 ENCOUNTER — Inpatient Hospital Stay

## 2023-08-24 ENCOUNTER — Inpatient Hospital Stay: Admitting: Oncology

## 2023-08-24 ENCOUNTER — Inpatient Hospital Stay: Attending: Oncology

## 2023-08-24 VITALS — Wt 98.5 lb

## 2023-08-24 VITALS — BP 105/71 | HR 95 | Temp 97.1°F | Resp 18

## 2023-08-24 DIAGNOSIS — A318 Other mycobacterial infections: Secondary | ICD-10-CM | POA: Diagnosis not present

## 2023-08-24 DIAGNOSIS — Z79620 Long term (current) use of immunosuppressive biologic: Secondary | ICD-10-CM | POA: Insufficient documentation

## 2023-08-24 DIAGNOSIS — C7802 Secondary malignant neoplasm of left lung: Secondary | ICD-10-CM

## 2023-08-24 DIAGNOSIS — A319 Mycobacterial infection, unspecified: Secondary | ICD-10-CM

## 2023-08-24 DIAGNOSIS — F17210 Nicotine dependence, cigarettes, uncomplicated: Secondary | ICD-10-CM | POA: Insufficient documentation

## 2023-08-24 DIAGNOSIS — Z95828 Presence of other vascular implants and grafts: Secondary | ICD-10-CM

## 2023-08-24 DIAGNOSIS — D649 Anemia, unspecified: Secondary | ICD-10-CM | POA: Diagnosis not present

## 2023-08-24 DIAGNOSIS — Z5112 Encounter for antineoplastic immunotherapy: Secondary | ICD-10-CM | POA: Diagnosis not present

## 2023-08-24 DIAGNOSIS — C7801 Secondary malignant neoplasm of right lung: Secondary | ICD-10-CM

## 2023-08-24 DIAGNOSIS — Z923 Personal history of irradiation: Secondary | ICD-10-CM | POA: Diagnosis not present

## 2023-08-24 DIAGNOSIS — D6489 Other specified anemias: Secondary | ICD-10-CM | POA: Diagnosis not present

## 2023-08-24 DIAGNOSIS — E611 Iron deficiency: Secondary | ICD-10-CM | POA: Insufficient documentation

## 2023-08-24 LAB — CBC WITH DIFFERENTIAL/PLATELET
Abs Immature Granulocytes: 0.03 K/uL (ref 0.00–0.07)
Basophils Absolute: 0 K/uL (ref 0.0–0.1)
Basophils Relative: 0 %
Eosinophils Absolute: 0.1 K/uL (ref 0.0–0.5)
Eosinophils Relative: 1 %
HCT: 38.8 % (ref 36.0–46.0)
Hemoglobin: 12.6 g/dL (ref 12.0–15.0)
Immature Granulocytes: 0 %
Lymphocytes Relative: 16 %
Lymphs Abs: 1.1 K/uL (ref 0.7–4.0)
MCH: 32.2 pg (ref 26.0–34.0)
MCHC: 32.5 g/dL (ref 30.0–36.0)
MCV: 99.2 fL (ref 80.0–100.0)
Monocytes Absolute: 0.6 K/uL (ref 0.1–1.0)
Monocytes Relative: 9 %
Neutro Abs: 5.2 K/uL (ref 1.7–7.7)
Neutrophils Relative %: 74 %
Platelets: 365 K/uL (ref 150–400)
RBC: 3.91 MIL/uL (ref 3.87–5.11)
RDW: 15.9 % — ABNORMAL HIGH (ref 11.5–15.5)
WBC: 7 K/uL (ref 4.0–10.5)
nRBC: 0 % (ref 0.0–0.2)

## 2023-08-24 LAB — MAGNESIUM: Magnesium: 2.2 mg/dL (ref 1.7–2.4)

## 2023-08-24 LAB — COMPREHENSIVE METABOLIC PANEL WITH GFR
ALT: 19 U/L (ref 0–44)
AST: 19 U/L (ref 15–41)
Albumin: 4 g/dL (ref 3.5–5.0)
Alkaline Phosphatase: 50 U/L (ref 38–126)
Anion gap: 12 (ref 5–15)
BUN: 24 mg/dL — ABNORMAL HIGH (ref 8–23)
CO2: 21 mmol/L — ABNORMAL LOW (ref 22–32)
Calcium: 9.2 mg/dL (ref 8.9–10.3)
Chloride: 105 mmol/L (ref 98–111)
Creatinine, Ser: 0.88 mg/dL (ref 0.44–1.00)
GFR, Estimated: >60 mL/min (ref >60)
Glucose, Bld: 108 mg/dL — ABNORMAL HIGH (ref 70–99)
Potassium: 3.7 mmol/L (ref 3.5–5.1)
Sodium: 138 mmol/L (ref 135–145)
Total Bilirubin: 0.6 mg/dL (ref 0.0–1.2)
Total Protein: 7.3 g/dL (ref 6.5–8.1)

## 2023-08-24 LAB — TSH: TSH: 2.213 u[IU]/mL (ref 0.350–4.500)

## 2023-08-24 MED ORDER — SODIUM CHLORIDE 0.9 % IV SOLN: INTRAVENOUS | Status: DC

## 2023-08-24 MED ORDER — HEPARIN SOD (PORK) LOCK FLUSH 100 UNIT/ML IV SOLN
500.0000 [IU] | Freq: Once | INTRAVENOUS | Status: AC | PRN
  Administered 2023-08-24: 500 [IU]

## 2023-08-24 MED ORDER — SODIUM CHLORIDE 0.9% FLUSH
10.0000 mL | INTRAVENOUS | Status: DC | PRN
  Administered 2023-08-24: 10 mL

## 2023-08-24 MED ORDER — SODIUM CHLORIDE 0.9 % IV SOLN
1500.0000 mg | Freq: Once | INTRAVENOUS | Status: AC
  Administered 2023-08-24: 1500 mg via INTRAVENOUS
  Filled 2023-08-24: qty 30

## 2023-08-24 MED ORDER — SODIUM CHLORIDE FLUSH 0.9 % IV SOLN
10.0000 mL | Freq: Once | INTRAVENOUS | Status: AC
  Administered 2023-08-24: 10 mL via INTRAVENOUS
  Filled 2023-08-24: qty 10

## 2023-08-24 NOTE — Progress Notes (Signed)
 Patient Care Team: Dow Longs, PA-C as PCP - General (Family Medicine) Delford Maude BROCKS, MD as PCP - Cardiology (Cardiology) Davonna Siad, MD as Medical Oncologist (Medical Oncology) Celestia Joesph SQUIBB, RN as Oncology Nurse Navigator (Medical Oncology)  Clinic Day:  08/24/2023  Referring physician: Dow Longs, PA-C   CHIEF COMPLAINT:  CC: Stage IVA Small cell lug carcinoma    ASSESSMENT & PLAN:   Assessment & Plan:  Virginia Powell  is a 73 y.o. 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 Treatment response scan showed new lung nodules.  Patient was evaluated for clinical trial at [facility] (Dr.Patel) prior to the biopsy.  Bronchoscopy with biopsy of the new lung lesions showed atypical cells and no evidence of malignancy.  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 Discussed the patient's case at lung tumor board at diagnosis, consensus to treat it like a limited stage small cell lung cancer with chemoRT CT obtained prior to start of durvalumab  showed some new nodules, unknown at this time if it is infection versus carcinoma. Recent CT scan with good response to treatment.  Occasional new nodules have likely inflammatory versus infection.  -Started on maintenance durvalumab .  Patient tolerating treatment well.  Cycle 5-day 1 today.  Reports no rash, diarrhea, shortness of breath.  Labs and physical exam stable today.  Continue with treatment today. - Will repeat CT every 3 months.  Next due in 10/2023 - Continue to follow with ID for Mycobacterium abscessus. - Recent MRI brain with no evidence of disease.  Will obtain MRI brain every 3 months.  Will obtain it with next CT scan.  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  Was seen by ID and currently on observation no active treatment.  Patient reports occasional cough and shortness of breath during cough.  -Continue to follow with ID  Smokes cigarettes Patient is an avid smoker with history of smoking 2 packs/day until recently Patient continues to smoke  -Discussed the risks of smoking including faster progression of cancer, inflammation causing difficulties with chemotherapy and radiation -Offered help to quit smoking, patient is reluctant at this time but stated that she has cut down a lot  Anemia Patient has normocytic anemia, likely secondary to carcinoma and chemotherapy.  Iron labs show very mild iron deficiency. Resolved at this time  -Continue iron pills every other day. -Will continue to monitor  The patient understands the plans discussed today and is in agreement with them.  She knows to contact our office if she develops concerns prior to her next appointment.  I provided 30 minutes of face-to-face time during this encounter and > 50% was spent counseling as documented under my assessment and plan.    Siad Davonna, MD  [facility] 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 Growing solid nodule of the right lower lobe measuring 8 mm and new irregular solid nodule of the left lower lobe measuring 9.3 mm. Lung-RADS 4B, suspicious.  Additional imaging evaluation or consultation with Pulmonology or Thoracic Surgery recommended.   09/09/2022 PET scan   IMPRESSION: 1. Hypermetabolic right hilar adenopathy and bilateral pulmonary nodules, findings indicative of synchronous primary  bronchogenic carcinomas. No evidence of distant metastatic disease. 2. Vague slight thickening of lateral breast soft tissue with metabolism just below blood pool. Consider mammographic correlation, as clinically indicated. 3. Slight marginal irregularity of the liver raises suspicion for cirrhosis. 4. Bilateral adrenal adenomas.   10/02/2022 Imaging   CT chest without contrast: IMPRESSION: 1. No significant change in the appearance of tracer avid nodules within the medial right lower lobe and superior segment of left lower lobe. 2. Stable appearance of tracer avid nodal conglomeration noted within the right hilum. Which is concerning for nodal metastasis. 3. Stable appearance of bilateral adrenal adenomas. No follow-up imaging recommended.   10/04/2022 Pathology Results   A. LUNG, LLL, FINE NEEDLE ASPIRATION:  - No malignant cells identified  - Granulomatous inflammation   B. LUNG, LLL, BRUSHING:  - No malignant cells identified  - Granulomatous inflammation D. LUNG, RLL, FINE NEEDLE ASPIRATION:  - No malignant cells identified   E. 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 Interval enlargement of the superior segment left lower lobe lesion with stable hypermetabolism. 2. Stable 9 mm medial right lower lobe pulmonary nodule with slightly increased SUV max. 3. Persistent hypermetabolic right hilar adenopathy. 4. New 10 mm sub solid lesion in the left lower lobe with SUV max of 2.14. This is likely inflammatory. Attention on future studies is suggested. 5. Stable 14 mm linear density in the right lateral breast with low level FDG uptake. An indolent breast cancer is possible. 6. No findings for abdominal/pelvic metastatic disease or osseous metastatic disease. 7. Stable benign bilateral adrenal gland adenomas.    11/22/2022 Imaging   MRI Brain: No evidence of intracranial metastatic disease.     11/30/2022 - 02/04/2023 Chemotherapy   Patient is on Treatment Plan : LUNG SMALL CELL Cisplatin  (80) D1 + Etoposide  (100) D1-3 q21d      12/13/2022 - 01/07/2023 Radiation Therapy   RT with Dr.Morris at [facility], [place]   02/17/2023 Imaging   CT CAP: IMPRESSION: CHEST:   1. New bilateral pulmonary nodules. 2. Interval decrease in size of RIGHT hilar lymph nodes. 3. Decrease in size of previously described pulmonary nodules.   PELVIS:   1. No evidence of metastatic disease in the abdomen pelvis. 2. Stable benign adrenal adenomas. 3. Sigmoid diverticulosis without diverticulitis.   03/17/2023 Imaging   MRI brain:  No evidence of metastatic disease.   03/29/2023 Procedure   Bronchoscopy with biopsy:  Pathology: Atypical cells AFB culture: Positive for Mycobacterium   04/19/2023 Imaging   CT CAP:  Lungs/pleura: New bilobed nodular lesion in the posterior left lower lobe measures 1.5 x 0.7 cm on image 42, series 2 there are 2 new 3-4 mm left lower lobe pulmonary nodules on image 46, series 2. There is a new 2-3 mm posterior left lower lobe pulmonary nodule on image 47, series 2. Previously seen lingular nodule adjacent to the fissure is nearly resolved. 9 x 6 mm right upper lobe nodule image 15, series 2 is essentially new. Increased size of right upper lobe nodule on image 18, series 2, now measuring 9 x 7 mm (previously this  measured 7 x 5 mm). Increased size of right upper lobe lesion on image 22, series 2 which now measures 2.5 x 1.5 cm (previously this measured 0.9 x 0.7 cm). Cluster of superior right lower lobe nodules on images 22-24, series 2 have decreased in size. 2.2 x 1.5 cm lesion on image 27, series 2 in the right lower lobe previously measured 2.4 x 1.6 cm. There is new pleural-based lesion in the posteromedial right lower lobe measuring 3.6 x 2.1 cm on image 25, series 2. Multiple new right  lower lobe and right middle lobe nodules have a tree-in-bud appearance suggesting an infectious or inflammatory process. No pleural effusion. Patent central airways. Moderate upper lung predominant emphysema.  IMPRESSION: *Mixed response to therapy with some pulmonary nodules increased in size, some decreased in size, and some resolved. *Multiple new right lower lobe and right middle lobe nodules have a tree-in-bud appearance suggesting an infectious or inflammatory process. Consider short-term interval follow-up after completion of therapy to ensure resolution. *No evidence of metastatic disease in the abdomen or pelvis.   04/20/2023 -  Chemotherapy   Patient is on Treatment Plan : LUNG NSCLC Durvalumab  (1500) q28d     06/14/2023 Imaging   MRI brain:  IMPRESSION: 1. No evidence of intracranial metastasis. 2. Unchanged background of mild chronic small vessel disease.   08/10/2023 Imaging   CT chest abdomen pelvis with contrast:  IMPRESSION: 1. Multiple bilateral spiculated pulmonary nodules containing biopsy marking clips are again seen and slightly diminished in size consistent with treatment response. 2. Multiple additional areas of nodularity and consolidation are fluctuant when compared to prior examination, particularly, dense consolidation of the dependent right lower lobe is almost completely resolved, with new small nodules and consolidation of the peripheral left lung base. Findings are consistent with nonspecific superimposed atypical infection or aspiration. 3. Unchanged treated right hilar and subcarinal lymph nodes. 4. No evidence of lymphadenopathy or metastatic disease in the abdomen or pelvis. 5. Unchanged bilateral adrenal adenomata, previously without FDG avidity. 6. Emphysema and diffuse bilateral bronchial wall thickening.       Current Treatment: Completed Chemo RT with Cisplatin  + Etoposide . On maintenance Durvalumab    INTERVAL HISTORY:  Virginia Powell is  here today for follow up.  Patient tolerated previous cycle of durvalumab  well.  Reports no rash, shortness of breath, diarrhea at this time.    Approximately four to five days after her last visit in May 2025, she developed significant shortness of breath with oxygen saturation dropping to 83%. Initially, she was prescribed doxycycline, but later received a steroid and antibiotic injection, which is her usual treatment regimen. She believes the episode was severe, similar to COVID-19, and attributes the infection to her husband, who also had walking pneumonia.  Her appointment with ID is coming up for Mycobacterium infection.  She reported losing appetite and weight during this pneumonia episode but has started to regain both of it.  She recently lost her brother and has been grieving.  She has no other complaints at this time and has been significantly feeling better after resolution of pneumonia.  She has occasional cough and shortness of breath but not limiting her daily activities.  Discussed with patient that next time such episodes happen, to report back to us  as these can be immunotherapy related complications.  We discussed the CT scan findings and that there is treatment response.  Will continue treatment at this time.   I have reviewed the past medical history, past surgical history,  social history and family history with the patient and they are unchanged from previous note.  ALLERGIES:  is allergic to morphine and codeine.  MEDICATIONS:  Current Outpatient Medications  Medication Sig Dispense Refill   albuterol (PROVENTIL) (2.5 MG/3ML) 0.083% nebulizer solution SMARTSIG:1 Vial(s) Via Nebulizer Every 4-6 Hours PRN     albuterol (VENTOLIN HFA) 108 (90 Base) MCG/ACT inhaler SMARTSIG:2 Puff(s) By Mouth Every 4 Hours     atorvastatin (LIPITOR) 40 MG tablet Take 40 mg by mouth daily.     benzonatate (TESSALON) 100 MG capsule Take 100 mg by mouth 3 (three) times daily as needed for cough.      ciprofloxacin  (CIPRO ) 500 MG tablet Take 1 tablet (500 mg total) by mouth 2 (two) times daily. 20 tablet 0   CISPLATIN  IV Inject into the vein every 21 ( twenty-one) days.     clindamycin (CLEOCIN) 300 MG capsule Take 300 mg by mouth 3 (three) times daily.     ETOPOSIDE  IV Inject into the vein every 21 ( twenty-one) days.     ferrous sulfate 324 MG TBEC Take 324 mg by mouth.     fexofenadine (ALLEGRA) 180 MG tablet Take 180 mg by mouth daily as needed for allergies or rhinitis.     fluticasone (FLONASE) 50 MCG/ACT nasal spray Place into both nostrils daily as needed.     hydrochlorothiazide (HYDRODIURIL) 12.5 MG tablet Take 12.5 mg by mouth daily.     hydrOXYzine (ATARAX) 25 MG tablet Take 1 tablet by mouth at bedtime as needed.     lidocaine -prilocaine  (EMLA ) cream Apply a quarter-sized amount to port a cath site and cover with plastic wrap 1 hour prior to infusion appointments 30 g 3   lidocaine -prilocaine  (EMLA ) cream Apply to affected area once 30 g 3   loratadine (CLARITIN) 10 MG tablet Take 10 mg by mouth daily as needed for allergies.     olmesartan  (BENICAR ) 40 MG tablet Take 1 tablet (40 mg total) by mouth daily. 30 tablet 0   ondansetron  (ZOFRAN ) 8 MG tablet Take 1 tablet (8 mg total) by mouth every 8 (eight) hours as needed for nausea or vomiting. 30 tablet 1   prochlorperazine  (COMPAZINE ) 10 MG tablet Take 1 tablet (10 mg total) by mouth every 6 (six) hours as needed for nausea or vomiting. 30 tablet 1   rizatriptan (MAXALT-MLT) 10 MG disintegrating tablet Take 10 mg by mouth as needed for migraine. May repeat in 2 hours if needed     sucralfate  (CARAFATE ) 1 GM/10ML suspension Take 10 mLs (1 g total) by mouth 4 (four) times daily -  with meals and at bedtime. 420 mL 0   TRELEGY ELLIPTA 100-62.5-25 MCG/ACT AEPB Inhale 1 puff into the lungs daily.     No current facility-administered medications for this visit.   Facility-Administered Medications Ordered in Other Visits  Medication  Dose Route Frequency Provider Last Rate Last Admin   0.9 %  sodium chloride  infusion   Intravenous Continuous Cordarrius Coad, MD   Stopped at 08/24/23 1533   sodium chloride  flush (NS) 0.9 % injection 10 mL  10 mL Intracatheter PRN Beena Catano, MD   10 mL at 08/24/23 1533     REVIEW OF SYSTEMS:   Constitutional: Denies fevers, chills or abnormal weight loss Eyes: Denies blurriness of vision Ears, nose, mouth, throat, and face: Denies mucositis or sore throat Respiratory: Denies cough, dyspnea or wheezes Cardiovascular: Denies palpitation, chest discomfort or lower extremity swelling Gastrointestinal:  Denies nausea, heartburn  or change in bowel habits Skin: Denies abnormal skin rashes Lymphatics: Denies new lymphadenopathy or easy bruising Neurological:Denies numbness, tingling or new weaknesses Behavioral/Psych: Mood is stable, no new changes  All other systems were reviewed with the patient and are negative.   VITALS:  Weight 98 lb 8.7 oz (44.7 kg).  Wt Readings from Last 3 Encounters:  08/24/23 98 lb 8.7 oz (44.7 kg)  07/20/23 98 lb 5.2 oz (44.6 kg)  06/22/23 102 lb 15.3 oz (46.7 kg)    Performance status (ECOG): 0 - Asymptomatic  PHYSICAL EXAM:   GENERAL:alert, no distress and comfortable SKIN: skin color, texture, turgor are normal, no rashes or significant lesions LUNGS: clear to auscultation and percussion with normal breathing effort HEART: regular rate & rhythm and no murmurs and no lower extremity edema ABDOMEN:abdomen soft, non-tender and normal bowel sounds Musculoskeletal:no cyanosis of digits and no clubbing  NEURO: alert & oriented x 3 with fluent speech  LABORATORY DATA:  I have reviewed the data as listed    Component Value Date/Time   NA 138 08/24/2023 1211   K 3.7 08/24/2023 1211   CL 105 08/24/2023 1211   CO2 21 (L) 08/24/2023 1211   GLUCOSE 108 (H) 08/24/2023 1211   BUN 24 (H) 08/24/2023 1211   CREATININE 0.88 08/24/2023 1211   CALCIUM  9.2 08/24/2023 1211   PROT 7.3 08/24/2023 1211   ALBUMIN 4.0 08/24/2023 1211   AST 19 08/24/2023 1211   ALT 19 08/24/2023 1211   ALKPHOS 50 08/24/2023 1211   BILITOT 0.6 08/24/2023 1211   GFRNONAA >60 08/24/2023 1211    Lab Results  Component Value Date   WBC 7.0 08/24/2023   NEUTROABS 5.2 08/24/2023   HGB 12.6 08/24/2023   HCT 38.8 08/24/2023   MCV 99.2 08/24/2023   PLT 365 08/24/2023      Chemistry      Component Value Date/Time   NA 138 08/24/2023 1211   K 3.7 08/24/2023 1211   CL 105 08/24/2023 1211   CO2 21 (L) 08/24/2023 1211   BUN 24 (H) 08/24/2023 1211   CREATININE 0.88 08/24/2023 1211      Component Value Date/Time   CALCIUM 9.2 08/24/2023 1211   ALKPHOS 50 08/24/2023 1211   AST 19 08/24/2023 1211   ALT 19 08/24/2023 1211   BILITOT 0.6 08/24/2023 1211      Latest Reference Range & Units 04/13/23 12:35  Iron 28 - 170 ug/dL 56  UIBC ug/dL 730  TIBC 749 - 549 ug/dL 674  Saturation Ratios 10.4 - 31.8 % 17  Ferritin 11 - 307 ng/mL 103   RADIOGRAPHIC STUDIES:  I have personally reviewed the radiological images as listed and agreed with the findings in the report.   CT CHEST ABDOMEN PELVIS W CONTRAST CLINICAL DATA:  Follow-up lung cancer * Tracking Code: BO *  EXAM: CT CHEST, ABDOMEN, AND PELVIS WITH CONTRAST  TECHNIQUE: Multidetector CT imaging of the chest, abdomen and pelvis was performed following the standard protocol during bolus administration of intravenous contrast.  RADIATION DOSE REDUCTION: This exam was performed according to the departmental dose-optimization program which includes automated exposure control, adjustment of the mA and/or kV according to patient size and/or use of iterative reconstruction technique.  CONTRAST:  80mL OMNIPAQUE  IOHEXOL  300 MG/ML SOLN, additional oral enteric contrast  COMPARISON:  CT chest abdomen pelvis, 04/19/2023 PET-CT, 11/18/2022  FINDINGS: CT CHEST FINDINGS  Cardiovascular: Right chest port  catheter. Aortic atherosclerosis normal heart size. No pericardial effusion.  Mediastinum/Nodes:  Unchanged treated right hilar and subcarinal lymph nodes right hilar node measuring up to 1.4 x 1.3 cm (series 2, image 29). Thyroid  gland, trachea, and esophagus demonstrate no significant findings.  Lungs/Pleura: Severe emphysema. Diffuse bilateral bronchial wall thickening. Multiple bilateral spiculated pulmonary nodules containing biopsy marking clips are again seen and slightly diminished in size. Nodule of the posterior right upper lobe is diminished in size, measuring 1.7 x 1.0 cm, previously 2.4 x 1.6 cm (series 3, image 58). Nodule in the infrahilar left lower lobe slightly diminished in size, measuring 2.4 x 1.7 cm, previously 3.1 x 1.8 cm when measured similarly (series 3, image 66). Multiple additional areas of nodularity and consolidation are fluctuant when compared to prior examination, particularly, dense consolidation of the dependent right lower lobe is almost completely resolved, with new small nodules and consolidation of the peripheral left lung base (series 3, image 111). No pleural effusion or pneumothorax.  Musculoskeletal: No chest wall abnormality. No acute osseous findings.  CT ABDOMEN PELVIS FINDINGS  Hepatobiliary: Multiple small low-attenuation lesions scattered throughout the liver unchanged, previously without FDG avidity and consistent with benign cysts or hemangiomata. No gallstones, gallbladder wall thickening, or biliary dilatation.  Pancreas: Unremarkable. No pancreatic ductal dilatation or surrounding inflammatory changes.  Spleen: Normal in size without significant abnormality.  Adrenals/Urinary Tract: Unchanged bilateral adrenal adenomata, previously without FDG avidity (series 2, image 52). Kidneys are normal, without renal calculi, solid lesion, or hydronephrosis. Bladder is unremarkable.  Stomach/Bowel: Stomach is within normal limits.  Appendix appears normal. No evidence of bowel wall thickening, distention, or inflammatory changes. Sigmoid diverticulosis.  Vascular/Lymphatic: Aortic atherosclerosis. No enlarged abdominal or pelvic lymph nodes.  Reproductive: Status post hysterectomy.  Other: No abdominal wall hernia or abnormality. No ascites.  Musculoskeletal: No acute osseous findings.  IMPRESSION: 1. Multiple bilateral spiculated pulmonary nodules containing biopsy marking clips are again seen and slightly diminished in size consistent with treatment response. 2. Multiple additional areas of nodularity and consolidation are fluctuant when compared to prior examination, particularly, dense consolidation of the dependent right lower lobe is almost completely resolved, with new small nodules and consolidation of the peripheral left lung base. Findings are consistent with nonspecific superimposed atypical infection or aspiration. 3. Unchanged treated right hilar and subcarinal lymph nodes. 4. No evidence of lymphadenopathy or metastatic disease in the abdomen or pelvis. 5. Unchanged bilateral adrenal adenomata, previously without FDG avidity. 6. Emphysema and diffuse bilateral bronchial wall thickening.  Aortic Atherosclerosis (ICD10-I70.0) and Emphysema (ICD10-J43.9).  Electronically Signed   By: Marolyn JONETTA Jaksch M.D.   On: 08/10/2023 16:58

## 2023-08-24 NOTE — Assessment & Plan Note (Signed)
 Patient has Mycobacterium abscessus infection postchemotherapy.  Was seen by ID and currently on observation no active treatment.  Patient reports occasional cough and shortness of breath during cough. -Continue to follow with ID

## 2023-08-24 NOTE — Progress Notes (Signed)
 Patient presents today for Imfinzi  infusion. Patient is in satisfactory condition with no new complaints voiced.  Vital signs are stable.  Labs reviewed by Dr. Davonna during the office visit and all labs are within treatment parameters. Patient's HR noted to be 117 re-checked with a HR of 108. Dr.Kandala made aware. We will proceed with treatment per MD orders.   Treatment given today per MD orders. Tolerated infusion without adverse affects. Vital signs stable. No complaints at this time. Discharged from clinic ambulatory in stable condition. Alert and oriented x 3. F/U with [facility] as scheduled.

## 2023-08-24 NOTE — Assessment & Plan Note (Signed)
 Patient has normocytic anemia, likely secondary to carcinoma and chemotherapy.  Iron labs show very mild iron deficiency. Resolved at this time  -Continue iron pills every other day. -Will continue to monitor

## 2023-08-24 NOTE — Assessment & Plan Note (Signed)
 Oncology history below. 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 Treatment response scan showed new lung nodules.  Patient was evaluated for clinical trial at [facility] (Dr.Patel) prior to the biopsy.  Bronchoscopy with biopsy of the new lung lesions showed atypical cells and no evidence of malignancy.  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 Discussed the patient's case at lung tumor board at diagnosis, consensus to treat it like a limited stage small cell lung cancer with chemoRT CT obtained prior to start of durvalumab  showed some new nodules, unknown at this time if it is infection versus carcinoma. Recent CT scan with good response to treatment.  Occasional new nodules have likely inflammatory versus infection.  -Started on maintenance durvalumab .  Patient tolerating treatment well.  Cycle 5-day 1 today.  Reports no rash, diarrhea, shortness of breath.  Labs and physical exam stable today.  Continue with treatment today. - Will repeat CT every 3 months.  Next due in 10/2023 - Continue to follow with ID for Mycobacterium abscessus. - Recent MRI brain with no evidence of disease.  Will obtain MRI brain every 3 months.  Will obtain it with next CT scan.  Return to clinic in 8 weeks with labs prior to next cycle of durvalumab 

## 2023-08-24 NOTE — Assessment & Plan Note (Signed)
-  Patient is an avid smoker with history of smoking 2 packs/day until recently -Patient continues to smoke -Discussed the risks of smoking including faster progression of cancer, inflammation causing difficulties with chemotherapy and radiation -Offered help to quit smoking, patient is reluctant at this time but stated that she has cut down a lot

## 2023-08-26 ENCOUNTER — Other Ambulatory Visit: Payer: Self-pay

## 2023-08-30 NOTE — Progress Notes (Unsigned)
 Subjective:  Chief Complaint: follow-up for M abscessus   Patient ID: Virginia Powell, female    DOB: [birth year], 73 y.o.   MRN: [id number]  HPI  Discussed the use of AI scribe software for clinical note transcription with the patient, who gave verbal consent to proceed.  History of Present Illness   Allicia Munachimso Powell is a 73 year old female with COPD and lung cancer whom I have been seeing for a positive culture for M abscessus.   She brought notes from an acute respiratory illness in May of 2025.  She developed respiratory symptoms after her husband was diagnosed with walking pneumonia. Her symptoms persisted despite treatment with, doxycycline, and a ceftriaxone  shot. Her oxygen levels dropped to 84%. She manages occasional coughing with Mucinex and pearl drops at night.  She completed chemotherapy with cisplatin  and etoposide  in December, followed by the discovery of new lung nodules. She started on durvalumab , which reduced the nodules. She has a history of mycobacterium abscessus,   . She experiences morning congestion with mustard-colored sputum and shortness of breath with exertion. She experienced weight loss during her illness, dropping to 93 pounds, but has regained weight to 98 pounds with Ensure nutritional drinks.       Past Medical History:  Diagnosis Date   Complication of anesthesia    COPD (chronic obstructive pulmonary disease) (HCC)    Dyspnea    Essential hypertension    Headache    Hyperlipidemia    Hypertension    Migraines    Pneumonia 04/2018   PONV (postoperative nausea and vomiting)    Psoriasis    Seasonal allergies    Smoker     Past Surgical History:  Procedure Laterality Date   ABDOMINAL HYSTERECTOMY     BRONCHIAL BIOPSY  10/04/2022   Procedure: BRONCHIAL BIOPSIES;  Surgeon: Shelah Lamar RAMAN, MD;  Location: [facility] ENDOSCOPY;  Service: Pulmonary;;   BRONCHIAL BIOPSY  03/29/2023   Procedure: BRONCHIAL BIOPSIES;  Surgeon: Shelah Lamar RAMAN,  MD;  Location: [facility] ENDOSCOPY;  Service: Pulmonary;;   BRONCHIAL BRUSHINGS  10/04/2022   Procedure: BRONCHIAL BRUSHINGS;  Surgeon: Shelah Lamar RAMAN, MD;  Location: [facility] ENDOSCOPY;  Service: Pulmonary;;   BRONCHIAL BRUSHINGS  03/29/2023   Procedure: BRONCHIAL BRUSHINGS;  Surgeon: Shelah Lamar RAMAN, MD;  Location: [facility] ENDOSCOPY;  Service: Pulmonary;;   BRONCHIAL NEEDLE ASPIRATION BIOPSY  10/04/2022   Procedure: BRONCHIAL NEEDLE ASPIRATION BIOPSIES;  Surgeon: Shelah Lamar RAMAN, MD;  Location: [facility] ENDOSCOPY;  Service: Pulmonary;;   BRONCHIAL NEEDLE ASPIRATION BIOPSY  03/29/2023   Procedure: BRONCHIAL NEEDLE ASPIRATION BIOPSIES;  Surgeon: Shelah Lamar RAMAN, MD;  Location: [facility] ENDOSCOPY;  Service: Pulmonary;;   BRONCHIAL WASHINGS  10/04/2022   Procedure: BRONCHIAL WASHINGS;  Surgeon: Shelah Lamar RAMAN, MD;  Location: [facility] ENDOSCOPY;  Service: Pulmonary;;   BRONCHIAL WASHINGS  03/29/2023   Procedure: BRONCHIAL WASHINGS;  Surgeon: Shelah Lamar RAMAN, MD;  Location: [facility] ENDOSCOPY;  Service: Pulmonary;;   CATARACT EXTRACTION Bilateral    FIDUCIAL MARKER PLACEMENT  10/04/2022   Procedure: FIDUCIAL MARKER PLACEMENT;  Surgeon: Shelah Lamar RAMAN, MD;  Location: [facility] ENDOSCOPY;  Service: Pulmonary;;   FIDUCIAL MARKER PLACEMENT  03/29/2023   Procedure: FIDUCIAL MARKER PLACEMENT;  Surgeon: Shelah Lamar RAMAN, MD;  Location: [facility] ENDOSCOPY;  Service: Pulmonary;;   IR IMAGING GUIDED PORT INSERTION  11/17/2022   PARTIAL HYSTERECTOMY     TUBAL LIGATION     VIDEO BRONCHOSCOPY WITH ENDOBRONCHIAL ULTRASOUND N/A 10/04/2022   Procedure: VIDEO BRONCHOSCOPY WITH ENDOBRONCHIAL ULTRASOUND;  Surgeon: Shelah Lamar RAMAN, MD;  Location: [facility] ENDOSCOPY;  Service: Pulmonary;  Laterality: N/A;    Family History  Problem Relation Age of Onset   Heart attack Brother    Hyperlipidemia Brother    Hyperlipidemia Mother       Social History   Socioeconomic History   Marital status: Married    Spouse name: Not on file   Number of children: Not on file   Years of  education: Not on file   Highest education level: Not on file  Occupational History   Not on file  Tobacco Use   Smoking status: Some Days    Current packs/day: 1.62    Average packs/day: 1.6 packs/day for 55.0 years (89.1 ttl pk-yrs)    Types: Cigarettes   Smokeless tobacco: Never   Tobacco comments:    Smoking 1-2 puffs.  Went 8 days without smoking.  Has not bought anymore.  11/09/2022 hfb  Vaping Use   Vaping status: Never Used  Substance and Sexual Activity   Alcohol  use: Not Currently    Comment: social   Drug use: Never   Sexual activity: Not on file  Other Topics Concern   Not on file  Social History Narrative   ** Merged History Encounter **       Social Drivers of Corporate investment banker Strain: Not on file  Food Insecurity: Not on file  Transportation Needs: Not on file  Physical Activity: Not on file  Stress: Not on file  Social Connections: Not on file    Allergies  Allergen Reactions   Morphine And Codeine Nausea And Vomiting     Current Outpatient Medications:    albuterol (PROVENTIL) (2.5 MG/3ML) 0.083% nebulizer solution, SMARTSIG:1 Vial(s) Via Nebulizer Every 4-6 Hours PRN, Disp: , Rfl:    albuterol (VENTOLIN HFA) 108 (90 Base) MCG/ACT inhaler, SMARTSIG:2 Puff(s) By Mouth Every 4 Hours, Disp: , Rfl:    atorvastatin (LIPITOR) 40 MG tablet, Take 40 mg by mouth daily., Disp: , Rfl:    benzonatate (TESSALON) 100 MG capsule, Take 100 mg by mouth 3 (three) times daily as needed for cough., Disp: , Rfl:    ciprofloxacin  (CIPRO ) 500 MG tablet, Take 1 tablet (500 mg total) by mouth 2 (two) times daily., Disp: 20 tablet, Rfl: 0   CISPLATIN  IV, Inject into the vein every 21 ( twenty-one) days., Disp: , Rfl:    clindamycin (CLEOCIN) 300 MG capsule, Take 300 mg by mouth 3 (three) times daily., Disp: , Rfl:    ETOPOSIDE  IV, Inject into the vein every 21 ( twenty-one) days., Disp: , Rfl:    ferrous sulfate 324 MG TBEC, Take 324 mg by mouth., Disp: , Rfl:     fexofenadine (ALLEGRA) 180 MG tablet, Take 180 mg by mouth daily as needed for allergies or rhinitis., Disp: , Rfl:    fluticasone (FLONASE) 50 MCG/ACT nasal spray, Place into both nostrils daily as needed., Disp: , Rfl:    hydrochlorothiazide (HYDRODIURIL) 12.5 MG tablet, Take 12.5 mg by mouth daily., Disp: , Rfl:    hydrOXYzine (ATARAX) 25 MG tablet, Take 1 tablet by mouth at bedtime as needed., Disp: , Rfl:    lidocaine -prilocaine  (EMLA ) cream, Apply a quarter-sized amount to port a cath site and cover with plastic wrap 1 hour prior to infusion appointments, Disp: 30 g, Rfl: 3   lidocaine -prilocaine  (EMLA ) cream, Apply to affected area once, Disp: 30 g, Rfl: 3   loratadine (CLARITIN) 10 MG tablet, Take 10 mg  by mouth daily as needed for allergies., Disp: , Rfl:    olmesartan  (BENICAR ) 40 MG tablet, Take 1 tablet (40 mg total) by mouth daily., Disp: 30 tablet, Rfl: 0   ondansetron  (ZOFRAN ) 8 MG tablet, Take 1 tablet (8 mg total) by mouth every 8 (eight) hours as needed for nausea or vomiting., Disp: 30 tablet, Rfl: 1   prochlorperazine  (COMPAZINE ) 10 MG tablet, Take 1 tablet (10 mg total) by mouth every 6 (six) hours as needed for nausea or vomiting., Disp: 30 tablet, Rfl: 1   rizatriptan (MAXALT-MLT) 10 MG disintegrating tablet, Take 10 mg by mouth as needed for migraine. May repeat in 2 hours if needed, Disp: , Rfl:    sucralfate  (CARAFATE ) 1 GM/10ML suspension, Take 10 mLs (1 g total) by mouth 4 (four) times daily -  with meals and at bedtime., Disp: 420 mL, Rfl: 0   TRELEGY ELLIPTA 100-62.5-25 MCG/ACT AEPB, Inhale 1 puff into the lungs daily., Disp: , Rfl:     Review of Systems  Constitutional:  Negative for activity change, appetite change, chills, diaphoresis, fatigue, fever and unexpected weight change.  HENT:  Negative for congestion, rhinorrhea, sinus pressure, sneezing, sore throat and trouble swallowing.   Eyes:  Negative for photophobia and visual disturbance.  Respiratory:   Negative for cough, chest tightness, shortness of breath, wheezing and stridor.   Cardiovascular:  Negative for chest pain, palpitations and leg swelling.  Gastrointestinal:  Negative for abdominal distention, abdominal pain, anal bleeding, blood in stool, constipation, diarrhea, nausea and vomiting.  Genitourinary:  Negative for difficulty urinating, dysuria, flank pain and hematuria.  Musculoskeletal:  Negative for arthralgias, back pain, gait problem, joint swelling and myalgias.  Skin:  Negative for color change, pallor, rash and wound.  Neurological:  Negative for dizziness, tremors, weakness and light-headedness.  Hematological:  Negative for adenopathy. Does not bruise/bleed easily.  Psychiatric/Behavioral:  Negative for agitation, behavioral problems, confusion, decreased concentration, dysphoric mood and sleep disturbance.        Objective:   Physical Exam Constitutional:      General: She is not in acute distress.    Appearance: Normal appearance. She is well-developed. She is not ill-appearing or diaphoretic.  HENT:     Head: Normocephalic and atraumatic.     Right Ear: Hearing and external ear normal.     Left Ear: Hearing and external ear normal.     Nose: No nasal deformity or rhinorrhea.  Eyes:     General: No scleral icterus.    Conjunctiva/sclera: Conjunctivae normal.     Right eye: Right conjunctiva is not injected.     Left eye: Left conjunctiva is not injected.     Pupils: Pupils are equal, round, and reactive to light.  Neck:     Vascular: No JVD.  Cardiovascular:     Rate and Rhythm: Normal rate and regular rhythm.     Heart sounds: S1 normal and S2 normal.  Pulmonary:     Effort: Pulmonary effort is normal. No respiratory distress.     Breath sounds: No wheezing.  Abdominal:     General: Bowel sounds are normal. There is no distension.     Palpations: Abdomen is soft.     Tenderness: There is no abdominal tenderness.  Musculoskeletal:        General:  Normal range of motion.     Right shoulder: Normal.     Left shoulder: Normal.     Cervical back: Normal range of motion and neck supple.  Right hip: Normal.     Left hip: Normal.     Right knee: Normal.     Left knee: Normal.  Lymphadenopathy:     Head:     Right side of head: No submandibular, preauricular or posterior auricular adenopathy.     Left side of head: No submandibular, preauricular or posterior auricular adenopathy.     Cervical: No cervical adenopathy.     Right cervical: No superficial or deep cervical adenopathy.    Left cervical: No superficial or deep cervical adenopathy.  Skin:    General: Skin is warm and dry.     Coloration: Skin is not pale.     Findings: No abrasion, bruising, ecchymosis, erythema, lesion or rash.     Nails: There is no clubbing.  Neurological:     General: No focal deficit present.     Mental Status: She is alert and oriented to person, place, and time.     Sensory: No sensory deficit.     Coordination: Coordination normal.     Gait: Gait normal.  Psychiatric:        Attention and Perception: She is attentive.        Mood and Affect: Mood normal.        Speech: Speech normal.        Behavior: Behavior normal. Behavior is cooperative.        Thought Content: Thought content normal.        Judgment: Judgment normal.           Assessment & Plan:   Assessment and Plan    Mycobacterium abscessus infection does NOT meet criteria for treatment at this time which is reassuring. I had some concerns that on chemo this might become a problem but so far it has not - Monitor without treatment.  Lung nodules Decreased in size post-durvalumab . No significant progression. - Follow-up with oncology as scheduled.  Lung Cancer Positive response to durvalumab  with lung nodule reduction. Fatigue and weakness likely from chemotherapy. - Continue oncology follow-up as scheduled.  Chronic Obstructive Pulmonary Disease (COPD) Increased risk  for respiratory infections. Occasional exertional dyspnea, possibly exacerbated by chemotherapy. - Continue Mucinex and pearl drops for symptomatic relief.  Weight loss Recent weight loss to 93 pounds, regained to 98 pounds. Supplementing diet with Ensure. - Continue Ensure supplementation to maintain weight.   Smoking: she has cut down from prior 2 packs per day

## 2023-08-31 ENCOUNTER — Other Ambulatory Visit: Payer: Self-pay

## 2023-08-31 ENCOUNTER — Encounter: Payer: Self-pay | Admitting: Infectious Disease

## 2023-08-31 ENCOUNTER — Ambulatory Visit: Payer: Self-pay | Admitting: Infectious Disease

## 2023-08-31 VITALS — BP 111/75 | HR 101 | Temp 97.8°F | Ht 64.0 in | Wt 98.0 lb

## 2023-08-31 DIAGNOSIS — A319 Mycobacterial infection, unspecified: Secondary | ICD-10-CM

## 2023-08-31 DIAGNOSIS — C7802 Secondary malignant neoplasm of left lung: Secondary | ICD-10-CM | POA: Diagnosis not present

## 2023-08-31 DIAGNOSIS — J449 Chronic obstructive pulmonary disease, unspecified: Secondary | ICD-10-CM | POA: Diagnosis not present

## 2023-08-31 DIAGNOSIS — C7801 Secondary malignant neoplasm of right lung: Secondary | ICD-10-CM | POA: Diagnosis not present

## 2023-08-31 DIAGNOSIS — F17210 Nicotine dependence, cigarettes, uncomplicated: Secondary | ICD-10-CM

## 2023-09-13 NOTE — Telephone Encounter (Signed)
 SABRA

## 2023-09-21 ENCOUNTER — Inpatient Hospital Stay: Attending: Oncology

## 2023-09-21 ENCOUNTER — Ambulatory Visit: Admitting: Oncology

## 2023-09-21 ENCOUNTER — Ambulatory Visit

## 2023-09-21 ENCOUNTER — Other Ambulatory Visit

## 2023-09-21 ENCOUNTER — Inpatient Hospital Stay

## 2023-09-21 VITALS — BP 101/65 | HR 80 | Temp 96.8°F | Resp 18

## 2023-09-21 DIAGNOSIS — C7802 Secondary malignant neoplasm of left lung: Secondary | ICD-10-CM | POA: Insufficient documentation

## 2023-09-21 DIAGNOSIS — C3490 Malignant neoplasm of unspecified part of unspecified bronchus or lung: Secondary | ICD-10-CM | POA: Diagnosis not present

## 2023-09-21 DIAGNOSIS — C7801 Secondary malignant neoplasm of right lung: Secondary | ICD-10-CM | POA: Insufficient documentation

## 2023-09-21 DIAGNOSIS — Z79620 Long term (current) use of immunosuppressive biologic: Secondary | ICD-10-CM | POA: Diagnosis not present

## 2023-09-21 DIAGNOSIS — Z5112 Encounter for antineoplastic immunotherapy: Secondary | ICD-10-CM | POA: Insufficient documentation

## 2023-09-21 DIAGNOSIS — F17210 Nicotine dependence, cigarettes, uncomplicated: Secondary | ICD-10-CM | POA: Diagnosis not present

## 2023-09-21 LAB — CBC WITH DIFFERENTIAL/PLATELET
Abs Immature Granulocytes: 0.03 K/uL (ref 0.00–0.07)
Basophils Absolute: 0 K/uL (ref 0.0–0.1)
Basophils Relative: 0 %
Eosinophils Absolute: 0.1 K/uL (ref 0.0–0.5)
Eosinophils Relative: 1 %
HCT: 40.7 % (ref 36.0–46.0)
Hemoglobin: 13.3 g/dL (ref 12.0–15.0)
Immature Granulocytes: 1 %
Lymphocytes Relative: 15 %
Lymphs Abs: 0.9 K/uL (ref 0.7–4.0)
MCH: 32.5 pg (ref 26.0–34.0)
MCHC: 32.7 g/dL (ref 30.0–36.0)
MCV: 99.5 fL (ref 80.0–100.0)
Monocytes Absolute: 0.4 K/uL (ref 0.1–1.0)
Monocytes Relative: 6 %
Neutro Abs: 4.3 K/uL (ref 1.7–7.7)
Neutrophils Relative %: 77 %
Platelets: 334 K/uL (ref 150–400)
RBC: 4.09 MIL/uL (ref 3.87–5.11)
RDW: 14.9 % (ref 11.5–15.5)
WBC: 5.7 K/uL (ref 4.0–10.5)
nRBC: 0 % (ref 0.0–0.2)

## 2023-09-21 LAB — COMPREHENSIVE METABOLIC PANEL WITH GFR
ALT: 19 U/L (ref 0–44)
AST: 21 U/L (ref 15–41)
Albumin: 3.8 g/dL (ref 3.5–5.0)
Alkaline Phosphatase: 50 U/L (ref 38–126)
Anion gap: 12 (ref 5–15)
BUN: 34 mg/dL — ABNORMAL HIGH (ref 8–23)
CO2: 19 mmol/L — ABNORMAL LOW (ref 22–32)
Calcium: 9.1 mg/dL (ref 8.9–10.3)
Chloride: 107 mmol/L (ref 98–111)
Creatinine, Ser: 0.93 mg/dL (ref 0.44–1.00)
GFR, Estimated: >60 mL/min (ref >60)
Glucose, Bld: 131 mg/dL — ABNORMAL HIGH (ref 70–99)
Potassium: 3.3 mmol/L — ABNORMAL LOW (ref 3.5–5.1)
Sodium: 138 mmol/L (ref 135–145)
Total Bilirubin: 0.9 mg/dL (ref 0.0–1.2)
Total Protein: 6.9 g/dL (ref 6.5–8.1)

## 2023-09-21 LAB — MAGNESIUM: Magnesium: 2.1 mg/dL (ref 1.7–2.4)

## 2023-09-21 LAB — TSH: TSH: 1.446 u[IU]/mL (ref 0.350–4.500)

## 2023-09-21 MED ORDER — POTASSIUM CHLORIDE CRYS ER 20 MEQ PO TBCR
40.0000 meq | EXTENDED_RELEASE_TABLET | Freq: Once | ORAL | Status: AC
  Administered 2023-09-21: 40 meq via ORAL
  Filled 2023-09-21: qty 2

## 2023-09-21 MED ORDER — SODIUM CHLORIDE 0.9 % IV SOLN
1500.0000 mg | Freq: Once | INTRAVENOUS | Status: AC
  Administered 2023-09-21: 1500 mg via INTRAVENOUS
  Filled 2023-09-21: qty 30

## 2023-09-21 MED ORDER — SODIUM CHLORIDE 0.9 % IV SOLN: INTRAVENOUS | Status: DC

## 2023-09-21 MED ORDER — SODIUM CHLORIDE 0.9% FLUSH: 10.0000 mL | Freq: Once | INTRAVENOUS | Status: DC

## 2023-09-21 NOTE — Progress Notes (Signed)
 OK to proceed with HR recheck of 104  Dr Ivery Molt, PharmD

## 2023-09-21 NOTE — Progress Notes (Signed)
 Patient presents today for chemotherapy infusion.  Patient is in satisfactory condition with no new complaints voiced.  All other vital signs are stable. HR noted to be 104 today. Dr.Kandala made aware.  Labs reviewed and all labs are within treatment parameters.  We will proceed with treatment per MD orders.    Treatment given today per MD orders. Tolerated infusion without adverse affects. Vital signs stable. No complaints at this time. Discharged from clinic ambulatory in stable condition. Alert and oriented x 3. F/U with [facility] as scheduled.

## 2023-09-21 NOTE — Patient Instructions (Signed)
 [facility] [place] - A DEPT OF [facility]  Discharge Instructions: Thank you for choosing [facility] to provide your oncology and hematology care.  If you have a lab appointment with the Cancer Center - please note that after April 8th, 2024, all labs will be drawn in the cancer center.  You do not have to check in or register with the main entrance as you have in the past but will complete your check-in in the cancer center.  Wear comfortable clothing and clothing appropriate for easy access to any Portacath or PICC line.   We strive to give you quality time with your provider. You may need to reschedule your appointment if you arrive late (15 or more minutes).  Arriving late affects you and other patients whose appointments are after yours.  Also, if you miss three or more appointments without notifying the office, you may be dismissed from the clinic at the provider's discretion.      For prescription refill requests, have your pharmacy contact our office and allow 72 hours for refills to be completed.    Today you received the following chemotherapy and/or immunotherapy agents Imfinzi      To help prevent nausea and vomiting after your treatment, we encourage you to take your nausea medication as directed.  BELOW ARE SYMPTOMS THAT SHOULD BE REPORTED IMMEDIATELY: *FEVER GREATER THAN 100.4 F (38 C) OR HIGHER *CHILLS OR SWEATING *NAUSEA AND VOMITING THAT IS NOT CONTROLLED WITH YOUR NAUSEA MEDICATION *UNUSUAL SHORTNESS OF BREATH *UNUSUAL BRUISING OR BLEEDING *URINARY PROBLEMS (pain or burning when urinating, or frequent urination) *BOWEL PROBLEMS (unusual diarrhea, constipation, pain near the anus) TENDERNESS IN MOUTH AND THROAT WITH OR WITHOUT PRESENCE OF ULCERS (sore throat, sores in mouth, or a toothache) UNUSUAL RASH, SWELLING OR PAIN  UNUSUAL VAGINAL DISCHARGE OR ITCHING   Items with * indicate a potential emergency and should be followed up  as soon as possible or go to the Emergency Department if any problems should occur.  Please show the CHEMOTHERAPY ALERT CARD or IMMUNOTHERAPY ALERT CARD at check-in to the Emergency Department and triage nurse.  Should you have questions after your visit or need to cancel or reschedule your appointment, please contact [facility] [place] - A DEPT OF [facility] [phone]  and follow the prompts.  Office hours are 8:00 a.m. to 4:30 p.m. Monday - Friday. Please note that voicemails left after 4:00 p.m. may not be returned until the following business day.  We are closed weekends and major holidays. You have access to a nurse at all times for urgent questions. Please call the main number to the clinic [phone] and follow the prompts.  For any non-urgent questions, you may also contact your provider using MyChart. We now offer e-Visits for anyone 42 and older to request care online for non-urgent symptoms. For details visit [email or web address].   Also download the MyChart app! Go to the app store, search "MyChart", open the app, select [place], and log in with your MyChart username and password.

## 2023-09-22 LAB — T4: T4, Total: 7.5 ug/dL (ref 4.5–12.0)

## 2023-09-23 ENCOUNTER — Other Ambulatory Visit: Payer: Self-pay

## 2023-10-12 ENCOUNTER — Other Ambulatory Visit: Payer: Self-pay | Admitting: Oncology

## 2023-10-12 DIAGNOSIS — C7801 Secondary malignant neoplasm of right lung: Secondary | ICD-10-CM

## 2023-10-13 ENCOUNTER — Other Ambulatory Visit: Payer: Self-pay

## 2023-10-18 NOTE — Assessment & Plan Note (Signed)
 Likely secondary to radiation -Will send prescription for Carafate  as per nutrition

## 2023-10-18 NOTE — Assessment & Plan Note (Signed)
 Patient has normocytic anemia, likely secondary to carcinoma and chemotherapy.  Iron labs show very mild iron deficiency. Resolved at this time  -Continue iron pills every other day. -Will continue to monitor

## 2023-10-18 NOTE — Assessment & Plan Note (Signed)
 Oncology history below. 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  Treatment response scan showed new lung nodules.   Patient was evaluated for [facility] trial [facility] (Dr.Patel) prior to the biopsy.  Bronchoscopy with biopsy of the new lung lesions showed atypical cells and no evidence of malignancy.  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 Discussed the patient's case at lung tumor board at diagnosis, consensus to treat it like a limited stage small cell lung cancer with chemoRT CT obtained prior to start of durvalumab  showed some new nodules, unknown at this time if it is infection versus carcinoma. 04/20/2023: Started on maintenance durvalumab  Recent CT scan with good response to treatment.  Occasional new nodules have likely inflammatory versus infection.  -Continue maintenance durvalumab .  Patient tolerating treatment well.  Cycle 7-day 1 today.  Reports no rash, diarrhea, shortness of breath.  Labs and physical exam stable today.  Continue with treatment today. - Will repeat CT every 3 months.  Next due in 10/2023 - Continue to follow with ID for Mycobacterium abscessus. - Recent MRI brain with no evidence of disease.  Will obtain MRI brain every 3 months.  Will obtain it with next CT scan.  Return to clinic in 4 weeks with labs, CT scan and MRI brain prior to next cycle of durvalumab 

## 2023-10-18 NOTE — Assessment & Plan Note (Signed)
 Patient has Mycobacterium abscessus infection postchemotherapy.  Was seen by ID and currently on observation no active treatment.  Patient reports occasional cough and shortness of breath during cough. -Continue to follow with ID

## 2023-10-18 NOTE — Progress Notes (Unsigned)
 Patient Care Team: Dow Longs, PA-C as PCP - General (Family Medicine) Delford Maude BROCKS, MD as PCP - Cardiology (Cardiology) Davonna Siad, MD as Medical Oncologist (Medical Oncology) Celestia Joesph SQUIBB, RN as Oncology Nurse Navigator (Medical Oncology)  Clinic Day:  10/19/2023  Referring physician: Dow Longs, PA-C   CHIEF COMPLAINT:  CC: Stage IVA Small cell lug carcinoma    ASSESSMENT & PLAN:   Assessment & Plan:  Virginia Powell  is a 73 y.o. female with Stage IVA Small cell lung carcinoma   Assessment & Plan Small cell carcinoma metastatic to both lungs [facility]) Oncology history below. 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  Treatment response scan showed new lung nodules.   Patient was evaluated for clinical trial at [facility] (Dr.Patel) prior to the biopsy.  Bronchoscopy with biopsy of the new lung lesions showed atypical cells and no evidence of malignancy.  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 Discussed the patient's case at lung tumor board at diagnosis, consensus to treat it like a limited stage small cell lung cancer with chemoRT CT obtained prior to start of durvalumab  showed some new nodules, unknown at this time if it is infection versus carcinoma. 04/20/2023: Started on maintenance durvalumab  Recent CT scan with good response to treatment.  Occasional new nodules have likely inflammatory versus infection.  -Continue maintenance durvalumab .  Patient tolerating treatment well.  Cycle 7-day 1 today.  Reports no rash, diarrhea, shortness of breath.  Labs and physical exam stable today.  Continue with treatment today. - Will repeat CT every 3 months.  Next due in 10/2023 - Continue to follow with ID for Mycobacterium abscessus. - Recent MRI brain with no evidence of disease.  Will obtain MRI brain every 3 months.  Will obtain it with next CT scan.  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  Was seen by ID and currently on observation no active treatment.  Patient reports occasional cough and shortness of breath during cough.  -Continue to follow with ID Smokes cigarettes Patient is an avid smoker with history of smoking 2 packs/day until recently Patient continues to smoke  -Discussed the risks of smoking including faster progression of cancer, inflammation causing difficulties with chemotherapy and radiation -Offered help to quit smoking, patient is reluctant at this time but stated that she has cut down a lot - Patient reports a new strategy where she reduce her smoking by taking a few puffs and then saving the cigarette for later Dysphagia, unspecified type Likely secondary to radiation -Continue Carafate  as per nutrition Anemia due to other cause, not classified Patient has normocytic anemia, likely secondary to carcinoma and chemotherapy.  Iron labs show very mild iron deficiency. Resolved at this time  -Continue iron pills every other day. -Will continue to monitor   The patient understands the plans discussed today and is in agreement with them.  She knows to contact our office if she develops concerns prior to her next appointment.  I provided 30 minutes of face-to-face time during this encounter and > 50% was spent counseling as documented under my assessment and plan.    Siad Davonna, MD  [facility] 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 Growing solid nodule of the right lower lobe measuring 8 mm and new irregular solid nodule of the left lower lobe  measuring 9.3 mm. Lung-RADS 4B, suspicious. Additional imaging evaluation or consultation with Pulmonology or Thoracic Surgery recommended.   09/09/2022 PET scan   IMPRESSION: 1. Hypermetabolic right hilar adenopathy and bilateral pulmonary nodules, findings indicative of synchronous primary bronchogenic carcinomas. No evidence of distant metastatic disease. 2. Vague slight thickening of lateral breast soft tissue with metabolism just below blood pool. Consider mammographic correlation, as clinically indicated. 3. Slight marginal irregularity of the liver raises suspicion for cirrhosis. 4. Bilateral adrenal adenomas.   10/02/2022 Imaging   CT chest without contrast: IMPRESSION: 1. No significant change in the appearance of tracer avid nodules within the medial right lower lobe and superior segment of left lower lobe. 2. Stable appearance of tracer avid nodal conglomeration noted within the right hilum. Which is concerning for nodal metastasis. 3. Stable appearance of bilateral adrenal adenomas. No follow-up imaging recommended.   10/04/2022 Pathology Results   A. LUNG, LLL, FINE NEEDLE ASPIRATION:  - No malignant cells identified  - Granulomatous inflammation   B. LUNG, LLL, BRUSHING:  - No malignant cells identified  - Granulomatous inflammation D. LUNG, RLL, FINE NEEDLE ASPIRATION:  - No malignant cells identified   E. 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 Interval enlargement of the superior segment left lower lobe lesion with stable hypermetabolism. 2. Stable 9 mm medial right lower lobe pulmonary nodule with slightly increased SUV max. 3. Persistent hypermetabolic right hilar adenopathy. 4. New 10 mm sub solid lesion in the left lower lobe with SUV max of 2.14. This is likely inflammatory. Attention on  future studies is suggested. 5. Stable 14 mm linear density in the right lateral breast with low level FDG uptake. An indolent breast cancer is possible. 6. No findings for abdominal/pelvic metastatic disease or osseous metastatic disease. 7. Stable benign bilateral adrenal gland adenomas.   11/22/2022 Imaging   MRI Brain: No evidence of intracranial metastatic disease.     11/30/2022 - 02/04/2023 Chemotherapy   Patient is on Treatment Plan : LUNG SMALL CELL Cisplatin  (80) D1 + Etoposide  (100) D1-3 q21d      12/13/2022 - 01/07/2023 Radiation Therapy   RT with Dr.Morris at [facility] [place]   02/17/2023 Imaging   CT CAP: IMPRESSION: CHEST:   1. New bilateral pulmonary nodules. 2. Interval decrease in size of RIGHT hilar lymph nodes. 3. Decrease in size of previously described pulmonary nodules.   PELVIS:   1. No evidence of metastatic disease in the abdomen pelvis. 2. Stable benign adrenal adenomas. 3. Sigmoid diverticulosis without diverticulitis.   03/17/2023 Imaging   MRI brain:  No evidence of metastatic disease.   03/29/2023 Procedure   Bronchoscopy with biopsy:  Pathology: Atypical cells AFB culture: Positive for Mycobacterium   04/19/2023 Imaging   CT CAP:  Lungs/pleura: New bilobed nodular lesion in the posterior left lower lobe measures 1.5 x 0.7 cm on image 42, series 2 there are 2 new 3-4 mm left lower lobe pulmonary nodules on image 46, series 2. There is a new 2-3 mm posterior left lower lobe pulmonary nodule on image 47,  series 2. Previously seen lingular nodule adjacent to the fissure is nearly resolved. 9 x 6 mm right upper lobe nodule image 15, series 2 is essentially new. Increased size of right upper lobe nodule on image 18, series 2, now measuring 9 x 7 mm (previously this measured 7 x 5 mm). Increased size of right upper lobe lesion on image 22, series 2 which now measures 2.5 x 1.5 cm (previously this measured 0.9 x 0.7 cm). Cluster of superior right lower lobe  nodules on images 22-24, series 2 have decreased in size. 2.2 x 1.5 cm lesion on image 27, series 2 in the right lower lobe previously measured 2.4 x 1.6 cm. There is new pleural-based lesion in the posteromedial right lower lobe measuring 3.6 x 2.1 cm on image 25, series 2. Multiple new right lower lobe and right middle lobe nodules have a tree-in-bud appearance suggesting an infectious or inflammatory process. No pleural effusion. Patent central airways. Moderate upper lung predominant emphysema.  IMPRESSION: *Mixed response to therapy with some pulmonary nodules increased in size, some decreased in size, and some resolved. *Multiple new right lower lobe and right middle lobe nodules have a tree-in-bud appearance suggesting an infectious or inflammatory process. Consider short-term interval follow-up after completion of therapy to ensure resolution. *No evidence of metastatic disease in the abdomen or pelvis.   04/20/2023 -  Chemotherapy   Patient is on Treatment Plan : LUNG NSCLC Durvalumab  (1500) q28d     06/14/2023 Imaging   MRI brain:  IMPRESSION: 1. No evidence of intracranial metastasis. 2. Unchanged background of mild chronic small vessel disease.   08/10/2023 Imaging   CT chest abdomen pelvis with contrast:  IMPRESSION: 1. Multiple bilateral spiculated pulmonary nodules containing biopsy marking clips are again seen and slightly diminished in size consistent with treatment response. 2. Multiple additional areas of nodularity and consolidation are fluctuant when compared to prior examination, particularly, dense consolidation of the dependent right lower lobe is almost completely resolved, with new small nodules and consolidation of the peripheral left lung base. Findings are consistent with nonspecific superimposed atypical infection or aspiration. 3. Unchanged treated right hilar and subcarinal lymph nodes. 4. No evidence of lymphadenopathy or metastatic disease in the abdomen or  pelvis. 5. Unchanged bilateral adrenal adenomata, previously without FDG avidity. 6. Emphysema and diffuse bilateral bronchial wall thickening.       Current Treatment: Completed Chemo RT with Cisplatin  + Etoposide . On maintenance Durvalumab    INTERVAL HISTORY:  Virginia Powell is here today for follow up.  Patient tolerated previous cycle of durvalumab  well.  Reports no rash, shortness of breath, diarrhea at this time.    She reports occasional coughing, which she attributes to lung cancer.No new rash or lung infections have been noted.  Appetite is good and weight is stable.  She is working on reducing smoking.  I have reviewed the past medical history, past surgical history, social history and family history with the patient and they are unchanged from previous note.  ALLERGIES:  is allergic to morphine and codeine.  MEDICATIONS:  Current Outpatient Medications  Medication Sig Dispense Refill   albuterol (PROVENTIL) (2.5 MG/3ML) 0.083% nebulizer solution SMARTSIG:1 Vial(s) Via Nebulizer Every 4-6 Hours PRN     albuterol (VENTOLIN HFA) 108 (90 Base) MCG/ACT inhaler SMARTSIG:2 Puff(s) By Mouth Every 4 Hours     atorvastatin (LIPITOR) 40 MG tablet Take 40 mg by mouth daily.     benzonatate (TESSALON) 100 MG capsule Take 100 mg by mouth  3 (three) times daily as needed for cough.     ciprofloxacin  (CIPRO ) 500 MG tablet Take 1 tablet (500 mg total) by mouth 2 (two) times daily. 20 tablet 0   CISPLATIN  IV Inject into the vein every 21 ( twenty-one) days.     clindamycin (CLEOCIN) 300 MG capsule Take 300 mg by mouth 3 (three) times daily.     ETOPOSIDE  IV Inject into the vein every 21 ( twenty-one) days.     ferrous sulfate 324 MG TBEC Take 324 mg by mouth.     fexofenadine (ALLEGRA) 180 MG tablet Take 180 mg by mouth daily as needed for allergies or rhinitis.     fluticasone (FLONASE) 50 MCG/ACT nasal spray Place into both nostrils daily as needed.     hydrochlorothiazide  (HYDRODIURIL) 12.5 MG tablet Take 12.5 mg by mouth daily.     hydrOXYzine (ATARAX) 25 MG tablet Take 1 tablet by mouth at bedtime as needed.     lidocaine -prilocaine  (EMLA ) cream Apply a quarter-sized amount to port a cath site and cover with plastic wrap 1 hour prior to infusion appointments 30 g 3   lidocaine -prilocaine  (EMLA ) cream Apply to affected area once 30 g 3   loratadine (CLARITIN) 10 MG tablet Take 10 mg by mouth daily as needed for allergies.     olmesartan  (BENICAR ) 40 MG tablet Take 1 tablet (40 mg total) by mouth daily. 30 tablet 0   ondansetron  (ZOFRAN ) 8 MG tablet Take 1 tablet (8 mg total) by mouth every 8 (eight) hours as needed for nausea or vomiting. 30 tablet 1   prochlorperazine  (COMPAZINE ) 10 MG tablet Take 1 tablet (10 mg total) by mouth every 6 (six) hours as needed for nausea or vomiting. 30 tablet 1   rizatriptan (MAXALT-MLT) 10 MG disintegrating tablet Take 10 mg by mouth as needed for migraine. May repeat in 2 hours if needed     sucralfate  (CARAFATE ) 1 GM/10ML suspension Take 10 mLs (1 g total) by mouth 4 (four) times daily -  with meals and at bedtime. 420 mL 0   TRELEGY ELLIPTA 100-62.5-25 MCG/ACT AEPB Inhale 1 puff into the lungs daily.     No current facility-administered medications for this visit.   Facility-Administered Medications Ordered in Other Visits  Medication Dose Route Frequency Provider Last Rate Last Admin   0.9 %  sodium chloride  infusion   Intravenous Continuous Caspar Favila, MD   Stopped at 10/19/23 1218     REVIEW OF SYSTEMS:   Constitutional: Denies fevers, chills or abnormal weight loss Eyes: Denies blurriness of vision Ears, nose, mouth, throat, and face: Denies mucositis or sore throat Respiratory: Denies cough, dyspnea or wheezes Cardiovascular: Denies palpitation, chest discomfort or lower extremity swelling Gastrointestinal:  Denies nausea, heartburn or change in bowel habits Skin: Denies abnormal skin rashes Lymphatics:  Denies new lymphadenopathy or easy bruising Neurological:Denies numbness, tingling or new weaknesses Behavioral/Psych: Mood is stable, no new changes  All other systems were reviewed with the patient and are negative.   VITALS:  There were no vitals taken for this visit.  Wt Readings from Last 3 Encounters:  10/19/23 99 lb 6.8 oz (45.1 kg)  09/21/23 98 lb 5.2 oz (44.6 kg)  08/31/23 98 lb (44.5 kg)    Performance status (ECOG): 0 - Asymptomatic  PHYSICAL EXAM:   GENERAL:alert, no distress and comfortable SKIN: skin color, texture, turgor are normal, no rashes or significant lesions LUNGS: clear to auscultation and percussion with normal breathing effort HEART: regular  rate & rhythm and no murmurs and no lower extremity edema ABDOMEN:abdomen soft, non-tender and normal bowel sounds Musculoskeletal:no cyanosis of digits and no clubbing  NEURO: alert & oriented x 3 with fluent speech  LABORATORY DATA:  I have reviewed the data as listed    Component Value Date/Time   NA 138 10/19/2023 0903   K 3.9 10/19/2023 0903   CL 105 10/19/2023 0903   CO2 22 10/19/2023 0903   GLUCOSE 88 10/19/2023 0903   BUN 34 (H) 10/19/2023 0903   CREATININE 0.94 10/19/2023 0903   CALCIUM 9.1 10/19/2023 0903   PROT 6.6 10/19/2023 0903   ALBUMIN 3.7 10/19/2023 0903   AST 20 10/19/2023 0903   ALT 23 10/19/2023 0903   ALKPHOS 49 10/19/2023 0903   BILITOT 0.8 10/19/2023 0903   GFRNONAA >60 10/19/2023 0903    Lab Results  Component Value Date   WBC 5.8 10/19/2023   NEUTROABS 4.0 10/19/2023   HGB 12.3 10/19/2023   HCT 37.4 10/19/2023   MCV 98.4 10/19/2023   PLT 324 10/19/2023      Chemistry      Component Value Date/Time   NA 138 10/19/2023 0903   K 3.9 10/19/2023 0903   CL 105 10/19/2023 0903   CO2 22 10/19/2023 0903   BUN 34 (H) 10/19/2023 0903   CREATININE 0.94 10/19/2023 0903      Component Value Date/Time   CALCIUM 9.1 10/19/2023 0903   ALKPHOS 49 10/19/2023 0903   AST 20  10/19/2023 0903   ALT 23 10/19/2023 0903   BILITOT 0.8 10/19/2023 0903      Latest Reference Range & Units 04/13/23 12:35  Iron 28 - 170 ug/dL 56  UIBC ug/dL 730  TIBC 749 - 549 ug/dL 674  Saturation Ratios 10.4 - 31.8 % 17  Ferritin 11 - 307 ng/mL 103   RADIOGRAPHIC STUDIES:  I have personally reviewed the radiological images as listed and agreed with the findings in the report.   None new to review

## 2023-10-18 NOTE — Assessment & Plan Note (Signed)
-  Patient is an avid smoker with history of smoking 2 packs/day until recently -Patient continues to smoke -Discussed the risks of smoking including faster progression of cancer, inflammation causing difficulties with chemotherapy and radiation -Offered help to quit smoking, patient is reluctant at this time but stated that she has cut down a lot

## 2023-10-19 ENCOUNTER — Inpatient Hospital Stay: Attending: Oncology | Admitting: Oncology

## 2023-10-19 ENCOUNTER — Inpatient Hospital Stay

## 2023-10-19 VITALS — BP 115/83 | HR 87 | Resp 19

## 2023-10-19 DIAGNOSIS — A319 Mycobacterial infection, unspecified: Secondary | ICD-10-CM | POA: Insufficient documentation

## 2023-10-19 DIAGNOSIS — D649 Anemia, unspecified: Secondary | ICD-10-CM | POA: Insufficient documentation

## 2023-10-19 DIAGNOSIS — R1310 Dysphagia, unspecified: Secondary | ICD-10-CM | POA: Insufficient documentation

## 2023-10-19 DIAGNOSIS — E611 Iron deficiency: Secondary | ICD-10-CM | POA: Insufficient documentation

## 2023-10-19 DIAGNOSIS — D6489 Other specified anemias: Secondary | ICD-10-CM | POA: Diagnosis not present

## 2023-10-19 DIAGNOSIS — C3490 Malignant neoplasm of unspecified part of unspecified bronchus or lung: Secondary | ICD-10-CM | POA: Insufficient documentation

## 2023-10-19 DIAGNOSIS — F17210 Nicotine dependence, cigarettes, uncomplicated: Secondary | ICD-10-CM | POA: Diagnosis not present

## 2023-10-19 DIAGNOSIS — C7801 Secondary malignant neoplasm of right lung: Secondary | ICD-10-CM | POA: Diagnosis not present

## 2023-10-19 DIAGNOSIS — C7802 Secondary malignant neoplasm of left lung: Secondary | ICD-10-CM | POA: Diagnosis not present

## 2023-10-19 DIAGNOSIS — Z5112 Encounter for antineoplastic immunotherapy: Secondary | ICD-10-CM | POA: Diagnosis not present

## 2023-10-19 LAB — COMPREHENSIVE METABOLIC PANEL WITH GFR
ALT: 23 U/L (ref 0–44)
AST: 20 U/L (ref 15–41)
Albumin: 3.7 g/dL (ref 3.5–5.0)
Alkaline Phosphatase: 49 U/L (ref 38–126)
Anion gap: 11 (ref 5–15)
BUN: 34 mg/dL — ABNORMAL HIGH (ref 8–23)
CO2: 22 mmol/L (ref 22–32)
Calcium: 9.1 mg/dL (ref 8.9–10.3)
Chloride: 105 mmol/L (ref 98–111)
Creatinine, Ser: 0.94 mg/dL (ref 0.44–1.00)
GFR, Estimated: >60 mL/min (ref >60)
Glucose, Bld: 88 mg/dL (ref 70–99)
Potassium: 3.9 mmol/L (ref 3.5–5.1)
Sodium: 138 mmol/L (ref 135–145)
Total Bilirubin: 0.8 mg/dL (ref 0.0–1.2)
Total Protein: 6.6 g/dL (ref 6.5–8.1)

## 2023-10-19 LAB — CBC WITH DIFFERENTIAL/PLATELET
Abs Immature Granulocytes: 0.02 K/uL (ref 0.00–0.07)
Basophils Absolute: 0 K/uL (ref 0.0–0.1)
Basophils Relative: 0 %
Eosinophils Absolute: 0.1 K/uL (ref 0.0–0.5)
Eosinophils Relative: 2 %
HCT: 37.4 % (ref 36.0–46.0)
Hemoglobin: 12.3 g/dL (ref 12.0–15.0)
Immature Granulocytes: 0 %
Lymphocytes Relative: 18 %
Lymphs Abs: 1.1 K/uL (ref 0.7–4.0)
MCH: 32.4 pg (ref 26.0–34.0)
MCHC: 32.9 g/dL (ref 30.0–36.0)
MCV: 98.4 fL (ref 80.0–100.0)
Monocytes Absolute: 0.7 K/uL (ref 0.1–1.0)
Monocytes Relative: 12 %
Neutro Abs: 4 K/uL (ref 1.7–7.7)
Neutrophils Relative %: 68 %
Platelets: 324 K/uL (ref 150–400)
RBC: 3.8 MIL/uL — ABNORMAL LOW (ref 3.87–5.11)
RDW: 14.7 % (ref 11.5–15.5)
WBC: 5.8 K/uL (ref 4.0–10.5)
nRBC: 0 % (ref 0.0–0.2)

## 2023-10-19 LAB — MAGNESIUM: Magnesium: 2 mg/dL (ref 1.7–2.4)

## 2023-10-19 MED ORDER — SODIUM CHLORIDE 0.9 % IV SOLN
INTRAVENOUS | Status: DC
Start: 2023-10-19 — End: 2023-10-19

## 2023-10-19 MED ORDER — SODIUM CHLORIDE 0.9 % IV SOLN
1500.0000 mg | Freq: Once | INTRAVENOUS | Status: AC
  Administered 2023-10-19: 1500 mg via INTRAVENOUS
  Filled 2023-10-19: qty 30

## 2023-10-19 NOTE — Progress Notes (Signed)
 Patient has been examined by Dr. Davonna. Vital signs and labs have been reviewed by MD - ANC, Creatinine, LFTs, hemoglobin, and platelets have been reviewed by M.D. - pt may proceed with treatment.  Primary RN and pharmacy notified.

## 2023-10-19 NOTE — Patient Instructions (Signed)

## 2023-10-19 NOTE — Patient Instructions (Signed)
 [facility] [place] - A DEPT OF [facility]  Discharge Instructions: Thank you for choosing [facility] to provide your oncology and hematology care.  If you have a lab appointment with the Cancer Center - please note that after April 8th, 2024, all labs will be drawn in the cancer center.  You do not have to check in or register with the main entrance as you have in the past but will complete your check-in in the cancer center.  Wear comfortable clothing and clothing appropriate for easy access to any Portacath or PICC line.   We strive to give you quality time with your provider. You may need to reschedule your appointment if you arrive late (15 or more minutes).  Arriving late affects you and other patients whose appointments are after yours.  Also, if you miss three or more appointments without notifying the office, you may be dismissed from the clinic at the provider's discretion.      For prescription refill requests, have your pharmacy contact our office and allow 72 hours for refills to be completed.    Today you received the following chemotherapy and/or immunotherapy agents Imfinzi, return as scheduled.   To help prevent nausea and vomiting after your treatment, we encourage you to take your nausea medication as directed.  BELOW ARE SYMPTOMS THAT SHOULD BE REPORTED IMMEDIATELY: *FEVER GREATER THAN 100.4 F (38 C) OR HIGHER *CHILLS OR SWEATING *NAUSEA AND VOMITING THAT IS NOT CONTROLLED WITH YOUR NAUSEA MEDICATION *UNUSUAL SHORTNESS OF BREATH *UNUSUAL BRUISING OR BLEEDING *URINARY PROBLEMS (pain or burning when urinating, or frequent urination) *BOWEL PROBLEMS (unusual diarrhea, constipation, pain near the anus) TENDERNESS IN MOUTH AND THROAT WITH OR WITHOUT PRESENCE OF ULCERS (sore throat, sores in mouth, or a toothache) UNUSUAL RASH, SWELLING OR PAIN  UNUSUAL VAGINAL DISCHARGE OR ITCHING   Items with * indicate a potential emergency and  should be followed up as soon as possible or go to the Emergency Department if any problems should occur.  Please show the CHEMOTHERAPY ALERT CARD or IMMUNOTHERAPY ALERT CARD at check-in to the Emergency Department and triage nurse.  Should you have questions after your visit or need to cancel or reschedule your appointment, please contact [facility] [place] - A DEPT OF [facility] [phone]  and follow the prompts.  Office hours are 8:00 a.m. to 4:30 p.m. Monday - Friday. Please note that voicemails left after 4:00 p.m. may not be returned until the following business day.  We are closed weekends and major holidays. You have access to a nurse at all times for urgent questions. Please call the main number to the clinic [phone] and follow the prompts.  For any non-urgent questions, you may also contact your provider using MyChart. We now offer e-Visits for anyone 51 and older to request care online for non-urgent symptoms. For details visit [email or web address].   Also download the MyChart app! Go to the app store, search "MyChart", open the app, select [place], and log in with your MyChart username and password.

## 2023-10-19 NOTE — Progress Notes (Signed)
 Okay for treatment per Dr. Davonna. Patient tolerated therapy with no complaints voiced. Side effects with management reviewed with understanding verbalized. Port site clean and dry with no bruising or swelling noted at site. Good blood return noted before and after administration of therapy. Band aid applied. Patient left in satisfactory condition with VSS and no s/s of distress noted.

## 2023-11-05 ENCOUNTER — Other Ambulatory Visit: Payer: Self-pay

## 2023-11-07 ENCOUNTER — Ambulatory Visit (HOSPITAL_COMMUNITY)
Admission: RE | Admit: 2023-11-07 | Discharge: 2023-11-07 | Disposition: A | Discharge status: Home / Self Care | Source: Ambulatory Visit | Attending: Oncology | Admitting: Oncology

## 2023-11-07 DIAGNOSIS — C7802 Secondary malignant neoplasm of left lung: Secondary | ICD-10-CM | POA: Insufficient documentation

## 2023-11-07 DIAGNOSIS — C7801 Secondary malignant neoplasm of right lung: Secondary | ICD-10-CM | POA: Diagnosis not present

## 2023-11-07 DIAGNOSIS — I700 Atherosclerosis of aorta: Secondary | ICD-10-CM | POA: Diagnosis not present

## 2023-11-07 DIAGNOSIS — R918 Other nonspecific abnormal finding of lung field: Secondary | ICD-10-CM | POA: Diagnosis not present

## 2023-11-07 DIAGNOSIS — I6782 Cerebral ischemia: Secondary | ICD-10-CM | POA: Diagnosis not present

## 2023-11-07 DIAGNOSIS — C3490 Malignant neoplasm of unspecified part of unspecified bronchus or lung: Secondary | ICD-10-CM | POA: Diagnosis not present

## 2023-11-07 DIAGNOSIS — J439 Emphysema, unspecified: Secondary | ICD-10-CM | POA: Diagnosis not present

## 2023-11-07 MED ORDER — IOHEXOL 300 MG/ML  SOLN
100.0000 mL | Freq: Once | INTRAMUSCULAR | Status: AC | PRN
Start: 2023-11-07 — End: 2023-11-07
  Administered 2023-11-07: 100 mL via INTRAVENOUS

## 2023-11-07 MED ORDER — GADOBUTROL 1 MMOL/ML IV SOLN
5.0000 mL | Freq: Once | INTRAVENOUS | Status: AC | PRN
  Administered 2023-11-07: 5 mL via INTRAVENOUS

## 2023-11-09 DIAGNOSIS — H40023 Open angle with borderline findings, high risk, bilateral: Secondary | ICD-10-CM | POA: Diagnosis not present

## 2023-11-09 DIAGNOSIS — Z961 Presence of intraocular lens: Secondary | ICD-10-CM | POA: Diagnosis not present

## 2023-11-16 ENCOUNTER — Inpatient Hospital Stay (HOSPITAL_BASED_OUTPATIENT_CLINIC_OR_DEPARTMENT_OTHER): Admitting: Oncology

## 2023-11-16 ENCOUNTER — Inpatient Hospital Stay: Attending: Oncology

## 2023-11-16 ENCOUNTER — Inpatient Hospital Stay

## 2023-11-16 VITALS — BP 110/76 | HR 90 | Temp 97.4°F | Resp 18

## 2023-11-16 DIAGNOSIS — Z79620 Long term (current) use of immunosuppressive biologic: Secondary | ICD-10-CM | POA: Insufficient documentation

## 2023-11-16 DIAGNOSIS — N179 Acute kidney failure, unspecified: Secondary | ICD-10-CM

## 2023-11-16 DIAGNOSIS — F17210 Nicotine dependence, cigarettes, uncomplicated: Secondary | ICD-10-CM | POA: Diagnosis not present

## 2023-11-16 DIAGNOSIS — C7801 Secondary malignant neoplasm of right lung: Secondary | ICD-10-CM

## 2023-11-16 DIAGNOSIS — A319 Mycobacterial infection, unspecified: Secondary | ICD-10-CM

## 2023-11-16 DIAGNOSIS — E860 Dehydration: Secondary | ICD-10-CM | POA: Diagnosis not present

## 2023-11-16 DIAGNOSIS — Z5112 Encounter for antineoplastic immunotherapy: Secondary | ICD-10-CM | POA: Diagnosis not present

## 2023-11-16 DIAGNOSIS — C3490 Malignant neoplasm of unspecified part of unspecified bronchus or lung: Secondary | ICD-10-CM | POA: Insufficient documentation

## 2023-11-16 DIAGNOSIS — C7802 Secondary malignant neoplasm of left lung: Secondary | ICD-10-CM | POA: Diagnosis not present

## 2023-11-16 LAB — CBC WITH DIFFERENTIAL/PLATELET
Abs Immature Granulocytes: 0.03 K/uL (ref 0.00–0.07)
Basophils Absolute: 0.1 K/uL (ref 0.0–0.1)
Basophils Relative: 1 %
Eosinophils Absolute: 0.2 K/uL (ref 0.0–0.5)
Eosinophils Relative: 3 %
HCT: 39.3 % (ref 36.0–46.0)
Hemoglobin: 12.7 g/dL (ref 12.0–15.0)
Immature Granulocytes: 1 %
Lymphocytes Relative: 18 %
Lymphs Abs: 1.1 K/uL (ref 0.7–4.0)
MCH: 31.8 pg (ref 26.0–34.0)
MCHC: 32.3 g/dL (ref 30.0–36.0)
MCV: 98.5 fL (ref 80.0–100.0)
Monocytes Absolute: 0.7 K/uL (ref 0.1–1.0)
Monocytes Relative: 12 %
Neutro Abs: 4.2 K/uL (ref 1.7–7.7)
Neutrophils Relative %: 65 %
Platelets: 354 K/uL (ref 150–400)
RBC: 3.99 MIL/uL (ref 3.87–5.11)
RDW: 13.9 % (ref 11.5–15.5)
WBC: 6.4 K/uL (ref 4.0–10.5)
nRBC: 0 % (ref 0.0–0.2)

## 2023-11-16 LAB — COMPREHENSIVE METABOLIC PANEL WITH GFR
ALT: 19 U/L (ref 0–44)
AST: 21 U/L (ref 15–41)
Albumin: 4 g/dL (ref 3.5–5.0)
Alkaline Phosphatase: 63 U/L (ref 38–126)
Anion gap: 12 (ref 5–15)
BUN: 24 mg/dL — ABNORMAL HIGH (ref 8–23)
CO2: 21 mmol/L — ABNORMAL LOW (ref 22–32)
Calcium: 9.3 mg/dL (ref 8.9–10.3)
Chloride: 107 mmol/L (ref 98–111)
Creatinine, Ser: 1.02 mg/dL — ABNORMAL HIGH (ref 0.44–1.00)
GFR, Estimated: 59 mL/min — ABNORMAL LOW (ref >60)
Glucose, Bld: 95 mg/dL (ref 70–99)
Potassium: 4.3 mmol/L (ref 3.5–5.1)
Sodium: 140 mmol/L (ref 135–145)
Total Bilirubin: <0.2 mg/dL (ref 0.0–1.2)
Total Protein: 6.6 g/dL (ref 6.5–8.1)

## 2023-11-16 LAB — MAGNESIUM: Magnesium: 2.2 mg/dL (ref 1.7–2.4)

## 2023-11-16 MED ORDER — SODIUM CHLORIDE 0.9 % IV SOLN
1500.0000 mg | Freq: Once | INTRAVENOUS | Status: AC
  Administered 2023-11-16: 1500 mg via INTRAVENOUS
  Filled 2023-11-16: qty 30

## 2023-11-16 MED ORDER — SODIUM CHLORIDE 0.9 % IV SOLN: INTRAVENOUS | Status: DC

## 2023-11-16 NOTE — Progress Notes (Signed)
 Patient Care Team: Dow Longs, PA-C as PCP - General (Family Medicine) Delford Maude BROCKS, MD as PCP - Cardiology (Cardiology) Davonna Siad, MD as Medical Oncologist (Medical Oncology) Celestia Joesph SQUIBB, RN as Oncology Nurse Navigator (Medical Oncology)  Clinic Day:  11/16/2023  Referring physician: Dow Longs, PA-C   CHIEF COMPLAINT:  CC: Stage IVA Small cell lug carcinoma    ASSESSMENT & PLAN:   Assessment & Plan:  Virginia Powell  is a 73 y.o. 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 Oncology history below.  Discussed the patient's case at lung tumor board at diagnosis, consensus to treat it like a limited stage small cell lung cancer with chemoRT  Treatment response scan showed new lung nodules but biopsy consistent with Mycobacterium  Patient was evaluated for clinical trial at [facility] (Dr.Patel)  CT obtained prior to start of durvalumab  showed some new nodules, unknown at this time if it is infection versus carcinoma. 04/20/2023: Started on maintenance durvalumab   -Continue maintenance durvalumab .  Patient tolerating treatment well.  Cycle 8-day 1 today.  Reports no rash, diarrhea, shortness of breath.  Labs and physical exam stable today.  Continue with treatment today. - We reviewed the CT scan results together.  Report consistent with mixed response. - I will reach out to Dr. Dante for probable biopsy of the growing lesions to see if this is disease progression. 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 - Recent MRI brain with no evidence of disease.  Will obtain MRI brain every 3 months.  Next 1 due 01/2024.   Return to clinic in 4 weeks with labs prior to next infusion.  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   Was seen by ID and currently on observation no active treatment.  Patient reports occasional cough and shortness of breath during cough.   -Continue to follow with ID  Tobacco use Continues to smoke, reduced to one cigarette a day. Decreased urge noted.  - Encouraged continued reduction and cessation of smoking.  Mild dehydration due to insufficient fluid intake Mild dehydration likely from insufficient fluid intake, especially around CT scan time. Increased thirst reported.  - Encouraged increased fluid intake - Monitor hydration status and kidney function.   The patient understands the plans discussed today and is in agreement with them.  She knows to contact our office if she develops concerns prior to her next appointment.  The total time spent in the appointment was 30 minutes for the encounter  with patient, including review of chart and various tests results, discussions about plan of care and coordination of care plan  Siad Davonna, MD  [facility] [address] [place] [address] Dept: [phone] Dept Fax: [phone]     ONCOLOGY HISTORY:   Oncology History  Small cell carcinoma metastatic to both lungs (HCC)  08/04/2022 Imaging   CT chest without contrast: IMPRESSION: 1. Growing solid nodule of the right lower lobe measuring 8 mm and new irregular solid nodule of the left lower lobe measuring 9.3 mm. Lung-RADS 4B, suspicious. Additional imaging evaluation or consultation with Pulmonology or Thoracic Surgery recommended.   09/09/2022 PET scan   IMPRESSION: 1. Hypermetabolic right hilar adenopathy and bilateral pulmonary nodules, findings indicative of synchronous primary bronchogenic carcinomas. No  evidence of distant metastatic disease. 2. Vague slight thickening of lateral breast soft tissue with metabolism just below blood pool. Consider mammographic correlation, as clinically indicated. 3.  Slight marginal irregularity of the liver raises suspicion for cirrhosis. 4. Bilateral adrenal adenomas.   10/02/2022 Imaging   CT chest without contrast: IMPRESSION: 1. No significant change in the appearance of tracer avid nodules within the medial right lower lobe and superior segment of left lower lobe. 2. Stable appearance of tracer avid nodal conglomeration noted within the right hilum. Which is concerning for nodal metastasis. 3. Stable appearance of bilateral adrenal adenomas. No follow-up imaging recommended.   10/04/2022 Pathology Results   A. LUNG, LLL, FINE NEEDLE ASPIRATION:  - No malignant cells identified  - Granulomatous inflammation   B. LUNG, LLL, BRUSHING:  - No malignant cells identified  - Granulomatous inflammation D. LUNG, RLL, FINE NEEDLE ASPIRATION:  - No malignant cells identified   E. 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 Interval enlargement of the superior segment left lower lobe lesion with stable hypermetabolism. 2. Stable 9 mm medial right lower lobe pulmonary nodule with slightly increased SUV max. 3. Persistent hypermetabolic right hilar adenopathy. 4. New 10 mm sub solid lesion in the left lower lobe with SUV max of 2.14. This is likely inflammatory. Attention on future studies is suggested. 5. Stable 14 mm linear density in the right lateral breast with low level FDG uptake. An indolent breast cancer is possible. 6. No findings for abdominal/pelvic metastatic disease or osseous metastatic disease. 7. Stable benign bilateral adrenal gland adenomas.   11/22/2022 Imaging   MRI Brain: No evidence of intracranial metastatic disease.     11/30/2022 - 02/04/2023 Chemotherapy   Patient is on Treatment Plan : LUNG SMALL CELL Cisplatin  (80) D1 + Etoposide  (100) D1-3 q21d       12/13/2022 - 01/07/2023 Radiation Therapy   RT with Dr.Morris at [facility], [place]   02/17/2023 Imaging   CT CAP: IMPRESSION: CHEST:   1. New bilateral pulmonary nodules. 2. Interval decrease in size of RIGHT hilar lymph nodes. 3. Decrease in size of previously described pulmonary nodules.   PELVIS:   1. No evidence of metastatic disease in the abdomen pelvis. 2. Stable benign adrenal adenomas. 3. Sigmoid diverticulosis without diverticulitis.   03/17/2023 Imaging   MRI brain:  No evidence of metastatic disease.   03/29/2023 Procedure   Bronchoscopy with biopsy:  Pathology: Atypical cells AFB culture: Positive for Mycobacterium   04/19/2023 Imaging   CT CAP:  Lungs/pleura: New bilobed nodular lesion in the posterior left lower lobe measures 1.5 x 0.7 cm on image 42, series 2 there are 2 new 3-4 mm left lower lobe pulmonary nodules on image 46, series 2. There is a new 2-3 mm posterior left lower lobe pulmonary nodule on image 47, series 2. Previously seen lingular nodule adjacent to the fissure is nearly resolved. 9 x 6 mm right upper lobe nodule image 15, series 2 is essentially new. Increased size of right upper lobe nodule on image 18, series 2, now measuring 9 x 7 mm (previously this measured 7 x 5 mm). Increased size of right upper lobe lesion on image 22, series 2 which now measures 2.5 x 1.5 cm (previously this measured 0.9 x 0.7 cm). Cluster of superior right lower  lobe nodules on images 22-24, series 2 have decreased in size. 2.2 x 1.5 cm lesion on image 27, series 2 in the right lower lobe previously measured 2.4 x 1.6 cm. There is new pleural-based lesion in the posteromedial right lower lobe measuring 3.6 x 2.1 cm on image 25, series 2. Multiple new right lower lobe and right middle lobe nodules have a tree-in-bud appearance suggesting an infectious or inflammatory process. No pleural effusion. Patent central airways. Moderate upper lung predominant emphysema.  IMPRESSION: *Mixed  response to therapy with some pulmonary nodules increased in size, some decreased in size, and some resolved. *Multiple new right lower lobe and right middle lobe nodules have a tree-in-bud appearance suggesting an infectious or inflammatory process. Consider short-term interval follow-up after completion of therapy to ensure resolution. *No evidence of metastatic disease in the abdomen or pelvis.   04/20/2023 -  Chemotherapy   Patient is on Treatment Plan : LUNG NSCLC Durvalumab  (1500) q28d     06/14/2023 Imaging   MRI brain:  IMPRESSION: 1. No evidence of intracranial metastasis. 2. Unchanged background of mild chronic small vessel disease.   08/10/2023 Imaging   CT chest abdomen pelvis with contrast:  IMPRESSION: 1. Multiple bilateral spiculated pulmonary nodules containing biopsy marking clips are again seen and slightly diminished in size consistent with treatment response. 2. Multiple additional areas of nodularity and consolidation are fluctuant when compared to prior examination, particularly, dense consolidation of the dependent right lower lobe is almost completely resolved, with new small nodules and consolidation of the peripheral left lung base. Findings are consistent with nonspecific superimposed atypical infection or aspiration. 3. Unchanged treated right hilar and subcarinal lymph nodes. 4. No evidence of lymphadenopathy or metastatic disease in the abdomen or pelvis. 5. Unchanged bilateral adrenal adenomata, previously without FDG avidity. 6. Emphysema and diffuse bilateral bronchial wall thickening.   11/07/2023 Imaging   MRI brain:  No evidence of intracranial metastatic disease.    11/07/2023 Imaging   CT CAP:  Multiple spiculated bilateral pulmonary nodules again seen. Some of these are slightly diminished in size, however there is a nodule in the superior segment left lower lobe which is slightly increased in size and solid character, and there is additionally a new  nodule in the posterior right upper lobe. Findings are consistent with mixed response to treatment.   No evidence of lymphadenopathy or metastatic disease in the abdomen or pelvis.       Current Treatment: Completed Chemo RT with Cisplatin  + Etoposide . On maintenance Durvalumab    INTERVAL HISTORY:  Discussed the use of AI scribe software for clinical note transcription with the patient, who gave verbal consent to proceed.  History of Present Illness Virginia Powell is a 74 year old female with a history of cancer who presents for follow-up for small cell lung carcinoma  She reported that her breathing has improved, and she has not required the use of her inhaler recently. No rash, diarrhea, or current swallowing difficulties are reported.  She is currently receiving monthly Durvalumab . She has a history of dysphagia during radiation treatment, and she no longer requires Carafate .  She notes a decrease in kidney function, which she attributes to inadequate hydration, particularly around the time of her CT scan. She has been experiencing increased thirst and is making an effort to drink more water.  She continues to smoke but has significantly reduced her smoking habits, sometimes going a day without smoking or having only one or two cigarettes a day.  I have reviewed the past medical history, past surgical history, social history and family history with the patient and they are unchanged from previous note.  ALLERGIES:  is allergic to morphine and codeine.  MEDICATIONS:  Current Outpatient Medications  Medication Sig Dispense Refill   albuterol (PROVENTIL) (2.5 MG/3ML) 0.083% nebulizer solution SMARTSIG:1 Vial(s) Via Nebulizer Every 4-6 Hours PRN     albuterol (VENTOLIN HFA) 108 (90 Base) MCG/ACT inhaler SMARTSIG:2 Puff(s) By Mouth Every 4 Hours     atorvastatin (LIPITOR) 40 MG tablet Take 40 mg by mouth daily.     benzonatate (TESSALON) 100 MG capsule Take 100 mg by mouth 3  (three) times daily as needed for cough.     ciprofloxacin  (CIPRO ) 500 MG tablet Take 1 tablet (500 mg total) by mouth 2 (two) times daily. 20 tablet 0   clindamycin (CLEOCIN) 300 MG capsule Take 300 mg by mouth 3 (three) times daily.     ferrous sulfate 324 MG TBEC Take 324 mg by mouth.     fexofenadine (ALLEGRA) 180 MG tablet Take 180 mg by mouth daily as needed for allergies or rhinitis.     fluticasone (FLONASE) 50 MCG/ACT nasal spray Place into both nostrils daily as needed.     hydrochlorothiazide (HYDRODIURIL) 12.5 MG tablet Take 12.5 mg by mouth daily.     hydrOXYzine (ATARAX) 25 MG tablet Take 1 tablet by mouth at bedtime as needed.     lidocaine -prilocaine  (EMLA ) cream Apply a quarter-sized amount to port a cath site and cover with plastic wrap 1 hour prior to infusion appointments 30 g 3   lidocaine -prilocaine  (EMLA ) cream Apply to affected area once 30 g 3   loratadine (CLARITIN) 10 MG tablet Take 10 mg by mouth daily as needed for allergies.     olmesartan  (BENICAR ) 40 MG tablet Take 1 tablet (40 mg total) by mouth daily. 30 tablet 0   ondansetron  (ZOFRAN ) 8 MG tablet Take 1 tablet (8 mg total) by mouth every 8 (eight) hours as needed for nausea or vomiting. 30 tablet 1   prochlorperazine  (COMPAZINE ) 10 MG tablet Take 1 tablet (10 mg total) by mouth every 6 (six) hours as needed for nausea or vomiting. 30 tablet 1   rizatriptan (MAXALT-MLT) 10 MG disintegrating tablet Take 10 mg by mouth as needed for migraine. May repeat in 2 hours if needed     sucralfate  (CARAFATE ) 1 GM/10ML suspension Take 10 mLs (1 g total) by mouth 4 (four) times daily -  with meals and at bedtime. 420 mL 0   TRELEGY ELLIPTA 100-62.5-25 MCG/ACT AEPB Inhale 1 puff into the lungs daily.     No current facility-administered medications for this visit.   Facility-Administered Medications Ordered in Other Visits  Medication Dose Route Frequency Provider Last Rate Last Admin   0.9 %  sodium chloride  infusion    Intravenous Continuous Lavern Maslow, MD   Stopped at 11/16/23 1227     REVIEW OF SYSTEMS:   Constitutional: Denies fevers, chills or abnormal weight loss Eyes: Denies blurriness of vision Ears, nose, mouth, throat, and face: Denies mucositis or sore throat Respiratory: Denies cough, dyspnea or wheezes Cardiovascular: Denies palpitation, chest discomfort or lower extremity swelling Gastrointestinal:  Denies nausea, heartburn or change in bowel habits Skin: Denies abnormal skin rashes Lymphatics: Denies new lymphadenopathy or easy bruising Neurological:Denies numbness, tingling or new weaknesses Behavioral/Psych: Mood is stable, no new changes  All other systems were reviewed with the patient and are negative.   VITALS:  There were no vitals taken for this visit.  Wt Readings from Last 3 Encounters:  11/16/23 99 lb 13.9 oz (45.3 kg)  10/19/23 99 lb 6.8 oz (45.1 kg)  09/21/23 98 lb 5.2 oz (44.6 kg)    Performance status (ECOG): 0 - Asymptomatic  PHYSICAL EXAM:   GENERAL:alert, no distress and comfortable SKIN: skin color, texture, turgor are normal, no rashes or significant lesions LUNGS: clear to auscultation and percussion with normal breathing effort HEART: regular rate & rhythm and no murmurs and no lower extremity edema ABDOMEN:abdomen soft, non-tender and normal bowel sounds Musculoskeletal:no cyanosis of digits and no clubbing  NEURO: alert & oriented x 3 with fluent speech  LABORATORY DATA:  I have reviewed the data as listed    Component Value Date/Time   NA 140 11/16/2023 0852   K 4.3 11/16/2023 0852   CL 107 11/16/2023 0852   CO2 21 (L) 11/16/2023 0852   GLUCOSE 95 11/16/2023 0852   BUN 24 (H) 11/16/2023 0852   CREATININE 1.02 (H) 11/16/2023 0852   CALCIUM 9.3 11/16/2023 0852   PROT 6.6 11/16/2023 0852   ALBUMIN 4.0 11/16/2023 0852   AST 21 11/16/2023 0852   ALT 19 11/16/2023 0852   ALKPHOS 63 11/16/2023 0852   BILITOT <0.2 11/16/2023 0852    GFRNONAA 59 (L) 11/16/2023 0852    Lab Results  Component Value Date   WBC 6.4 11/16/2023   NEUTROABS 4.2 11/16/2023   HGB 12.7 11/16/2023   HCT 39.3 11/16/2023   MCV 98.5 11/16/2023   PLT 354 11/16/2023      Chemistry      Component Value Date/Time   NA 140 11/16/2023 0852   K 4.3 11/16/2023 0852   CL 107 11/16/2023 0852   CO2 21 (L) 11/16/2023 0852   BUN 24 (H) 11/16/2023 0852   CREATININE 1.02 (H) 11/16/2023 0852      Component Value Date/Time   CALCIUM 9.3 11/16/2023 0852   ALKPHOS 63 11/16/2023 0852   AST 21 11/16/2023 0852   ALT 19 11/16/2023 0852   BILITOT <0.2 11/16/2023 0852      RADIOGRAPHIC STUDIES:  I have personally reviewed the radiological images as listed and agreed with the findings in the report.   CT CHEST ABDOMEN PELVIS W CONTRAST CLINICAL DATA:  Metastatic lung cancer restaging * Tracking Code: BO *  EXAM: CT CHEST, ABDOMEN, AND PELVIS WITH CONTRAST  TECHNIQUE: Multidetector CT imaging of the chest, abdomen and pelvis was performed following the standard protocol during bolus administration of intravenous contrast.  RADIATION DOSE REDUCTION: This exam was performed according to the departmental dose-optimization program which includes automated exposure control, adjustment of the mA and/or kV according to patient size and/or use of iterative reconstruction technique.  CONTRAST:  OMNIPAQUE  IOHEXOL  300 MG/ML  SOLN  COMPARISON:  08/10/2023  FINDINGS: CT CHEST FINDINGS  Cardiovascular: Right chest port catheter. Aortic atherosclerosis. Normal heart size. No pericardial effusion.  Mediastinum/Nodes: No enlarged mediastinal, hilar, or axillary lymph nodes. Thyroid  gland, trachea, and esophagus demonstrate no significant findings.  Lungs/Pleura: Severe emphysema. Multiple spiculated bilateral pulmonary nodules again seen. Some of these are slightly diminished in size, for example in the inferior right upper lobe containing  a biopsy marking clip measuring 1.5 x 0.7 cm, previously 1.6 x 1.0 cm (series 3, image 51) and in the medial right lower lobe measuring 2.1 x 1.5 cm, previously 2.4 x 1.7 cm (series 3, image 66). There is however a nodule in the superior segment  left lower lobe which is slightly increased in size and solid character, measuring 1.1 x 1.0 cm, previously 0.9 x 0.7 cm (series 3, image 54). There is additionally a new nodule in the posterior right upper lobe measuring 0.9 x 0.6 cm (series 3, image 38). No pleural effusion or pneumothorax.  Musculoskeletal: No chest wall abnormality. No acute osseous findings.  CT ABDOMEN PELVIS FINDINGS  Hepatobiliary: No solid liver abnormality is seen. No gallstones, gallbladder wall thickening, or biliary dilatation.  Pancreas: Unremarkable. No pancreatic ductal dilatation or surrounding inflammatory changes.  Spleen: Normal in size without significant abnormality.  Adrenals/Urinary Tract: Unchanged bilateral adrenal adenomata, previously without FDG avidity (series 2, image 55). Kidneys are normal, without renal calculi, solid lesion, or hydronephrosis. Bladder is unremarkable.  Stomach/Bowel: Stomach is within normal limits. Appendix appears normal. No evidence of bowel wall thickening, distention, or inflammatory changes. Sigmoid diverticulosis.  Vascular/Lymphatic: Aortic atherosclerosis. No enlarged abdominal or pelvic lymph nodes.  Reproductive: Hysterectomy.  Other: No abdominal wall hernia or abnormality. No ascites.  Musculoskeletal: No acute osseous findings.  IMPRESSION: 1. Multiple spiculated bilateral pulmonary nodules again seen. Some of these are slightly diminished in size, however there is a nodule in the superior segment left lower lobe which is slightly increased in size and solid character, and there is additionally a new nodule in the posterior right upper lobe. Findings are consistent with mixed response to  treatment. 2. No evidence of lymphadenopathy or metastatic disease in the abdomen or pelvis. 3. Emphysema.  Aortic Atherosclerosis (ICD10-I70.0) and Emphysema (ICD10-J43.9).  Electronically Signed   By: Marolyn JONETTA Jaksch M.D.   On: 11/09/2023 18:46  EXAM: MRI HEAD WITHOUT AND WITH CONTRAST   TECHNIQUE: Multiplanar, multiecho pulse sequences of the brain and surrounding structures were obtained without and with intravenous contrast.   CONTRAST:  5mL GADAVIST  GADOBUTROL  1 MMOL/ML IV SOLN   COMPARISON:  Brain MRI 06/14/2023.   FINDINGS: Mild intermittent motion degradation. Within this limitation, findings are as follows.   Brain:   Mild generalized cerebral atrophy.   Multifocal T2 FLAIR hyperintense signal abnormality within the cerebral white matter and pons, nonspecific but compatible with mild chronic small vessel ischemic disease.   Chronic microhemorrhage within the anterior left temporal lobe, unchanged.   There is no acute infarct.   No evidence of an intracranial mass.   No extra-axial fluid collection.   No midline shift.   No pathologic intracranial enhancement identified.   Vascular: Maintained flow voids within the proximal large arterial vessels.   Skull and upper cervical spine: No focal worrisome marrow lesion. Facet arthropathy within the cervical spine at the visible levels.   Sinuses/Orbits: No mass or acute finding within the imaged orbits. Prior bilateral ocular lens replacement. Mild right maxillary sinusitis. 12 mm mucous retention cyst within the left maxillary sinus. Mild-to-moderate right sphenoid sinusitis. Moderate-sized fluid level within the left sphenoid sinus.   IMPRESSION: 1. Mildly motion degraded exam. 2. No evidence of intracranial metastatic disease. 3. Mild chronic small vessel ischemic changes within the cerebral white matter. 4. Paranasal sinus disease as described. Correlate for signs/symptoms of acute sinusitis.      Electronically Signed   By: Rockey Childs D.O.   On: 11/08/2023 11:01

## 2023-11-16 NOTE — Progress Notes (Signed)
 Patient presents today for chemotherapy infusion of Imfinzi . Patient is in satisfactory condition with no new complaints voiced.  Vital signs are stable.  Labs reviewed by Dr. Davonna during the office visit and all labs are within treatment parameters.  We will proceed with treatment per MD orders.   Patient tolerated treatment well with no complaints voiced.  Patient left ambulatory in stable condition.  Vital signs stable at discharge.  Follow up as scheduled.

## 2023-11-16 NOTE — Patient Instructions (Signed)
 [facility] - A DEPT OF [facility]  Discharge Instructions: Thank you for choosing [facility] to provide your oncology and hematology care.  If you have a lab appointment with the Cancer Center - please note that after April 8th, 2024, all labs will be drawn in the cancer center.  You do not have to check in or register with the main entrance as you have in the past but will complete your check-in in the cancer center.  Wear comfortable clothing and clothing appropriate for easy access to any Portacath or PICC line.   We strive to give you quality time with your provider. You may need to reschedule your appointment if you arrive late (15 or more minutes).  Arriving late affects you and other patients whose appointments are after yours.  Also, if you miss three or more appointments without notifying the office, you may be dismissed from the clinic at the provider's discretion.      For prescription refill requests, have your pharmacy contact our office and allow 72 hours for refills to be completed.    Today you received the following chemotherapy and/or immunotherapy agents Imfinzi.  Durvalumab Injection What is this medication? DURVALUMAB (dur VAL ue mab) treats some types of cancer. It works by helping your immune system slow or stop the spread of cancer cells. It is a monoclonal antibody. This medicine may be used for other purposes; ask your health care provider or pharmacist if you have questions. COMMON BRAND NAME(S): IMFINZI What should I tell my care team before I take this medication? They need to know if you have any of these conditions: Allogeneic stem cell transplant (uses someone else's stem cells) Autoimmune diseases, such as Crohn disease, ulcerative colitis, lupus History of chest radiation Nervous system problems, such as Guillain-Barre syndrome, myasthenia gravis Organ transplant An unusual or allergic reaction to durvalumab,  other medications, foods, dyes, or preservatives Pregnant or trying to get pregnant Breast-feeding How should I use this medication? This medication is infused into a vein. It is given by your care team in a hospital or clinic setting. A special MedGuide will be given to you before each treatment. Be sure to read this information carefully each time. Talk to your care team about the use of this medication in children. Special care may be needed. Overdosage: If you think you have taken too much of this medicine contact a poison control center or emergency room at once. NOTE: This medicine is only for you. Do not share this medicine with others. What if I miss a dose? Keep appointments for follow-up doses. It is important not to miss your dose. Call your care team if you are unable to keep an appointment. What may interact with this medication? Interactions have not been studied. This list may not describe all possible interactions. Give your health care provider a list of all the medicines, herbs, non-prescription drugs, or dietary supplements you use. Also tell them if you smoke, drink alcohol, or use illegal drugs. Some items may interact with your medicine. What should I watch for while using this medication? Your condition will be monitored carefully while you are receiving this medication. You may need blood work while taking this medication. This medication may cause serious skin reactions. They can happen weeks to months after starting the medication. Contact your care team right away if you notice fevers or flu-like symptoms with a rash. The rash may be red or  purple and then turn into blisters or peeling of the skin. You may also notice a red rash with swelling of the face, lips, or lymph nodes in your neck or under your arms. Tell your care team right away if you have any change in your eyesight. Talk to your care team if you may be pregnant. Serious birth defects can occur if you take  this medication during pregnancy and for 3 months after the last dose. You will need a negative pregnancy test before starting this medication. Contraception is recommended while taking this medication and for 3 months after the last dose. Your care team can help you find the option that works for you. Do not breastfeed while taking this medication and for 3 months after the last dose. What side effects may I notice from receiving this medication? Side effects that you should report to your care team as soon as possible: Allergic reactions--skin rash, itching, hives, swelling of the face, lips, tongue, or throat Dry cough, shortness of breath or trouble breathing Eye pain, redness, irritation, or discharge with blurry or decreased vision Heart muscle inflammation--unusual weakness or fatigue, shortness of breath, chest pain, fast or irregular heartbeat, dizziness, swelling of the ankles, feet, or hands Hormone gland problems--headache, sensitivity to light, unusual weakness or fatigue, dizziness, fast or irregular heartbeat, increased sensitivity to cold or heat, excessive sweating, constipation, hair loss, increased thirst or amount of urine, tremors or shaking, irritability Infusion reactions--chest pain, shortness of breath or trouble breathing, feeling faint or lightheaded Kidney injury (glomerulonephritis)--decrease in the amount of urine, red or dark brown urine, foamy or bubbly urine, swelling of the ankles, hands, or feet Liver injury--right upper belly pain, loss of appetite, nausea, light-colored stool, dark yellow or brown urine, yellowing skin or eyes, unusual weakness or fatigue Pain, tingling, or numbness in the hands or feet, muscle weakness, change in vision, confusion or trouble speaking, loss of balance or coordination, trouble walking, seizures Rash, fever, and swollen lymph nodes Redness, blistering, peeling, or loosening of the skin, including inside the mouth Sudden or severe  stomach pain, bloody diarrhea, fever, nausea, vomiting Side effects that usually do not require medical attention (report these to your care team if they continue or are bothersome): Bone, joint, or muscle pain Diarrhea Fatigue Loss of appetite Nausea Skin rash This list may not describe all possible side effects. Call your doctor for medical advice about side effects. You may report side effects to FDA at 1-800-FDA-1088. Where should I keep my medication? This medication is given in a hospital or clinic. It will not be stored at home. NOTE: This sheet is a summary. It may not cover all possible information. If you have questions about this medicine, talk to your doctor, pharmacist, or health care provider.  2024 Elsevier/Gold Standard (2021-06-16 00:00:00)        To help prevent nausea and vomiting after your treatment, we encourage you to take your nausea medication as directed.  BELOW ARE SYMPTOMS THAT SHOULD BE REPORTED IMMEDIATELY: *FEVER GREATER THAN 100.4 F (38 C) OR HIGHER *CHILLS OR SWEATING *NAUSEA AND VOMITING THAT IS NOT CONTROLLED WITH YOUR NAUSEA MEDICATION *UNUSUAL SHORTNESS OF BREATH *UNUSUAL BRUISING OR BLEEDING *URINARY PROBLEMS (pain or burning when urinating, or frequent urination) *BOWEL PROBLEMS (unusual diarrhea, constipation, pain near the anus) TENDERNESS IN MOUTH AND THROAT WITH OR WITHOUT PRESENCE OF ULCERS (sore throat, sores in mouth, or a toothache) UNUSUAL RASH, SWELLING OR PAIN  UNUSUAL VAGINAL DISCHARGE OR ITCHING   Items  with * indicate a potential emergency and should be followed up as soon as possible or go to the Emergency Department if any problems should occur.  Please show the CHEMOTHERAPY ALERT CARD or IMMUNOTHERAPY ALERT CARD at check-in to the Emergency Department and triage nurse.  Should you have questions after your visit or need to cancel or reschedule your appointment, please contact [facility] [place] - A DEPT OF [facility]igh[facility] [phone]  and follow the prompts.  Office hours are 8:00 a.m. to 4:30 p.m. Monday - Friday. Please note that voicemails left after 4:00 p.m. may not be returned until the following business day.  We are closed weekends and major holidays. You have access to a nurse at all times for urgent questions. Please call the main number to the clinic [phone] and follow the prompts.  For any non-urgent questions, you may also contact your provider using MyChart. We now offer e-Visits for anyone 68 and older to request care online for non-urgent symptoms. For details visit [email or web address].   Also download the MyChart app! Go to the app store, search "MyChart", open the app, select [place], and log in with your MyChart username and password.

## 2023-11-16 NOTE — Progress Notes (Signed)
 Labs reviewed by MD today at office visit, ok to treat today .

## 2023-11-18 ENCOUNTER — Other Ambulatory Visit: Payer: Self-pay

## 2023-11-23 ENCOUNTER — Other Ambulatory Visit: Payer: Self-pay | Admitting: *Deleted

## 2023-11-30 ENCOUNTER — Telehealth: Payer: Self-pay

## 2023-11-30 NOTE — Telephone Encounter (Signed)
 Copied from CRM [id number]. Topic: Clinical - Request for Lab/Test Order >> Nov 30, 2023 12:46 PM Russell PARAS wrote: Reason for CRM:   Pt's oncologist sent referral to clinic for biopsy and she is calling regarding the order. Pt advised she would receive call for scheduling but has not.   Requested call back    CB#  [phone], leave detailed message with good call back #  Attempted to call,patient needs ov with sarah or byrum first availbility

## 2023-12-01 NOTE — Telephone Encounter (Signed)
 This patient will need to be seen first before a Bronch can be scheduled they have a new patient appt on 12/07/23 and we have to have an order to schedule a Bronch

## 2023-12-01 NOTE — Telephone Encounter (Signed)
 Patient has been scheduled. NFN

## 2023-12-07 ENCOUNTER — Encounter: Payer: Self-pay | Admitting: Emergency Medicine

## 2023-12-07 ENCOUNTER — Telehealth: Payer: Self-pay | Admitting: Emergency Medicine

## 2023-12-07 ENCOUNTER — Other Ambulatory Visit: Payer: Self-pay | Admitting: Oncology

## 2023-12-07 ENCOUNTER — Ambulatory Visit: Admitting: Emergency Medicine

## 2023-12-07 VITALS — BP 125/85 | HR 107 | Temp 97.7°F | Ht 63.0 in | Wt 99.0 lb

## 2023-12-07 DIAGNOSIS — Z23 Encounter for immunization: Secondary | ICD-10-CM

## 2023-12-07 DIAGNOSIS — C7801 Secondary malignant neoplasm of right lung: Secondary | ICD-10-CM

## 2023-12-07 DIAGNOSIS — R918 Other nonspecific abnormal finding of lung field: Secondary | ICD-10-CM

## 2023-12-07 DIAGNOSIS — J449 Chronic obstructive pulmonary disease, unspecified: Secondary | ICD-10-CM | POA: Diagnosis not present

## 2023-12-07 NOTE — Assessment & Plan Note (Addendum)
 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  Her transbronchial biopsies on her most recent bronchoscopy in February did show atypical cells and multiple nodules.  These have decreased in size on her current Durvalumab .  That said there is enlargement of a nodule in the superior segment on the left and a new nodule in the right upper lobe.  I did discuss with her the possibility that the nodules are due to her mycobacterial disease and that the options include empiric treatment with antibiotic therapy to see if these decrease in size.  Clearly there would be value in obtaining a definitive diagnosis since she would probably have a change in her cancer therapy if these enlarging nodules represented varied response to her Durvalumab .  Based on this we have decided to repeat her bronchoscopy to characterize the changing nodules definitively.  She will need a repeat super D CT and I will arrange for this.  We reviewed your CT scan of the chest, abdomen, pelvis today.  Some of your nodules have decreased in size but another has increased in size and there is a new nodule on the right side.  Unclear whether this is related to your mycobacterial infection or to your history of lung cancer. We will arrange for a repeat navigational bronchoscopy.  This will be done as an outpatient under general anesthesia at [facility].  You will need a designated driver and someone to watch you that day after the procedure.  We will try to get this done on 11//25. We will repeat your CT scan of the chest to help facilitate your bronchoscopy

## 2023-12-07 NOTE — Assessment & Plan Note (Signed)
 Continue your Trelegy once daily.  Rinse and gargle after using. Keep your albuterol available use 2 puffs when needed for shortness of breath, chest tightness, wheezing. Flu shot today Follow in our office after your bronchoscopy so we can review results.

## 2023-12-07 NOTE — Progress Notes (Signed)
 Subjective:    Patient ID: Virginia Powell, female    DOB: [birth year], 73 y.o.   MRN: [id number]  Shortness of Breath   73 year old active smoker (90 pack years) with a history of COPD, hypertension, hyperlipidemia, psoriasis, seasonal allergies.  She has been followed by Dr. Darlean for her COPD.  She participates in lung cancer screening program.  She is here to discuss abnormal chest imaging.  She reports that she has been fairly stable - she has some exertional SOB, managed on trelegy. She uses atrovent HFA, helps her clear mucous. Uses albuterol nebs rarely.   Lung cancer screening CT chest 08/04/2022 reviewed by me showed severe centrilobular emphysema, 8 mm right lower lobe pulmonary nodule that is increased from 6 mm on her prior scan 12/2021.  There is a new irregular solid left lower lobe nodule measuring 9 mm.  PET scan 09/09/2022 reviewed by me shows some evidence for hypermetabolism in the irregular left lower lobe nodule, no hypermetabolism in the enlarging right lower lobe 8 mm pulmonary nodule, evidence for hilar nodal hypermetabolism concerning for metastasis.  I do not see any evidence for distant disease although final read on the study is pending.  ROV 12/07/2023 --Virginia Powell is 81 with history of tobacco use and associated COPD, small cell lung cancer that was diagnosed in 09/2022 that was treated with chemoradiation.  A repeat bronchoscopy was done in 03/29/2023 due to concern about possible recurrence.  Atypical cells were seen in the right lower lobe, left lower lobe superior segment and the right upper lobe.  Cultures showed Mycobacterium abscessus and Pseudomonas.  She has been seen by ID and is on observation, has not had dedicated treatment for this.  Currently on maintenance Durvalumab .  She had a repeat CT scan of the chest, abdomen, pelvis on 11/07/2023 Remains on trelegy, albuterol 0-1x a day. She had a flare in May, has improved. Needs the flu shot.   CT scan of the  chest, abdomen, pelvis 11/07/2023 reviewed by me, shows no hilar or mediastinal adenopathy, severe emphysema, multiple spiculated bilateral pulmonary nodules some of which are decreased in size including inferior right upper lobe, medial right lower lobe.  There are other nodules that are increased in size including a superior segment left lower lobe nodule now 1.1 x 1.0 cm.  Is also a new nodule in the posterior right upper lobe 9 x 6 mm.    Review of Systems  Respiratory:  Positive for shortness of breath.    As per HPI  Past Medical History:  Diagnosis Date   Complication of anesthesia    COPD (chronic obstructive pulmonary disease) (HCC)    Dyspnea    Essential hypertension    Headache    Hyperlipidemia    Hypertension    Migraines    Pneumonia 04/2018   PONV (postoperative nausea and vomiting)    Psoriasis    Seasonal allergies    Smoker      Family History  Problem Relation Age of Onset   Heart attack Brother    Hyperlipidemia Brother    Hyperlipidemia Mother      Social History   Socioeconomic History   Marital status: Married    Spouse name: Not on file   Number of children: Not on file   Years of education: Not on file   Highest education level: Not on file  Occupational History   Not on file  Tobacco Use   Smoking status: Some Days  Current packs/day: 1.62    Average packs/day: 1.6 packs/day for 55.0 years (89.1 ttl pk-yrs)    Types: Cigarettes   Smokeless tobacco: Never   Tobacco comments:    Smoking 1-2 puffs. Trying to quit 12/07/2023  Vaping Use   Vaping status: Never Used  Substance and Sexual Activity   Alcohol  use: Not Currently    Comment: social   Drug use: Never   Sexual activity: Not on file  Other Topics Concern   Not on file  Social History Narrative   ** Merged History Encounter **       Social Drivers of Corporate investment banker Strain: Not on file  Food Insecurity: Not on file  Transportation Needs: Not on file   Physical Activity: Not on file  Stress: Not on file  Social Connections: Not on file  Intimate Partner Violence: Not on file     Allergies  Allergen Reactions   Morphine And Codeine Nausea And Vomiting     Outpatient Medications Prior to Visit  Medication Sig Dispense Refill   albuterol (PROVENTIL) (2.5 MG/3ML) 0.083% nebulizer solution SMARTSIG:1 Vial(s) Via Nebulizer Every 4-6 Hours PRN     albuterol (VENTOLIN HFA) 108 (90 Base) MCG/ACT inhaler SMARTSIG:2 Puff(s) By Mouth Every 4 Hours     atorvastatin (LIPITOR) 40 MG tablet Take 40 mg by mouth daily.     benzonatate (TESSALON) 100 MG capsule Take 100 mg by mouth 3 (three) times daily as needed for cough.     ciprofloxacin  (CIPRO ) 500 MG tablet Take 1 tablet (500 mg total) by mouth 2 (two) times daily. 20 tablet 0   clindamycin (CLEOCIN) 300 MG capsule Take 300 mg by mouth 3 (three) times daily.     ferrous sulfate 324 MG TBEC Take 324 mg by mouth.     fexofenadine (ALLEGRA) 180 MG tablet Take 180 mg by mouth daily as needed for allergies or rhinitis.     fluticasone (FLONASE) 50 MCG/ACT nasal spray Place into both nostrils daily as needed.     hydrochlorothiazide (HYDRODIURIL) 12.5 MG tablet Take 12.5 mg by mouth daily.     hydrOXYzine (ATARAX) 25 MG tablet Take 1 tablet by mouth at bedtime as needed.     lidocaine -prilocaine  (EMLA ) cream Apply a quarter-sized amount to port a cath site and cover with plastic wrap 1 hour prior to infusion appointments 30 g 3   lidocaine -prilocaine  (EMLA ) cream Apply to affected area once 30 g 3   loratadine (CLARITIN) 10 MG tablet Take 10 mg by mouth daily as needed for allergies.     olmesartan  (BENICAR ) 40 MG tablet Take 1 tablet (40 mg total) by mouth daily. 30 tablet 0   ondansetron  (ZOFRAN ) 8 MG tablet Take 1 tablet (8 mg total) by mouth every 8 (eight) hours as needed for nausea or vomiting. 30 tablet 1   prochlorperazine  (COMPAZINE ) 10 MG tablet Take 1 tablet (10 mg total) by mouth every 6  (six) hours as needed for nausea or vomiting. 30 tablet 1   rizatriptan (MAXALT-MLT) 10 MG disintegrating tablet Take 10 mg by mouth as needed for migraine. May repeat in 2 hours if needed     sucralfate  (CARAFATE ) 1 GM/10ML suspension Take 10 mLs (1 g total) by mouth 4 (four) times daily -  with meals and at bedtime. 420 mL 0   TRELEGY ELLIPTA 100-62.5-25 MCG/ACT AEPB Inhale 1 puff into the lungs daily.     No facility-administered medications prior to visit.  Objective:   Physical Exam  Vitals:   12/07/23 1121  BP: 125/85  Pulse: (!) 107  Temp: 97.7 F (36.5 C)  TempSrc: Oral  SpO2: 98%  Weight: 99 lb (44.9 kg)  Height: 5' 3 (1.6 m)   Gen: Pleasant, thin, in no distress,  normal affect  ENT: No lesions,  mouth clear,  oropharynx clear, no postnasal drip  Neck: No JVD, no stridor  Lungs: No use of accessory muscles, no crackles or wheezing on normal respiration, bilateral wheezes on forced expiration  Cardiovascular: RRR, heart sounds normal, no murmur or gallops, no peripheral edema  Musculoskeletal: No deformities, no cyanosis or clubbing  Neuro: alert, awake, non focal  Skin: Warm, no lesions or rash      Assessment & Plan:  COPD GOLD 2 Continue your Trelegy once daily.  Rinse and gargle after using. Keep your albuterol available use 2 puffs when needed for shortness of breath, chest tightness, wheezing. Flu shot today Follow in our office after your bronchoscopy so we can review results.  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  Her transbronchial biopsies on her most recent bronchoscopy in February did show atypical cells and multiple nodules.  These have decreased in size on her current Durvalumab .  That said there is enlargement of a nodule in the superior segment on the left and a new nodule in the right upper lobe.  I did  discuss with her the possibility that the nodules are due to her mycobacterial disease and that the options include empiric treatment with antibiotic therapy to see if these decrease in size.  Clearly there would be value in obtaining a definitive diagnosis since she would probably have a change in her cancer therapy if these enlarging nodules represented varied response to her Durvalumab .  Based on this we have decided to repeat her bronchoscopy to characterize the changing nodules definitively.  She will need a repeat super D CT and I will arrange for this.  We reviewed your CT scan of the chest, abdomen, pelvis today.  Some of your nodules have decreased in size but another has increased in size and there is a new nodule on the right side.  Unclear whether this is related to your mycobacterial infection or to your history of lung cancer. We will arrange for a repeat navigational bronchoscopy.  This will be done as an outpatient under general anesthesia at  endoscopy.  You will need a designated driver and someone to watch you that day after the procedure.  We will try to get this done on 11//25. We will repeat your CT scan of the chest to help facilitate your bronchoscopy  I personally spent a total of 44 minutes in the care of the patient today including preparing to see the patient, performing a medically appropriate exam/evaluation, placing orders, documenting clinical information in the EHR, independently interpreting results, communicating results, and coordinating care.   Lamar Chris, MD, PhD 12/07/2023, 12:26 PM [facility] [phone] or if no answer before 7:00PM call [phone] For any issues after 7:00PM please call eLink [phone]

## 2023-12-07 NOTE — Telephone Encounter (Signed)
 Please schedule the following:  Provider performing procedure: Lakeishia Truluck Diagnosis: Left lower lobe and right upper lobe pulmonary nodules Which side if for nodule / mass?  Bilateral Procedure: Robotic assisted navigational bronchoscopy Has patient been spoken to by Provider and given informed consent?  Yes Anesthesia: General Do you need Fluro?  Yes Duration of procedure: 60 minutes Date: 12/20/23 Alternate Date:   Time: Any Location: [facility] Does patient have OSA?  No DM?  No or Latex allergy?  No Medication Restriction/ Anticoagulate/Antiplatelet: None Pre-op Labs Ordered:determined by Anesthesia Imaging request: Patient needs a new super D CT chest, ordered today (If, SuperDimension CT Chest, please have STAT courier sent to ENDO)

## 2023-12-07 NOTE — H&P (View-Only) (Signed)
 Subjective:    Patient ID: Virginia Powell, female    DOB: [birth year], 73 y.o.   MRN: [id number]  Shortness of Breath   73 year old active smoker (90 pack years) with a history of COPD, hypertension, hyperlipidemia, psoriasis, seasonal allergies.  She has been followed by Dr. Darlean for her COPD.  She participates in lung cancer screening program.  She is here to discuss abnormal chest imaging.  She reports that she has been fairly stable - she has some exertional SOB, managed on trelegy. She uses atrovent HFA, helps her clear mucous. Uses albuterol nebs rarely.   Lung cancer screening CT chest 08/04/2022 reviewed by me showed severe centrilobular emphysema, 8 mm right lower lobe pulmonary nodule that is increased from 6 mm on her prior scan 12/2021.  There is a new irregular solid left lower lobe nodule measuring 9 mm.  PET scan 09/09/2022 reviewed by me shows some evidence for hypermetabolism in the irregular left lower lobe nodule, no hypermetabolism in the enlarging right lower lobe 8 mm pulmonary nodule, evidence for hilar nodal hypermetabolism concerning for metastasis.  I do not see any evidence for distant disease although final read on the study is pending.  ROV 12/07/2023 --Virginia Powell is 73 with history of tobacco use and associated COPD, small cell lung cancer that was diagnosed in 09/2022 that was treated with chemoradiation.  A repeat bronchoscopy was done in 03/29/2023 due to concern about possible recurrence.  Atypical cells were seen in the right lower lobe, left lower lobe superior segment and the right upper lobe.  Cultures showed Mycobacterium abscessus and Pseudomonas.  She has been seen by ID and is on observation, has not had dedicated treatment for this.  Currently on maintenance Durvalumab .  She had a repeat CT scan of the chest, abdomen, pelvis on 11/07/2023 Remains on trelegy, albuterol 0-1x a day. She had a flare in May, has improved. Needs the flu shot.   CT scan of the  chest, abdomen, pelvis 11/07/2023 reviewed by me, shows no hilar or mediastinal adenopathy, severe emphysema, multiple spiculated bilateral pulmonary nodules some of which are decreased in size including inferior right upper lobe, medial right lower lobe.  There are other nodules that are increased in size including a superior segment left lower lobe nodule now 1.1 x 1.0 cm.  Is also a new nodule in the posterior right upper lobe 9 x 6 mm.    Review of Systems  Respiratory:  Positive for shortness of breath.    As per HPI  Past Medical History:  Diagnosis Date   Complication of anesthesia    COPD (chronic obstructive pulmonary disease) (HCC)    Dyspnea    Essential hypertension    Headache    Hyperlipidemia    Hypertension    Migraines    Pneumonia 04/2018   PONV (postoperative nausea and vomiting)    Psoriasis    Seasonal allergies    Smoker      Family History  Problem Relation Age of Onset   Heart attack Brother    Hyperlipidemia Brother    Hyperlipidemia Mother      Social History   Socioeconomic History   Marital status: Married    Spouse name: Not on file   Number of children: Not on file   Years of education: Not on file   Highest education level: Not on file  Occupational History   Not on file  Tobacco Use   Smoking status: Some Days  Current packs/day: 1.62    Average packs/day: 1.6 packs/day for 55.0 years (89.1 ttl pk-yrs)    Types: Cigarettes   Smokeless tobacco: Never   Tobacco comments:    Smoking 1-2 puffs. Trying to quit 12/07/2023  Vaping Use   Vaping status: Never Used  Substance and Sexual Activity   Alcohol  use: Not Currently    Comment: social   Drug use: Never   Sexual activity: Not on file  Other Topics Concern   Not on file  Social History Narrative   ** Merged History Encounter **       Social Drivers of Corporate investment banker Strain: Not on file  Food Insecurity: Not on file  Transportation Needs: Not on file   Physical Activity: Not on file  Stress: Not on file  Social Connections: Not on file  Intimate Partner Violence: Not on file     Allergies  Allergen Reactions   Morphine And Codeine Nausea And Vomiting     Outpatient Medications Prior to Visit  Medication Sig Dispense Refill   albuterol (PROVENTIL) (2.5 MG/3ML) 0.083% nebulizer solution SMARTSIG:1 Vial(s) Via Nebulizer Every 4-6 Hours PRN     albuterol (VENTOLIN HFA) 108 (90 Base) MCG/ACT inhaler SMARTSIG:2 Puff(s) By Mouth Every 4 Hours     atorvastatin (LIPITOR) 40 MG tablet Take 40 mg by mouth daily.     benzonatate (TESSALON) 100 MG capsule Take 100 mg by mouth 3 (three) times daily as needed for cough.     ciprofloxacin  (CIPRO ) 500 MG tablet Take 1 tablet (500 mg total) by mouth 2 (two) times daily. 20 tablet 0   clindamycin (CLEOCIN) 300 MG capsule Take 300 mg by mouth 3 (three) times daily.     ferrous sulfate 324 MG TBEC Take 324 mg by mouth.     fexofenadine (ALLEGRA) 180 MG tablet Take 180 mg by mouth daily as needed for allergies or rhinitis.     fluticasone (FLONASE) 50 MCG/ACT nasal spray Place into both nostrils daily as needed.     hydrochlorothiazide (HYDRODIURIL) 12.5 MG tablet Take 12.5 mg by mouth daily.     hydrOXYzine (ATARAX) 25 MG tablet Take 1 tablet by mouth at bedtime as needed.     lidocaine -prilocaine  (EMLA ) cream Apply a quarter-sized amount to port a cath site and cover with plastic wrap 1 hour prior to infusion appointments 30 g 3   lidocaine -prilocaine  (EMLA ) cream Apply to affected area once 30 g 3   loratadine (CLARITIN) 10 MG tablet Take 10 mg by mouth daily as needed for allergies.     olmesartan  (BENICAR ) 40 MG tablet Take 1 tablet (40 mg total) by mouth daily. 30 tablet 0   ondansetron  (ZOFRAN ) 8 MG tablet Take 1 tablet (8 mg total) by mouth every 8 (eight) hours as needed for nausea or vomiting. 30 tablet 1   prochlorperazine  (COMPAZINE ) 10 MG tablet Take 1 tablet (10 mg total) by mouth every 6  (six) hours as needed for nausea or vomiting. 30 tablet 1   rizatriptan (MAXALT-MLT) 10 MG disintegrating tablet Take 10 mg by mouth as needed for migraine. May repeat in 2 hours if needed     sucralfate  (CARAFATE ) 1 GM/10ML suspension Take 10 mLs (1 g total) by mouth 4 (four) times daily -  with meals and at bedtime. 420 mL 0   TRELEGY ELLIPTA 100-62.5-25 MCG/ACT AEPB Inhale 1 puff into the lungs daily.     No facility-administered medications prior to visit.  Objective:   Physical Exam  Vitals:   12/07/23 1121  BP: 125/85  Pulse: (!) 107  Temp: 97.7 F (36.5 C)  TempSrc: Oral  SpO2: 98%  Weight: 99 lb (44.9 kg)  Height: 5' 3 (1.6 m)   Gen: Pleasant, thin, in no distress,  normal affect  ENT: No lesions,  mouth clear,  oropharynx clear, no postnasal drip  Neck: No JVD, no stridor  Lungs: No use of accessory muscles, no crackles or wheezing on normal respiration, bilateral wheezes on forced expiration  Cardiovascular: RRR, heart sounds normal, no murmur or gallops, no peripheral edema  Musculoskeletal: No deformities, no cyanosis or clubbing  Neuro: alert, awake, non focal  Skin: Warm, no lesions or rash      Assessment & Plan:  COPD GOLD 2 Continue your Trelegy once daily.  Rinse and gargle after using. Keep your albuterol available use 2 puffs when needed for shortness of breath, chest tightness, wheezing. Flu shot today Follow in our office after your bronchoscopy so we can review results.  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  Her transbronchial biopsies on her most recent bronchoscopy in February did show atypical cells and multiple nodules.  These have decreased in size on her current Durvalumab .  That said there is enlargement of a nodule in the superior segment on the left and a new nodule in the right upper lobe.  I did  discuss with her the possibility that the nodules are due to her mycobacterial disease and that the options include empiric treatment with antibiotic therapy to see if these decrease in size.  Clearly there would be value in obtaining a definitive diagnosis since she would probably have a change in her cancer therapy if these enlarging nodules represented varied response to her Durvalumab .  Based on this we have decided to repeat her bronchoscopy to characterize the changing nodules definitively.  She will need a repeat super D CT and I will arrange for this.  We reviewed your CT scan of the chest, abdomen, pelvis today.  Some of your nodules have decreased in size but another has increased in size and there is a new nodule on the right side.  Unclear whether this is related to your mycobacterial infection or to your history of lung cancer. We will arrange for a repeat navigational bronchoscopy.  This will be done as an outpatient under general anesthesia at  endoscopy.  You will need a designated driver and someone to watch you that day after the procedure.  We will try to get this done on 11//25. We will repeat your CT scan of the chest to help facilitate your bronchoscopy  I personally spent a total of 44 minutes in the care of the patient today including preparing to see the patient, performing a medically appropriate exam/evaluation, placing orders, documenting clinical information in the EHR, independently interpreting results, communicating results, and coordinating care.   Lamar Chris, MD, PhD 12/07/2023, 12:26 PM [facility] [phone] or if no answer before 7:00PM call [phone] For any issues after 7:00PM please call eLink [phone]

## 2023-12-07 NOTE — Telephone Encounter (Signed)
 Patient has been scheduled. [facility] has received the letter for the patient. Routing to [employer] for Authorization.

## 2023-12-07 NOTE — Patient Instructions (Signed)
 We reviewed your CT scan of the chest, abdomen, pelvis today.  Some of your nodules have decreased in size but another has increased in size and there is a new nodule on the right side.  Unclear whether this is related to your mycobacterial infection or to your history of lung cancer. We will arrange for a repeat navigational bronchoscopy.  This will be done as an outpatient under general anesthesia at [facility].  You will need a designated driver and someone to watch you that day after the procedure.  We will try to get this done on 11//25. We will repeat your CT scan of the chest to help facilitate your bronchoscopy Continue your Trelegy once daily.  Rinse and gargle after using. Keep your albuterol available use 2 puffs when needed for shortness of breath, chest tightness, wheezing. Flu shot today Follow in our office after your bronchoscopy so we can review results.

## 2023-12-08 ENCOUNTER — Other Ambulatory Visit: Payer: Self-pay

## 2023-12-13 ENCOUNTER — Ambulatory Visit
Admission: RE | Admit: 2023-12-13 | Discharge: 2023-12-13 | Disposition: A | Source: Ambulatory Visit | Attending: Emergency Medicine | Admitting: Emergency Medicine

## 2023-12-13 DIAGNOSIS — R918 Other nonspecific abnormal finding of lung field: Secondary | ICD-10-CM | POA: Diagnosis not present

## 2023-12-13 DIAGNOSIS — J439 Emphysema, unspecified: Secondary | ICD-10-CM | POA: Diagnosis not present

## 2023-12-14 ENCOUNTER — Inpatient Hospital Stay

## 2023-12-14 ENCOUNTER — Inpatient Hospital Stay: Admitting: Oncology

## 2023-12-14 VITALS — BP 110/71 | HR 80 | Temp 97.5°F | Resp 18

## 2023-12-14 VITALS — Ht 63.0 in | Wt 96.0 lb

## 2023-12-14 DIAGNOSIS — N179 Acute kidney failure, unspecified: Secondary | ICD-10-CM

## 2023-12-14 DIAGNOSIS — C7802 Secondary malignant neoplasm of left lung: Secondary | ICD-10-CM

## 2023-12-14 DIAGNOSIS — Z5112 Encounter for antineoplastic immunotherapy: Secondary | ICD-10-CM | POA: Diagnosis not present

## 2023-12-14 DIAGNOSIS — C7801 Secondary malignant neoplasm of right lung: Secondary | ICD-10-CM | POA: Diagnosis not present

## 2023-12-14 DIAGNOSIS — A319 Mycobacterial infection, unspecified: Secondary | ICD-10-CM | POA: Diagnosis not present

## 2023-12-14 DIAGNOSIS — Z720 Tobacco use: Secondary | ICD-10-CM | POA: Diagnosis not present

## 2023-12-14 LAB — CBC WITH DIFFERENTIAL/PLATELET
Abs Immature Granulocytes: 0.02 K/uL (ref 0.00–0.07)
Basophils Absolute: 0 K/uL (ref 0.0–0.1)
Basophils Relative: 1 %
Eosinophils Absolute: 0.2 K/uL (ref 0.0–0.5)
Eosinophils Relative: 5 %
HCT: 40.5 % (ref 36.0–46.0)
Hemoglobin: 13.3 g/dL (ref 12.0–15.0)
Immature Granulocytes: 0 %
Lymphocytes Relative: 26 %
Lymphs Abs: 1.4 K/uL (ref 0.7–4.0)
MCH: 31.7 pg (ref 26.0–34.0)
MCHC: 32.8 g/dL (ref 30.0–36.0)
MCV: 96.7 fL (ref 80.0–100.0)
Monocytes Absolute: 0.7 K/uL (ref 0.1–1.0)
Monocytes Relative: 13 %
Neutro Abs: 2.9 K/uL (ref 1.7–7.7)
Neutrophils Relative %: 55 %
Platelets: 329 K/uL (ref 150–400)
RBC: 4.19 MIL/uL (ref 3.87–5.11)
RDW: 14.2 % (ref 11.5–15.5)
WBC: 5.2 K/uL (ref 4.0–10.5)
nRBC: 0 % (ref 0.0–0.2)

## 2023-12-14 LAB — COMPREHENSIVE METABOLIC PANEL WITH GFR
ALT: 15 U/L (ref 0–44)
AST: 16 U/L (ref 15–41)
Albumin: 4.2 g/dL (ref 3.5–5.0)
Alkaline Phosphatase: 66 U/L (ref 38–126)
Anion gap: 13 (ref 5–15)
BUN: 38 mg/dL — ABNORMAL HIGH (ref 8–23)
CO2: 22 mmol/L (ref 22–32)
Calcium: 9.2 mg/dL (ref 8.9–10.3)
Chloride: 104 mmol/L (ref 98–111)
Creatinine, Ser: 1.2 mg/dL — ABNORMAL HIGH (ref 0.44–1.00)
GFR, Estimated: 48 mL/min — ABNORMAL LOW (ref >60)
Glucose, Bld: 128 mg/dL — ABNORMAL HIGH (ref 70–99)
Potassium: 3.9 mmol/L (ref 3.5–5.1)
Sodium: 138 mmol/L (ref 135–145)
Total Bilirubin: 0.3 mg/dL (ref 0.0–1.2)
Total Protein: 6.8 g/dL (ref 6.5–8.1)

## 2023-12-14 LAB — TSH: TSH: 2.09 u[IU]/mL (ref 0.350–4.500)

## 2023-12-14 MED ORDER — SODIUM CHLORIDE 0.9 % IV SOLN: INTRAVENOUS | Status: DC

## 2023-12-14 MED ORDER — SODIUM CHLORIDE 0.9 % IV SOLN
1500.0000 mg | Freq: Once | INTRAVENOUS | Status: AC
  Administered 2023-12-14: 1500 mg via INTRAVENOUS
  Filled 2023-12-14: qty 30

## 2023-12-14 NOTE — Patient Instructions (Signed)
 [facility] - A DEPT OF [place]  Discharge Instructions: Thank you for choosing [facility] to provide your oncology and hematology care.  If you have a lab appointment with the Cancer Center - please note that after April 8th, 2024, all labs will be drawn in the cancer center.  You do not have to check in or register with the main entrance as you have in the past but will complete your check-in in the cancer center.  Wear comfortable clothing and clothing appropriate for easy access to any Portacath or PICC line.   We strive to give you quality time with your provider. You may need to reschedule your appointment if you arrive late (15 or more minutes).  Arriving late affects you and other patients whose appointments are after yours.  Also, if you miss three or more appointments without notifying the office, you may be dismissed from the clinic at the provider's discretion.      For prescription refill requests, have your pharmacy contact our office and allow 72 hours for refills to be completed.    Today you received the following chemotherapy and/or immunotherapy agents Imfinzi / 1 Liter of NS over 2 hours      To help prevent nausea and vomiting after your treatment, we encourage you to take your nausea medication as directed.  Durvalumab  Injection What is this medication? DURVALUMAB  (dur VAL ue mab) treats some types of cancer. It works by helping your immune system slow or stop the spread of cancer cells. It is a monoclonal antibody. This medicine may be used for other purposes; ask your health care provider or pharmacist if you have questions. COMMON BRAND NAME(S): IMFINZI  What should I tell my care team before I take this medication? They need to know if you have any of these conditions: Allogeneic stem cell transplant (uses someone else's stem cells) Autoimmune diseases, such as Crohn disease, ulcerative colitis, lupus History of chest  radiation Nervous system problems, such as Guillain-Barre syndrome, myasthenia gravis Organ transplant An unusual or allergic reaction to durvalumab , other medications, foods, dyes, or preservatives Pregnant or trying to get pregnant Breast-feeding How should I use this medication? This medication is infused into a vein. It is given by your care team in a hospital or clinic setting. A special MedGuide will be given to you before each treatment. Be sure to read this information carefully each time. Talk to your care team about the use of this medication in children. Special care may be needed. Overdosage: If you think you have taken too much of this medicine contact a poison control center or emergency room at once. NOTE: This medicine is only for you. Do not share this medicine with others. What if I miss a dose? Keep appointments for follow-up doses. It is important not to miss your dose. Call your care team if you are unable to keep an appointment. What may interact with this medication? Interactions have not been studied. This list may not describe all possible interactions. Give your health care provider a list of all the medicines, herbs, non-prescription drugs, or dietary supplements you use. Also tell them if you smoke, drink alcohol , or use illegal drugs. Some items may interact with your medicine. What should I watch for while using this medication? Your condition will be monitored carefully while you are receiving this medication. You may need blood work while taking this medication. This medication may cause serious skin reactions. They  can happen weeks to months after starting the medication. Contact your care team right away if you notice fevers or flu-like symptoms with a rash. The rash may be red or purple and then turn into blisters or peeling of the skin. You may also notice a red rash with swelling of the face, lips, or lymph nodes in your neck or under your arms. Tell your care  team right away if you have any change in your eyesight. Talk to your care team if you may be pregnant. Serious birth defects can occur if you take this medication during pregnancy and for 3 months after the last dose. You will need a negative pregnancy test before starting this medication. Contraception is recommended while taking this medication and for 3 months after the last dose. Your care team can help you find the option that works for you. Do not breastfeed while taking this medication and for 3 months after the last dose. What side effects may I notice from receiving this medication? Side effects that you should report to your care team as soon as possible: Allergic reactions--skin rash, itching, hives, swelling of the face, lips, tongue, or throat Dry cough, shortness of breath or trouble breathing Eye pain, redness, irritation, or discharge with blurry or decreased vision Heart muscle inflammation--unusual weakness or fatigue, shortness of breath, chest pain, fast or irregular heartbeat, dizziness, swelling of the ankles, feet, or hands Hormone gland problems--headache, sensitivity to light, unusual weakness or fatigue, dizziness, fast or irregular heartbeat, increased sensitivity to cold or heat, excessive sweating, constipation, hair loss, increased thirst or amount of urine, tremors or shaking, irritability Infusion reactions--chest pain, shortness of breath or trouble breathing, feeling faint or lightheaded Kidney injury (glomerulonephritis)--decrease in the amount of urine, red or dark brown urine, foamy or bubbly urine, swelling of the ankles, hands, or feet Liver injury--right upper belly pain, loss of appetite, nausea, light-colored stool, dark yellow or brown urine, yellowing skin or eyes, unusual weakness or fatigue Pain, tingling, or numbness in the hands or feet, muscle weakness, change in vision, confusion or trouble speaking, loss of balance or coordination, trouble walking,  seizures Rash, fever, and swollen lymph nodes Redness, blistering, peeling, or loosening of the skin, including inside the mouth Sudden or severe stomach pain, bloody diarrhea, fever, nausea, vomiting Side effects that usually do not require medical attention (report these to your care team if they continue or are bothersome): Bone, joint, or muscle pain Diarrhea Fatigue Loss of appetite Nausea Skin rash This list may not describe all possible side effects. Call your doctor for medical advice about side effects. You may report side effects to FDA at 1-800-FDA-1088. Where should I keep my medication? This medication is given in a hospital or clinic. It will not be stored at home. NOTE: This sheet is a summary. It may not cover all possible information. If you have questions about this medicine, talk to your doctor, pharmacist, or health care provider.  2024 Elsevier/Gold Standard (2021-06-16 00:00:00)  BELOW ARE SYMPTOMS THAT SHOULD BE REPORTED IMMEDIATELY: *FEVER GREATER THAN 100.4 F (38 C) OR HIGHER *CHILLS OR SWEATING *NAUSEA AND VOMITING THAT IS NOT CONTROLLED WITH YOUR NAUSEA MEDICATION *UNUSUAL SHORTNESS OF BREATH *UNUSUAL BRUISING OR BLEEDING *URINARY PROBLEMS (pain or burning when urinating, or frequent urination) *BOWEL PROBLEMS (unusual diarrhea, constipation, pain near the anus) TENDERNESS IN MOUTH AND THROAT WITH OR WITHOUT PRESENCE OF ULCERS (sore throat, sores in mouth, or a toothache) UNUSUAL RASH, SWELLING OR PAIN  UNUSUAL VAGINAL DISCHARGE  OR ITCHING   Items with * indicate a potential emergency and should be followed up as soon as possible or go to the Emergency Department if any problems should occur.  Please show the CHEMOTHERAPY ALERT CARD or IMMUNOTHERAPY ALERT CARD at check-in to the Emergency Department and triage nurse.  Should you have questions after your visit or need to cancel or reschedule your appointment, please contact [facility] [place] - A  DEPT OF [facility] [phone]  and follow the prompts.  Office hours are 8:00 a.m. to 4:30 p.m. Monday - Friday. Please note that voicemails left after 4:00 p.m. may not be returned until the following business day.  We are closed weekends and major holidays. You have access to a nurse at all times for urgent questions. Please call the main number to the clinic [phone] and follow the prompts.  For any non-urgent questions, you may also contact your provider using MyChart. We now offer e-Visits for anyone 79 and older to request care online for non-urgent symptoms. For details visit [email or web address].   Also download the MyChart app! Go to the app store, search MyChart, open the app, select [place], and log in with your MyChart username and password.

## 2023-12-14 NOTE — Progress Notes (Signed)
 Patient Care Team: Dow Longs, PA-C as PCP - General (Family Medicine) Delford Maude BROCKS, MD as PCP - Cardiology (Cardiology) Davonna Siad, MD as Medical Oncologist (Medical Oncology) Celestia Joesph SQUIBB, RN as Oncology Nurse Navigator (Medical Oncology)  Clinic Day:  12/14/2023  Referring physician: Dow Longs, PA-C   CHIEF COMPLAINT:  CC: Stage IVA Small cell lug carcinoma    ASSESSMENT & PLAN:   Assessment & Plan:  Virginia Powell  is a 73 y.o. 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 Oncology history below.  Discussed the patient's case at lung tumor board at diagnosis, consensus to treat it like a limited stage small cell lung cancer with chemoRT  Treatment response scan showed new lung nodules but biopsy consistent with Mycobacterium  Patient was evaluated for clinical trial at [facility] (Dr.Patel)  CT obtained prior to start of durvalumab  showed some new nodules, unknown at this time if it is infection versus carcinoma. 04/20/2023: Started on maintenance durvalumab   -Continue maintenance durvalumab .  Patient tolerating treatment well.  Cycle 9-day 1 today.  Reports no rash, diarrhea, shortness of breath.   -Labs reviewed today: Creatinine: 1.2, normal LFTs, CBC: WNL. -Physical exam stable today.  Proceed with treatment today. - Patient had some lung densities that increased in size on previous CT scan.  She is scheduled to do a biopsy with Dr. Shelah next week.  She had a super D CT chest done yesterday, the read is pending at this time. -Will await these biopsy results.  If these are consistent with Mycobacterium, will reach out to ID.  If these are consistent with small cell lung carcinoma, I will reach out to Dr. Tobie at [facility] for second line treatment options.  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 - Recent MRI brain with no evidence of disease.  Will obtain MRI brain every 3 months.  Next one due 01/2024.   Return to clinic in 4 weeks with labs prior to next infusion.  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  Was seen by ID and currently on observation no active treatment.  Patient reports occasional cough and shortness of breath during cough.   -Continue to follow with ID  Tobacco use Continues to smoke, reduced to one cigarette a day. Decreased urge noted.  - Encouraged continued reduction and cessation of smoking.  Mild dehydration due to insufficient fluid intake Creatinine: 1.20 today.  Worsening from prior.  - Will give 1 L IV fluids today. - Encouraged increased fluid intake - Monitor hydration status and kidney function.   The patient understands the plans discussed today and is in agreement with them.  She knows to contact our office if she develops concerns prior to her next appointment.  The total time spent in the appointment was 20 minutes for the encounter  with patient, including review of chart and various tests results, discussions about plan of care and coordination of care plan  I, Marijo Sharps, acting as a scribe for Siad Davonna, MD.,have documented all relevant documentation on the behalf of Siad Davonna, MD,as directed by  Siad Davonna, MD while in the presence of Siad Davonna, MD.  I, Siad Davonna MD, have reviewed the above documentation for accuracy and completeness, and I agree with the above.    Siad Davonna, MD  [facility] [address] [place] [address] Dept: [phone] Dept Fax: [phone]     ONCOLOGY HISTORY:   Diagnosis:Small cell carcinoma metastatic to both lungs  -08/04/2022: CT chest without contrast: Growing solid nodule of the right lower lobe measuring 8 mm and new irregular  solid nodule of the left lower lobe measuring 9.3 mm. Lung-RADS 4B, suspicious.  -09/09/2022: PET scan:  Hypermetabolic right hilar adenopathy and bilateral pulmonary nodules, findings indicative of synchronous primary bronchogenic carcinomas. No evidence of distant metastatic disease. Vague slight thickening of lateral breast soft tissue with metabolism just below blood pool. Consider mammographic correlation, as clinically indicated. Slight marginal irregularity of the liver raises suspicion for cirrhosis. Bilateral adrenal adenomas. -10/04/2022: Pathology Results:  A. LUNG, LLL, FINE NEEDLE ASPIRATION:  - No malignant cells identified  - Granulomatous inflammation  B. LUNG, LLL, BRUSHING:  - No malignant cells identified  - Granulomatous inflammation D. LUNG, RLL, FINE NEEDLE ASPIRATION:  - No malignant cells identified  E. LYMPH NODE, 11R, FINE NEEDLE ASPIRATION:  - Small cell carcinoma  - Lymphoid tissue present  -11/18/2022: PET scan: Interval enlargement of the superior segment left lower lobe lesion with stable hypermetabolism. Stable 9 mm medial right lower lobe pulmonary nodule with slightly increased SUV max. Persistent hypermetabolic right hilar adenopathy. New 10 mm sub solid lesion in the left lower lobe with SUV max of 2.14. This is likely inflammatory. Attention on future studies is suggested. Stable 14 mm linear density in the right lateral breast with low level FDG uptake. An indolent breast cancer is possible. No findings for abdominal/pelvic metastatic disease or osseous metastatic disease. Stable benign bilateral adrenal gland adenomas. -11/22/2022: MRI Brain: No evidence of intracranial metastatic disease.  -11/30/2022-02/04/23: Chemo RT with carboplatin and Etoposide  - 12/13/2022-01/07/2023: Radiation: RT with Dr.Morris at [facility] - 02/17/2023: CT CAP: CHEST: New bilateral pulmonary nodules. Interval decrease in size of RIGHT hilar lymph nodes. Decrease in size of previously  described pulmonary nodules. PELVIS: No evidence of metastatic disease in the abdomen pelvis. Stable benign adrenal adenomas.Sigmoid diverticulosis without diverticulitis. -03/17/2023: MRI Brain: No evidence of metastatic disease. -03/29/2023: Bronchoscopy with biopsy:  Pathology: Atypical cells AFB culture: Positive for Mycobacterium -04/19/2023: CT CAP: Mixed response to therapy with some pulmonary nodules increased in size, some decreased in size, and some resolved. Multiple new right lower lobe and right middle lobe nodules have a tree-in-bud appearance suggesting an infectious or inflammatory process. Consider short-term interval follow-up after completion of therapy to ensure resolution. No evidence of metastatic disease in the abdomen or pelvis. -04/20/2023-  Current:  Maintenance durvalumab  1500mg  every 28 days -06/14/2023: MRI Brain: No evidence of intracranial metastasis. Unchanged background of mild chronic small vessel disease. -08/10/2023: CT CAP with contrast: Multiple bilateral spiculated pulmonary nodules containing biopsy marking clips are again seen and slightly diminished in size consistent with treatment response. Multiple additional areas of nodularity and consolidation are fluctuant when compared to prior examination, particularly, dense consolidation of the dependent right lower lobe is almost completely resolved, with new small nodules and consolidation of the peripheral left lung base. Findings are consistent with nonspecific superimposed atypical infection or aspiration. Unchanged treated right hilar and subcarinal lymph nodes. No evidence of lymphadenopathy or metastatic disease in the abdomen or pelvis. Unchanged bilateral adrenal adenomata, previously without FDG avidity. Emphysema and diffuse bilateral bronchial wall thickening. -11/07/2023: MRI Brain: No evidence of intracranial metastatic disease.  -11/07/2023: CT CAP: Multiple spiculated bilateral pulmonary nodules  again  seen. Some of these are slightly diminished in size, however there is a nodule in the superior segment left lower lobe which is slightly increased in size and solid character, and there is additionally a new nodule in the posterior right upper lobe. Findings are consistent with mixed response to treatment. No evidence of lymphadenopathy or metastatic disease in the abdomen or pelvis.  Current Treatment: Completed Chemo RT with Cisplatin  + Etoposide . On maintenance Durvalumab    INTERVAL HISTORY:  Virginia Powell is a 73 year old female with a history of cancer who presents for follow-up for small cell lung carcinoma. She is unaccompanied today.   Verdelle reports feeling well overall. She still notes occasionally having SOB. She states that she drinks plenty of water during the day, and sometimes as much as two bottles of water at night. She has no other complaints today.   I have reviewed the past medical history, past surgical history, social history and family history with the patient and they are unchanged from previous note.  ALLERGIES:  is allergic to morphine and codeine.  MEDICATIONS:  Current Outpatient Medications  Medication Sig Dispense Refill   albuterol (PROVENTIL) (2.5 MG/3ML) 0.083% nebulizer solution SMARTSIG:1 Vial(s) Via Nebulizer Every 4-6 Hours PRN     albuterol (VENTOLIN HFA) 108 (90 Base) MCG/ACT inhaler SMARTSIG:2 Puff(s) By Mouth Every 4 Hours     atorvastatin (LIPITOR) 40 MG tablet Take 40 mg by mouth daily.     benzonatate (TESSALON) 100 MG capsule Take 100 mg by mouth 3 (three) times daily as needed for cough.     ciprofloxacin  (CIPRO ) 500 MG tablet Take 1 tablet (500 mg total) by mouth 2 (two) times daily. 20 tablet 0   clindamycin (CLEOCIN) 300 MG capsule Take 300 mg by mouth 3 (three) times daily.     ferrous sulfate 324 MG TBEC Take 324 mg by mouth.     fexofenadine (ALLEGRA) 180 MG tablet Take 180 mg by mouth daily as needed for allergies or rhinitis.      fluticasone (FLONASE) 50 MCG/ACT nasal spray Place into both nostrils daily as needed.     hydrochlorothiazide (HYDRODIURIL) 12.5 MG tablet Take 12.5 mg by mouth daily.     hydrOXYzine (ATARAX) 25 MG tablet Take 1 tablet by mouth at bedtime as needed.     lidocaine -prilocaine  (EMLA ) cream Apply a quarter-sized amount to port a cath site and cover with plastic wrap 1 hour prior to infusion appointments 30 g 3   lidocaine -prilocaine  (EMLA ) cream Apply to affected area once 30 g 3   loratadine (CLARITIN) 10 MG tablet Take 10 mg by mouth daily as needed for allergies.     olmesartan  (BENICAR ) 40 MG tablet Take 1 tablet (40 mg total) by mouth daily. 30 tablet 0   ondansetron  (ZOFRAN ) 8 MG tablet Take 1 tablet (8 mg total) by mouth every 8 (eight) hours as needed for nausea or vomiting. 30 tablet 1   prochlorperazine  (COMPAZINE ) 10 MG tablet Take 1 tablet (10 mg total) by mouth every 6 (six) hours as needed for nausea or vomiting. 30 tablet 1   rizatriptan (MAXALT-MLT) 10 MG disintegrating tablet Take 10 mg by mouth as needed for migraine. May repeat in 2 hours if needed     sucralfate  (CARAFATE ) 1 GM/10ML suspension Take 10 mLs (1 g total) by mouth 4 (four) times daily -  with meals and at bedtime. 420 mL 0   TRELEGY ELLIPTA 100-62.5-25 MCG/ACT AEPB Inhale 1 puff into  the lungs daily.     No current facility-administered medications for this visit.   Facility-Administered Medications Ordered in Other Visits  Medication Dose Route Frequency Provider Last Rate Last Admin   0.9 %  sodium chloride  infusion   Intravenous Continuous Whitney Bingaman, MD   Stopped at 12/14/23 1300   0.9 %  sodium chloride  infusion   Intravenous Continuous Thayne Cindric, MD   Stopped at 12/14/23 1246     REVIEW OF SYSTEMS:   Constitutional: Denies fevers, chills or abnormal weight loss Eyes: Denies blurriness of vision Ears, nose, mouth, throat, and face: Denies mucositis or sore throat Respiratory: Denies cough,  dyspnea or wheezes Cardiovascular: Denies palpitation, chest discomfort or lower extremity swelling Gastrointestinal:  Denies nausea, heartburn or change in bowel habits Skin: Denies abnormal skin rashes Lymphatics: Denies new lymphadenopathy or easy bruising Neurological:Denies numbness, tingling or new weaknesses Behavioral/Psych: Mood is stable, no new changes  All other systems were reviewed with the patient and are negative.   VITALS:  Height 5' 3 (1.6 m), weight 96 lb (43.5 kg).  Wt Readings from Last 3 Encounters:  12/14/23 96 lb (43.5 kg)  12/07/23 99 lb (44.9 kg)  11/16/23 99 lb 13.9 oz (45.3 kg)    Performance status (ECOG): 0 - Asymptomatic  PHYSICAL EXAM:   GENERAL:alert, no distress and comfortable SKIN: skin color, texture, turgor are normal, no rashes or significant lesions LUNGS: clear to auscultation and percussion with normal breathing effort HEART: regular rate & rhythm and no murmurs and no lower extremity edema ABDOMEN:abdomen soft, non-tender and normal bowel sounds Musculoskeletal:no cyanosis of digits and no clubbing  NEURO: alert & oriented x 3 with fluent speech  LABORATORY DATA:  I have reviewed the data as listed    Component Value Date/Time   NA 138 12/14/2023 0911   K 3.9 12/14/2023 0911   CL 104 12/14/2023 0911   CO2 22 12/14/2023 0911   GLUCOSE 128 (H) 12/14/2023 0911   BUN 38 (H) 12/14/2023 0911   CREATININE 1.20 (H) 12/14/2023 0911   CALCIUM 9.2 12/14/2023 0911   PROT 6.8 12/14/2023 0911   ALBUMIN 4.2 12/14/2023 0911   AST 16 12/14/2023 0911   ALT 15 12/14/2023 0911   ALKPHOS 66 12/14/2023 0911   BILITOT 0.3 12/14/2023 0911   GFRNONAA 48 (L) 12/14/2023 0911    Lab Results  Component Value Date   WBC 5.2 12/14/2023   NEUTROABS 2.9 12/14/2023   HGB 13.3 12/14/2023   HCT 40.5 12/14/2023   MCV 96.7 12/14/2023   PLT 329 12/14/2023      Chemistry      Component Value Date/Time   NA 138 12/14/2023 0911   K 3.9 12/14/2023 0911    CL 104 12/14/2023 0911   CO2 22 12/14/2023 0911   BUN 38 (H) 12/14/2023 0911   CREATININE 1.20 (H) 12/14/2023 0911      Component Value Date/Time   CALCIUM 9.2 12/14/2023 0911   ALKPHOS 66 12/14/2023 0911   AST 16 12/14/2023 0911   ALT 15 12/14/2023 0911   BILITOT 0.3 12/14/2023 0911      RADIOGRAPHIC STUDIES:  I have personally reviewed the radiological images as listed and agreed with the findings in the report.

## 2023-12-14 NOTE — Progress Notes (Signed)
 Labs reviewed with MD this am at office visit. Ok to treat and will give a bolus per orders today.

## 2023-12-14 NOTE — Progress Notes (Signed)
 Patient presents today for chemotherapy Imfinzi   infusion. Patient is in satisfactory condition with no new complaints voiced.  Vital signs are stable.  Labs reviewed by Dr. Davonna during the office visit and all labs are within treatment parameters. Patient will receive 1 Liter of NS over 2 hours.  We will proceed with treatment per MD orders.   Treatment given today per MD orders. Tolerated infusion without adverse affects. Vital signs stable. No complaints at this time. Discharged from clinic ambulatory in stable condition. Alert and oriented x 3. F/U with [facility] as scheduled.

## 2023-12-15 LAB — T4: T4, Total: 7.5 ug/dL (ref 4.5–12.0)

## 2023-12-19 ENCOUNTER — Other Ambulatory Visit: Payer: Self-pay

## 2023-12-19 ENCOUNTER — Encounter (HOSPITAL_COMMUNITY): Payer: Self-pay | Admitting: Emergency Medicine

## 2023-12-19 NOTE — Progress Notes (Signed)
 SDW CALL  Patient was given pre-op instructions over the phone. The opportunity was given for the patient to ask questions. No further questions asked. Patient verbalized understanding of instructions given.   PCP - Rocky Don at [facility] Cardiologist - Maude Emmer   PPM/ICD - denies Device Orders - n/a Rep Notified -  n/a  Chest CT - 12/16/23 EKG - 03/29/23 Stress Test - 2017 ECHO - 12/29/21 Cardiac Cath - denies  Sleep Study - denies  NO DM  Last dose of GLP1 agonist-  n/a GLP1 instructions: n/a  Blood Thinner Instructions: n/a Aspirin Instructions: n/a  ERAS Protcol - NPO PRE-SURGERY Ensure or G2- n/a  COVID TEST-  n/a   Anesthesia review:  no  Patient denies shortness of breath, fever, cough and chest pain over the phone call   All instructions explained to the patient, with a verbal understanding of the material. Patient agrees to go over the instructions while at home for a better understanding.

## 2023-12-20 ENCOUNTER — Encounter (HOSPITAL_COMMUNITY): Payer: Self-pay | Admitting: Emergency Medicine

## 2023-12-20 ENCOUNTER — Ambulatory Visit (HOSPITAL_COMMUNITY)
Admission: RE | Admit: 2023-12-20 | Discharge: 2023-12-20 | Disposition: A | Discharge status: Home / Self Care | Attending: Emergency Medicine | Admitting: Emergency Medicine

## 2023-12-20 ENCOUNTER — Ambulatory Visit (HOSPITAL_COMMUNITY): Admitting: Anesthesiology

## 2023-12-20 ENCOUNTER — Ambulatory Visit (HOSPITAL_COMMUNITY)

## 2023-12-20 ENCOUNTER — Encounter (HOSPITAL_COMMUNITY)
Admission: RE | Disposition: A | Discharge status: Home / Self Care | Payer: Self-pay | Source: Home / Self Care | Attending: Emergency Medicine

## 2023-12-20 ENCOUNTER — Other Ambulatory Visit: Payer: Self-pay

## 2023-12-20 DIAGNOSIS — L409 Psoriasis, unspecified: Secondary | ICD-10-CM | POA: Insufficient documentation

## 2023-12-20 DIAGNOSIS — R0602 Shortness of breath: Secondary | ICD-10-CM | POA: Insufficient documentation

## 2023-12-20 DIAGNOSIS — Z85118 Personal history of other malignant neoplasm of bronchus and lung: Secondary | ICD-10-CM | POA: Insufficient documentation

## 2023-12-20 DIAGNOSIS — R918 Other nonspecific abnormal finding of lung field: Secondary | ICD-10-CM | POA: Diagnosis not present

## 2023-12-20 DIAGNOSIS — A319 Mycobacterial infection, unspecified: Secondary | ICD-10-CM

## 2023-12-20 DIAGNOSIS — Z923 Personal history of irradiation: Secondary | ICD-10-CM | POA: Diagnosis not present

## 2023-12-20 DIAGNOSIS — C7802 Secondary malignant neoplasm of left lung: Secondary | ICD-10-CM | POA: Diagnosis not present

## 2023-12-20 DIAGNOSIS — J432 Centrilobular emphysema: Secondary | ICD-10-CM | POA: Diagnosis not present

## 2023-12-20 DIAGNOSIS — R911 Solitary pulmonary nodule: Secondary | ICD-10-CM

## 2023-12-20 DIAGNOSIS — R519 Headache, unspecified: Secondary | ICD-10-CM | POA: Insufficient documentation

## 2023-12-20 DIAGNOSIS — F17210 Nicotine dependence, cigarettes, uncomplicated: Secondary | ICD-10-CM

## 2023-12-20 DIAGNOSIS — Z23 Encounter for immunization: Secondary | ICD-10-CM | POA: Insufficient documentation

## 2023-12-20 DIAGNOSIS — Z79899 Other long term (current) drug therapy: Secondary | ICD-10-CM | POA: Diagnosis not present

## 2023-12-20 DIAGNOSIS — C7801 Secondary malignant neoplasm of right lung: Secondary | ICD-10-CM | POA: Diagnosis not present

## 2023-12-20 DIAGNOSIS — E785 Hyperlipidemia, unspecified: Secondary | ICD-10-CM | POA: Insufficient documentation

## 2023-12-20 DIAGNOSIS — J850 Gangrene and necrosis of lung: Secondary | ICD-10-CM | POA: Diagnosis not present

## 2023-12-20 DIAGNOSIS — J9811 Atelectasis: Secondary | ICD-10-CM | POA: Diagnosis not present

## 2023-12-20 DIAGNOSIS — Z9221 Personal history of antineoplastic chemotherapy: Secondary | ICD-10-CM | POA: Diagnosis not present

## 2023-12-20 DIAGNOSIS — Z48813 Encounter for surgical aftercare following surgery on the respiratory system: Secondary | ICD-10-CM | POA: Diagnosis not present

## 2023-12-20 DIAGNOSIS — I10 Essential (primary) hypertension: Secondary | ICD-10-CM | POA: Diagnosis not present

## 2023-12-20 DIAGNOSIS — A499 Bacterial infection, unspecified: Secondary | ICD-10-CM | POA: Diagnosis not present

## 2023-12-20 DIAGNOSIS — J984 Other disorders of lung: Secondary | ICD-10-CM | POA: Diagnosis not present

## 2023-12-20 HISTORY — PX: VIDEO BRONCHOSCOPY WITH ENDOBRONCHIAL NAVIGATION: SHX6175

## 2023-12-20 HISTORY — DX: Anemia, unspecified: D64.9

## 2023-12-20 SURGERY — VIDEO BRONCHOSCOPY WITH ENDOBRONCHIAL NAVIGATION
Anesthesia: General | Laterality: Bilateral

## 2023-12-20 MED ORDER — PROPOFOL 10 MG/ML IV BOLUS
INTRAVENOUS | Status: DC | PRN
  Administered 2023-12-20: 30 mg via INTRAVENOUS
  Administered 2023-12-20: 100 mg via INTRAVENOUS

## 2023-12-20 MED ORDER — SUGAMMADEX SODIUM 200 MG/2ML IV SOLN
INTRAVENOUS | Status: DC | PRN
Start: 2023-12-20 — End: 2023-12-20
  Administered 2023-12-20: 150 mg via INTRAVENOUS

## 2023-12-20 MED ORDER — EPHEDRINE SULFATE-NACL 50-0.9 MG/10ML-% IV SOSY
PREFILLED_SYRINGE | INTRAVENOUS | Status: DC | PRN
  Administered 2023-12-20: 10 mg via INTRAVENOUS

## 2023-12-20 MED ORDER — DEXAMETHASONE SOD PHOSPHATE PF 10 MG/ML IJ SOLN
INTRAMUSCULAR | Status: DC | PRN
  Administered 2023-12-20: 10 mg via INTRAVENOUS

## 2023-12-20 MED ORDER — ONDANSETRON HCL 4 MG/2ML IJ SOLN
INTRAMUSCULAR | Status: DC | PRN
Start: 2023-12-20 — End: 2023-12-20
  Administered 2023-12-20: 4 mg via INTRAVENOUS

## 2023-12-20 MED ORDER — ACETAMINOPHEN 10 MG/ML IV SOLN
700.0000 mg | Freq: Once | INTRAVENOUS | Status: DC | PRN
Start: 2023-12-20 — End: 2023-12-20
  Filled 2023-12-20: qty 70

## 2023-12-20 MED ORDER — LACTATED RINGERS IV SOLN: INTRAVENOUS | Status: DC

## 2023-12-20 MED ORDER — ROCURONIUM BROMIDE 10 MG/ML (PF) SYRINGE
PREFILLED_SYRINGE | INTRAVENOUS | Status: DC | PRN
  Administered 2023-12-20 (×2): 10 mg via INTRAVENOUS
  Administered 2023-12-20: 30 mg via INTRAVENOUS
  Administered 2023-12-20: 10 mg via INTRAVENOUS

## 2023-12-20 MED ORDER — FENTANYL CITRATE (PF) 100 MCG/2ML IJ SOLN
INTRAMUSCULAR | Status: DC | PRN
  Administered 2023-12-20: 100 ug via INTRAVENOUS

## 2023-12-20 MED ORDER — CHLORHEXIDINE GLUCONATE 0.12 % MT SOLN
15.0000 mL | Freq: Once | OROMUCOSAL | Status: AC
  Administered 2023-12-20: 15 mL via OROMUCOSAL
  Filled 2023-12-20: qty 15

## 2023-12-20 MED ORDER — LIDOCAINE 2% (20 MG/ML) 5 ML SYRINGE
INTRAMUSCULAR | Status: DC | PRN
  Administered 2023-12-20: 60 mg via INTRAVENOUS

## 2023-12-20 MED ORDER — FENTANYL CITRATE (PF) 100 MCG/2ML IJ SOLN: 25.0000 ug | INTRAMUSCULAR | Status: DC | PRN

## 2023-12-20 MED ORDER — PROPOFOL 500 MG/50ML IV EMUL
INTRAVENOUS | Status: DC | PRN
  Administered 2023-12-20: 75 ug/kg/min via INTRAVENOUS

## 2023-12-20 MED ORDER — FENTANYL CITRATE (PF) 100 MCG/2ML IJ SOLN
INTRAMUSCULAR | Status: AC
  Filled 2023-12-20: qty 2

## 2023-12-20 MED ORDER — AMISULPRIDE (ANTIEMETIC) 5 MG/2ML IV SOLN: 10.0000 mg | Freq: Once | INTRAVENOUS | Status: DC | PRN

## 2023-12-20 MED ORDER — PHENYLEPHRINE 80 MCG/ML (10ML) SYRINGE FOR IV PUSH (FOR BLOOD PRESSURE SUPPORT)
PREFILLED_SYRINGE | INTRAVENOUS | Status: DC | PRN
  Administered 2023-12-20: 160 ug via INTRAVENOUS
  Administered 2023-12-20: 80 ug via INTRAVENOUS
  Administered 2023-12-20 (×3): 160 ug via INTRAVENOUS

## 2023-12-20 MED ORDER — ONDANSETRON HCL 4 MG/2ML IJ SOLN: 4.0000 mg | Freq: Once | INTRAMUSCULAR | Status: DC | PRN

## 2023-12-20 SURGICAL SUPPLY — 37 items
ADAPTER BRONCHOSCOPE OLYMPUS (ADAPTER) ×1 IMPLANT
ADAPTER VALVE BIOPSY EBUS (MISCELLANEOUS) IMPLANT
BAG COUNTER SPONGE SURGICOUNT (BAG) ×1 IMPLANT
BRUSH CYTOL CELLEBRITY 1.5X140 (MISCELLANEOUS) ×1 IMPLANT
BRUSH SUPERTRAX BIOPSY (INSTRUMENTS) IMPLANT
BRUSH SUPERTRAX NDL-TIP CYTO (INSTRUMENTS) ×1 IMPLANT
CANISTER SUCTION 3000ML PPV (SUCTIONS) ×1 IMPLANT
CNTNR URN SCR LID CUP LEK RST (MISCELLANEOUS) ×1 IMPLANT
COVER BACK TABLE 60X90IN (DRAPES) ×1 IMPLANT
FILTER STRAW FLUID ASPIR (MISCELLANEOUS) IMPLANT
FORCEPS BIOP 1.5 SINGLE USE (MISCELLANEOUS) ×1 IMPLANT
FORCEPS BIOP SUPERTRX PREMAR (INSTRUMENTS) ×1 IMPLANT
GAUZE SPONGE 4X4 12PLY STRL (GAUZE/BANDAGES/DRESSINGS) ×1 IMPLANT
GLOVE BIO SURGEON STRL SZ7.5 (GLOVE) ×2 IMPLANT
GOWN STRL REUS W/ TWL LRG LVL3 (GOWN DISPOSABLE) ×2 IMPLANT
KIT CLEAN ENDO COMPLIANCE (KITS) ×1 IMPLANT
KIT LOCATABLE GUIDE (CANNULA) IMPLANT
KIT MARKER FIDUCIAL DELIVERY (KITS) IMPLANT
KIT TURNOVER KIT B (KITS) ×1 IMPLANT
MARKER SKIN DUAL TIP RULER LAB (MISCELLANEOUS) ×1 IMPLANT
NDL SUPERTRX PREMARK BIOPSY (NEEDLE) ×1 IMPLANT
NEEDLE SUPERTRX PREMARK BIOPSY (NEEDLE) ×1 IMPLANT
OIL SILICONE PENTAX (PARTS (SERVICE/REPAIRS)) ×1 IMPLANT
PAD ARMBOARD POSITIONER FOAM (MISCELLANEOUS) ×2 IMPLANT
PATCHES PATIENT (LABEL) ×3 IMPLANT
SOLN 0.9% NACL POUR BTL 1000ML (IV SOLUTION) ×1 IMPLANT
SOLN STERILE WATER BTL 1000 ML (IV SOLUTION) ×1 IMPLANT
SYR 20ML ECCENTRIC (SYRINGE) ×1 IMPLANT
SYR 20ML LL LF (SYRINGE) ×1 IMPLANT
SYR 50ML SLIP (SYRINGE) ×1 IMPLANT
TOWEL GREEN STERILE FF (TOWEL DISPOSABLE) ×1 IMPLANT
TRAP SPECIMEN MUCUS 40CC (MISCELLANEOUS) IMPLANT
TUBE CONNECTING 20X1/4 (TUBING) ×1 IMPLANT
UNDERPAD 30X36 HEAVY ABSORB (UNDERPADS AND DIAPERS) ×1 IMPLANT
VALVE BIOPSY SINGLE USE (MISCELLANEOUS) ×1 IMPLANT
VALVE SUCTION BRONCHIO DISP (MISCELLANEOUS) ×1 IMPLANT
superlock fiducial marker IMPLANT

## 2023-12-20 NOTE — Interval H&P Note (Signed)
 History and Physical Interval Note:  12/20/2023 7:35 AM  Virginia Powell  has presented today for surgery, with the diagnosis of Bilateral pulmonary nodules.  The various methods of treatment have been discussed with the patient and family. After consideration of risks, benefits and other options for treatment, the patient has consented to  Procedure(s): VIDEO BRONCHOSCOPY WITH ENDOBRONCHIAL NAVIGATION (Bilateral) as a surgical intervention.  The patient's history has been reviewed, patient examined, no change in status, stable for surgery.  I have reviewed the patient's chart and labs.  Questions were answered to the patient's satisfaction.     Lamar GORMAN Chris

## 2023-12-20 NOTE — Progress Notes (Signed)
 Dr. Shelah is good with chest xray.

## 2023-12-20 NOTE — Anesthesia Procedure Notes (Signed)
 Procedure Name: Intubation Date/Time: 12/20/2023 7:56 AM  Performed by: Sharon Stapel J, CRNAPre-anesthesia Checklist: Patient identified, Emergency Drugs available, Suction available and Patient being monitored Patient Re-evaluated:Patient Re-evaluated prior to induction Oxygen Delivery Method: Circle System Utilized Preoxygenation: Pre-oxygenation with 100% oxygen Induction Type: IV induction Ventilation: Mask ventilation without difficulty Laryngoscope Size: Miller and 2 Grade View: Grade I Tube type: Oral Tube size: 8.5 mm Number of attempts: 1 Airway Equipment and Method: Stylet and Oral airway Placement Confirmation: ETT inserted through vocal cords under direct vision, positive ETCO2 and breath sounds checked- equal and bilateral Secured at: 21 cm Tube secured with: Tape Dental Injury: Teeth and Oropharynx as per pre-operative assessment

## 2023-12-20 NOTE — Op Note (Signed)
 Procedure Note  Patient: Virginia Powell  [employer] Cios mobile C-arm was utilized to identify and biopsy left lower lobe superior segment nodule and new right upper lobe pulmonary nodule.  Needle-in-lesion was confirmed using real-time Cios imaging, and images were uploaded to PACS.    Left lower lobe superior segment nodule   Right upper lobe pulmonary nodule  Lamar Chris, MD, PhD 12/20/2023, 9:27 AM [facility] [phone] or if no answer before 7:00PM call [phone] For any issues after 7:00PM please call eLink [phone]

## 2023-12-20 NOTE — Discharge Instructions (Addendum)

## 2023-12-20 NOTE — Anesthesia Preprocedure Evaluation (Addendum)
 Anesthesia Evaluation  Patient identified by MRN, date of birth, ID band Patient awake    Reviewed: Allergy & Precautions, NPO status , Patient's Chart, lab work & pertinent test results  History of Anesthesia Complications (+) PONV and history of anesthetic complications  Airway Mallampati: II  TM Distance: >3 FB Neck ROM: Full    Dental no notable dental hx.    Pulmonary COPD,  COPD inhaler, Current Smoker and Patient abstained from smoking.   Pulmonary exam normal        Cardiovascular hypertension, Pt. on medications Normal cardiovascular exam     Neuro/Psych  Headaches    GI/Hepatic negative GI ROS, Neg liver ROS,,,  Endo/Other  negative endocrine ROS    Renal/GU Renal InsufficiencyRenal disease     Musculoskeletal negative musculoskeletal ROS (+)    Abdominal   Peds  Hematology negative hematology ROS (+)   Anesthesia Other Findings Bilateral pulmonary nodules  Reproductive/Obstetrics                              Anesthesia Physical Anesthesia Plan  ASA: 3  Anesthesia Plan: General   Post-op Pain Management:    Induction: Intravenous  PONV Risk Score and Plan: 3 and Ondansetron , Dexamethasone , Propofol  infusion and Treatment may vary due to age or medical condition  Airway Management Planned: Oral ETT  Additional Equipment:   Intra-op Plan:   Post-operative Plan: Extubation in OR  Informed Consent: I have reviewed the patients History and Physical, chart, labs and discussed the procedure including the risks, benefits and alternatives for the proposed anesthesia with the patient or authorized representative who has indicated his/her understanding and acceptance.     Dental advisory given  Plan Discussed with: CRNA  Anesthesia Plan Comments:          Anesthesia Quick Evaluation

## 2023-12-20 NOTE — Op Note (Signed)
 Video Bronchoscopy with Robotic Assisted Bronchoscopic Navigation   Date of Operation: 12/20/2023   Pre-op Diagnosis: Bilateral pulmonary nodules  Post-op Diagnosis: Same  Surgeon: Lamar Chris  Assistants: None  Anesthesia: General endotracheal anesthesia  Operation: Flexible video fiberoptic bronchoscopy with robotic assistance and biopsies.  Estimated Blood Loss: Minimal  Complications: None  Indications and History: Virginia Powell is a 73 y.o. female with history of tobacco use and COPD.  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  She has had subsequent bronchoscopy to evaluate pulmonary nodules that has shown atypical cells, not definitively small cell lung cancer.  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  She now has enlargement of a previously biopsied left lower lobe superior segmental nodule and a new right upper lobe nodule on her surveillance imaging.  Recommendation made to achieve a tissue diagnosis and culture data via robotic assisted navigational bronchoscopy.  The risks, benefits, complications, treatment options and expected outcomes were discussed with the patient.  The possibilities of pneumothorax, pneumonia, reaction to medication, pulmonary aspiration, perforation of a viscus, bleeding, failure to diagnose a condition and creating a complication requiring transfusion or operation were discussed with the patient who freely signed the consent.    Description of Procedure: The patient was seen in the Preoperative Area, was examined and was deemed appropriate to proceed.  The patient was taken to [facility] Endoscopy room 3, identified as Virginia Powell and the procedure verified as Flexible Video Fiberoptic Bronchoscopy.  A Time Out was held and the above information confirmed.   Prior to the date of the procedure a high-resolution CT scan of the chest was performed. Utilizing ION  software program a virtual tracheobronchial tree was generated to allow the creation of distinct navigation pathways to the patient's parenchymal abnormalities. After being taken to the operating room general anesthesia was initiated and the patient  was orally intubated. The video fiberoptic bronchoscope was introduced via the endotracheal tube and a general inspection was performed which showed normal right and left lung anatomy. Aspiration of the bilateral mainstems was completed to remove any remaining secretions. Robotic catheter inserted into patient's endotracheal tube.   Target #1 left lower lobe superior segment nodule: The distinct navigation pathways prepared prior to this procedure were then utilized to navigate to patient's lesion identified on CT scan. The robotic catheter was secured into place and the vision probe was withdrawn.  Lesion location was approximated using fluoroscopy.  Local registration and targeting was performed using [employer] Cios mobile C-arm three-dimensional imaging. Under fluoroscopic guidance transbronchial needle biopsies and transbronchial forceps biopsies were performed to be sent for cytology and pathology.  Needle-in-lesion was confirmed using Cios mobile C-arm.  A single needle sample from this lesion was placed into sterile saline and sent for microbiology including AFB cultures.  Target #2 new right upper lobe pulmonary nodule: The distinct navigation pathways prepared prior to this procedure were then utilized to navigate to patient's lesion identified on CT scan. The robotic catheter was secured into place and the vision probe was withdrawn.  Lesion location was approximated using fluoroscopy.  Local registration and targeting was performed using [employer] Cios mobile C-arm three-dimensional imaging. Under fluoroscopic guidance transbronchial needle brushings, transbronchial needle biopsies, and transbronchial forceps biopsies were  performed to be sent for cytology and pathology.  Needle-in-lesion was confirmed using Cios mobile C-arm.  A bronchioalveolar lavage was performed in the right upper  lobe adjacent to the nodule and sent for cytology and microbiology.  Under fluoroscopic guidance a single fiducial marker was placed adjacent to the nodule.  At the end of the procedure a general airway inspection was performed and there was no evidence of active bleeding. The bronchoscope was removed.  The patient tolerated the procedure well. There was no significant blood loss and there were no obvious complications. A post-procedural chest x-ray is pending.  Samples Target #1: 1. Transbronchial Wang needle biopsies from left lower lobe superior segmental nodule 2. Transbronchial forceps biopsies from left lower lobe superior segmental nodule  Samples Target #2: 1. Transbronchial needle brushings from right upper lobe nodule 2. Transbronchial Wang needle biopsies from right upper lobe nodule 3. Transbronchial forceps biopsies from right upper lobe nodule 4. Bronchoalveolar lavage from right upper lobe  Plans:  The patient will be discharged from the PACU to home when recovered from anesthesia and after chest x-ray is reviewed. We will review the cytology, pathology and microbiology results with the patient when they become available. Outpatient followup will be with CANDIE Lites, NP, Dr Shelah and Dr Davonna.    Lamar Shelah, MD, PhD 12/20/2023, 9:02 AM [facility] [phone] or if no answer before 7:00PM call [phone] For any issues after 7:00PM please call eLink [phone]

## 2023-12-20 NOTE — Transfer of Care (Signed)
 Immediate Anesthesia Transfer of Care Note  Patient: Virginia Powell  Procedure(s) Performed: VIDEO BRONCHOSCOPY WITH ENDOBRONCHIAL NAVIGATION (Bilateral)  Patient Location: PACU  Anesthesia Type:General  Level of Consciousness: awake, alert , and oriented  Airway & Oxygen Therapy: Patient Spontanous Breathing and Patient connected to face mask oxygen  Post-op Assessment: Report given to RN and Post -op Vital signs reviewed and stable  Post vital signs: Reviewed and stable  Last Vitals:  Vitals Value Taken Time  BP 106/64 12/20/23 09:06  Temp 36.7 C 12/20/23 09:07  Pulse 107 12/20/23 09:09  Resp 19 12/20/23 09:09  SpO2 96 % 12/20/23 09:09  Vitals shown include unfiled device data.  Last Pain:  Vitals:   12/20/23 0716  TempSrc:   PainSc: 0-No pain         Complications: No notable events documented.

## 2023-12-21 LAB — ACID FAST SMEAR (AFB, MYCOBACTERIA)
Acid Fast Smear: NEGATIVE
Acid Fast Smear: NEGATIVE

## 2023-12-21 NOTE — Anesthesia Postprocedure Evaluation (Signed)
 Anesthesia Post Note  Patient: Virginia Powell  Procedure(s) Performed: VIDEO BRONCHOSCOPY WITH ENDOBRONCHIAL NAVIGATION (Bilateral)     Patient location during evaluation: PACU Anesthesia Type: General Level of consciousness: awake Pain management: pain level controlled Vital Signs Assessment: post-procedure vital signs reviewed and stable Respiratory status: spontaneous breathing, nonlabored ventilation and respiratory function stable Cardiovascular status: blood pressure returned to baseline and stable Postop Assessment: no apparent nausea or vomiting Anesthetic complications: no   No notable events documented.  Last Vitals:  Vitals:   12/20/23 0930 12/20/23 0937  BP: 116/73 108/72  Pulse: (!) 107 (!) 102  Resp: 14 19  Temp:  36.6 C  SpO2: 91% 96%    Last Pain:  Vitals:   12/20/23 0930  TempSrc:   PainSc: 0-No pain                 Alwin Lanigan P Kedron Uno

## 2023-12-22 LAB — CULTURE, BAL-QUANTITATIVE W GRAM STAIN
Culture: NO GROWTH
Gram Stain: NONE SEEN

## 2023-12-22 LAB — CYTOLOGY - NON PAP

## 2023-12-23 LAB — FUNGAL STAIN REFLEX

## 2023-12-23 LAB — FUNGUS STAIN

## 2023-12-25 LAB — AEROBIC/ANAEROBIC CULTURE W GRAM STAIN (SURGICAL/DEEP WOUND)
Culture: NO GROWTH
Culture: NO GROWTH
Gram Stain: NONE SEEN
Gram Stain: NONE SEEN

## 2023-12-27 ENCOUNTER — Encounter: Payer: Self-pay | Admitting: Acute Care

## 2023-12-27 ENCOUNTER — Telehealth: Payer: Self-pay | Admitting: Acute Care

## 2023-12-27 ENCOUNTER — Ambulatory Visit: Admitting: Acute Care

## 2023-12-27 VITALS — BP 104/76 | HR 105 | Temp 98.7°F | Ht 63.0 in | Wt 100.2 lb

## 2023-12-27 DIAGNOSIS — J449 Chronic obstructive pulmonary disease, unspecified: Secondary | ICD-10-CM

## 2023-12-27 DIAGNOSIS — Z23 Encounter for immunization: Secondary | ICD-10-CM

## 2023-12-27 DIAGNOSIS — F17210 Nicotine dependence, cigarettes, uncomplicated: Secondary | ICD-10-CM | POA: Diagnosis not present

## 2023-12-27 DIAGNOSIS — Z9889 Other specified postprocedural states: Secondary | ICD-10-CM | POA: Diagnosis not present

## 2023-12-27 DIAGNOSIS — R918 Other nonspecific abnormal finding of lung field: Secondary | ICD-10-CM

## 2023-12-27 DIAGNOSIS — R9389 Abnormal findings on diagnostic imaging of other specified body structures: Secondary | ICD-10-CM

## 2023-12-27 DIAGNOSIS — R911 Solitary pulmonary nodule: Secondary | ICD-10-CM

## 2023-12-27 DIAGNOSIS — F17200 Nicotine dependence, unspecified, uncomplicated: Secondary | ICD-10-CM

## 2023-12-27 NOTE — Telephone Encounter (Signed)
 Can we make sure she has a 3 month follow up visit after the scan? Thanks so much

## 2023-12-27 NOTE — Patient Instructions (Addendum)
 It is good to see you today.  I am glad you have done well after the biopsy procedure. We have reviewed the biopsy results. They were negative for malignancy, which is reassuring. We will do a 3 month follow up CT chest.  This will be due early February 2026. You will get a call to get this scheduled.  Call if you have any blood in your sputum, or weight loss that you cannot explain so we can get you in to be seen sooner.  Continue Trelegy 1 puff once daily. Rinse mouth after use.  Use albuterol as needed for shortness of breath or wheezing. Call if you need us  sooner.  Please contact office for sooner follow up if symptoms do not improve or worsen or seek emergency care   You can receive free nicotine replacement therapy (patches, gum, or mints) by calling 1-800-QUIT NOW. Please call so we can get you on the path to becoming a non-smoker. I know it is hard, but you can do this!  Hypnosis for smoking cessation  [employer] [phone]  Acupuncture for smoking cessation  [employer] [phone]

## 2023-12-27 NOTE — Progress Notes (Signed)
 History of Present Illness Virginia Powell is a 73 y.o. female current some  day smoker  (90 pack year smoking history)  with a history of COPD, small cell lung cancer diagnosed 09/2022, treated with chemoradiation , hypertension, hyperlipidemia, psoriasis, seasonal allergies. She has been followed by Dr. Darlean for her COPD. She participates in lung cancer screening program, followed for abnormal chest imaging.   Synopsis 41 with history of tobacco use and associated COPD, small cell lung cancer that was diagnosed in 09/2022 that was treated with chemoradiation.This was diagnosed on her first bronchoscopy with biopsies.    A repeat bronchoscopy was done in 03/29/2023 due to concern about possible recurrence. Atypical cells were seen in the right lower lobe, left lower lobe superior segment and the right upper lobe. Cultures showed Mycobacterium abscessus and Pseudomonas. She has been seen by ID and is on observation, has not had dedicated treatment for this. Currently on maintenance Durvalumab .   She had a repeat CT scan of the chest, abdomen, pelvis on 11/07/2023  Which shows no hilar or mediastinal adenopathy, severe emphysema, multiple spiculated bilateral pulmonary nodules some of which are decreased in size including inferior right upper lobe, medial right lower lobe.  There are other nodules that are increased in size including a superior segment left lower lobe nodule now 1.1 x 1.0 cm.  Is also a new nodule in the posterior right upper lobe 9 x 6 mm.   Interpretation of her CT scan of the chest, abdomen, pelvis complicated given her history of small cell lung cancer but also untreated Mycobacterium avium.  Her transbronchial biopsies on her most recent bronchoscopy in February 2025  did show atypical cells and multiple nodules.  These have decreased in size on her current Durvalumab .  That said there is enlargement of a nodule in the superior segment on the left and a new nodule in the right  upper lobe.  Dr. Shelah discussed with her the possibility that the nodules are due to her mycobacterial disease and that the options include empiric treatment with antibiotic therapy to see if these decrease in size.  Clearly there would be value in obtaining a definitive diagnosis since she would probably have a change in her cancer therapy if these enlarging nodules represented varied response to her Durvalumab .  Based on this she decided to repeat her bronchoscopy to characterize the changing nodules definitively.  She is here today to review the results of her Biopsies, and to ensure she has recovered well from the procedure.  12/27/2023 Discussed the use of AI scribe software for clinical note transcription with the patient, who gave verbal consent to proceed.  History of Present Illness Virginia Powell is a 73 year old female with lung disease who presents for follow-up after her third lung biopsy.  She has a history of lung disease related to mycobacterium, which has not been treated due to insufficient symptoms. She notes a slight worsening of shortness of breath from her baseline, which has since improved. She experiences a chronic cough, sometimes producing clear or yellow sputum. No fevers or significant weight loss recently, although she did lose weight during a bout of pneumonia in May but has since regained it.  She recently underwent her third lung biopsy, which she tolerated well with no significant bleeding, only occasional blood-tinged sputum. We reviewed the results together, which were showed atypical cells in the left lower lobe, the Right Upper Lobe was negative for malignancy . I will do  a 3 month follow up scan to ensure stability of the nodules. I will let Dr. Shelah know the result of the biopsies to see if he has any additional suggestions other than surveillance.   She has a history of cancer diagnosed in August 2024, initially treated with chemotherapy, which was later  changed to immunotherapy in January due to adverse effects. Recent imaging showed a reduction in cancer size. She is currently under the care of an oncologist.  She reports a dental issue with swelling and pain in a tooth, for which she self-administered clindamycin, leading to reduced swelling. She plans to contact her dentist for further evaluation.  Her current medications include Trelegy, which she uses daily, and albuterol for shortness of breath, which she uses as needed. She has been using albuterol more frequently this week but sometimes goes several days without needing it. Cooler air helps her breathing.  She has a history of smoking and is currently trying to reduce her cigarette use. She has nicotine patches at home but prefers not to use them, opting instead for mints to manage cravings.     Test Results: Cytology 12/20/2023 A. LUNG, LEFT LOWER SUPERIOR SEGMENT, TARGET#1, NEEDLE ASPIRATION  BIOPSY:  - Atypical cells present, favor reactive.  - Necrotic material with rare AFB microorganism; correlation with microbiology cultures is required.  - GMS stain is negative for yeast / fungus.   C. LUNG, RUL TARGET#2, NEEDLE ASPIRATION  BIOPSIES:  - Negative for malignancy.  - Fragment of alveolated lung with reactive changes and intra-alveolar  macrophages.   D. LUNG, RUL TARGET#2, BRUSHING:  - Negative for malignancy.  - Predominantly blood.     Latest Ref Rng & Units 12/14/2023    9:11 AM 11/16/2023    8:52 AM 10/19/2023    9:03 AM  CBC  WBC 4.0 - 10.5 K/uL 5.2  6.4  5.8   Hemoglobin 12.0 - 15.0 g/dL 86.6  87.2  87.6   Hematocrit 36.0 - 46.0 % 40.5  39.3  37.4   Platelets 150 - 400 K/uL 329  354  324        Latest Ref Rng & Units 12/14/2023    9:11 AM 11/16/2023    8:52 AM 10/19/2023    9:03 AM  BMP  Glucose 70 - 99 mg/dL 871  95  88   BUN 8 - 23 mg/dL 38  24  34   Creatinine 0.44 - 1.00 mg/dL 8.79  8.97  9.05   Sodium 135 - 145 mmol/L 138  140  138   Potassium 3.5 -  5.1 mmol/L 3.9  4.3  3.9   Chloride 98 - 111 mmol/L 104  107  105   CO2 22 - 32 mmol/L 22  21  22    Calcium 8.9 - 10.3 mg/dL 9.2  9.3  9.1     BNP No results found for: BNP  ProBNP No results found for: PROBNP  PFT    Component Value Date/Time   FEV1PRE 1.22 03/09/2022 1445   FEV1POST 1.20 03/09/2022 1445   FVCPRE 2.35 03/09/2022 1445   FVCPOST 2.28 03/09/2022 1445   TLC 5.63 03/09/2022 1445   DLCOUNC 10.30 03/09/2022 1445   PREFEV1FVCRT 52 03/09/2022 1445   PSTFEV1FVCRT 53 03/09/2022 1445    DG Chest Port 1 View Result Date: 12/20/2023 EXAM: 1 VIEW(S) XRAY OF THE CHEST 12/20/2023 09:27:00 AM COMPARISON: 03/29/2023 CLINICAL HISTORY: S/P bronchoscopy with biopsy FINDINGS: LINES, TUBES AND DEVICES: Right chest wall Port-A-Cath in place  with tip overlying the right atrium. Bilateral fiducial markers, a new 1 in the right upper lung. LUNGS AND PLEURA: Known right upper lobe pulmonary nodule. Right base atelectasis. Chronic coarsened interstitial markings. No pulmonary edema. No pleural effusion. No pneumothorax. HEART AND MEDIASTINUM: Tip of the Port-A-Cath overlies the right atrium. Atherosclerotic plaque. BONES AND SOFT TISSUES: No acute osseous abnormality. IMPRESSION: 1. No acute cardiopulmonary process identified. 2. New right upper lung fiducial marker noted. Electronically signed by: Katheleen Faes MD 12/20/2023 10:01 AM EST RP Workstation: [id number]   DG C-ARM BRONCHOSCOPY Result Date: 12/20/2023 C-ARM BRONCHOSCOPY: Fluoroscopy was utilized by the requesting physician.  No radiographic interpretation.   CT SUPER D CHEST WO CONTRAST Result Date: 12/16/2023 CLINICAL DATA:  Follow-up lung nodules, history of lung cancer * Tracking Code: BO * EXAM: CT CHEST WITHOUT CONTRAST TECHNIQUE: Multidetector CT imaging of the chest was performed using thin slice collimation for electromagnetic bronchoscopy planning purposes, without intravenous contrast. RADIATION DOSE REDUCTION: This exam  was performed according to the departmental dose-optimization program which includes automated exposure control, adjustment of the mA and/or kV according to patient size and/or use of iterative reconstruction technique. COMPARISON:  11/07/2023 FINDINGS: Cardiovascular: Aortic atherosclerosis. Right chest port catheter. Normal heart size. Right coronary artery calcifications. No pericardial effusion. Mediastinum/Nodes: No enlarged mediastinal, hilar, or axillary lymph nodes. Thyroid  gland, trachea, and esophagus demonstrate no significant findings. Lungs/Pleura: Severe emphysema. Multiple bilateral spiculated pulmonary nodules, including a nodule in the right upper lobe containing a biopsy marking clip measuring 1.4 x 0.9 cm (series 8, image 53), a nodule in the medial infrahilar right lower lobe measuring 2.0 x 1.4 cm containing a biopsy marking clip (series 8, image 69) a nodule containing a biopsy marking clip in the superior segment left lower lobe measuring 1.0 x 1.0 cm (series 8, image 48) and a nodule in the posterior right upper lobe measuring 0.8 x 0.6 cm, which was new at the time of examination dated 11/07/2023 (series 8, image 41). Additional smaller bilateral pulmonary nodules are likewise unchanged. Unchanged appearance of bandlike scarring and ground-glass in the perihilar right lung. No pleural effusion or pneumothorax. Upper Abdomen: No acute abnormality. Unchanged benign bilateral macroscopic fat containing adrenal adenomata. Musculoskeletal: No chest wall abnormality. No acute osseous findings. IMPRESSION: 1. Multiple bilateral spiculated pulmonary nodules, including a nodule in the right upper lobe containing a biopsy marking clip measuring 1.4 x 0.9 cm, a nodule in the medial infrahilar right lower lobe measuring 2.0 x 1.4 cm containing a biopsy marking clip, and a nodule in the posterior right upper lobe measuring 0.8 x 0.6 cm, which was new at the time of examination dated 11/07/2023.  Additional smaller bilateral pulmonary nodules are likewise unchanged. 2. Unchanged appearance of bandlike scarring and ground-glass in the perihilar right lung. 3. Severe emphysema. 4. Coronary artery disease. 5. Unchanged benign bilateral macroscopic fat containing adrenal adenomata. Aortic Atherosclerosis (ICD10-I70.0) and Emphysema (ICD10-J43.9). Electronically Signed   By: Marolyn JONETTA Jaksch M.D.   On: 12/16/2023 11:32     Past medical hx Past Medical History:  Diagnosis Date   Anemia    Complication of anesthesia    COPD (chronic obstructive pulmonary disease) (HCC)    Dyspnea    Essential hypertension    Headache    Hyperlipidemia    Hypertension    Migraines    Pneumonia 04/2018   PONV (postoperative nausea and vomiting)    with hysterectomy   Psoriasis    Seasonal allergies    Smoker  Social History   Tobacco Use   Smoking status: Some Days    Current packs/day: 1.62    Average packs/day: 1.6 packs/day for 55.0 years (89.1 ttl pk-yrs)    Types: Cigarettes   Smokeless tobacco: Never   Tobacco comments:    Smoking 1-2 puffs. Trying to quit 12/07/2023        5 or less a day 11/1/20205   Vaping Use   Vaping status: Never Used  Substance Use Topics   Alcohol  use: Not Currently    Comment: social   Drug use: Never    Ms.Louthan reports that she has been smoking cigarettes. She has a 89.1 pack-year smoking history. She has never used smokeless tobacco. She reports that she does not currently use alcohol . She reports that she does not use drugs.  Tobacco Cessation: Ready to quit: Not Answered Counseling given: Not Answered Tobacco comments: Smoking 1-2 puffs. Trying to quit 12/07/2023  5 or less a day 11/1/20205  Current smoker   Past surgical hx, Family hx, Social hx all reviewed.  Current Outpatient Medications on File Prior to Visit  Medication Sig   albuterol (PROVENTIL) (2.5 MG/3ML) 0.083% nebulizer solution SMARTSIG:1 Vial(s) Via Nebulizer Every 4-6  Hours PRN   albuterol (VENTOLIN HFA) 108 (90 Base) MCG/ACT inhaler SMARTSIG:2 Puff(s) By Mouth Every 4 Hours   atorvastatin (LIPITOR) 40 MG tablet Take 40 mg by mouth daily.   benzonatate (TESSALON) 100 MG capsule Take 100 mg by mouth 3 (three) times daily as needed for cough.   clindamycin (CLEOCIN) 300 MG capsule Take 300 mg by mouth 3 (three) times daily.   ferrous sulfate 324 MG TBEC Take 324 mg by mouth.   fexofenadine (ALLEGRA) 180 MG tablet Take 180 mg by mouth daily as needed for allergies or rhinitis.   fluticasone (FLONASE) 50 MCG/ACT nasal spray Place into both nostrils daily as needed.   hydrochlorothiazide (HYDRODIURIL) 12.5 MG tablet Take 12.5 mg by mouth daily.   hydrOXYzine (ATARAX) 25 MG tablet Take 1 tablet by mouth at bedtime as needed.   lidocaine -prilocaine  (EMLA ) cream Apply a quarter-sized amount to port a cath site and cover with plastic wrap 1 hour prior to infusion appointments   loratadine (CLARITIN) 10 MG tablet Take 10 mg by mouth daily as needed for allergies.   olmesartan  (BENICAR ) 40 MG tablet Take 1 tablet (40 mg total) by mouth daily.   ondansetron  (ZOFRAN ) 8 MG tablet Take 1 tablet (8 mg total) by mouth every 8 (eight) hours as needed for nausea or vomiting.   prochlorperazine  (COMPAZINE ) 10 MG tablet Take 1 tablet (10 mg total) by mouth every 6 (six) hours as needed for nausea or vomiting.   rizatriptan (MAXALT-MLT) 10 MG disintegrating tablet Take 10 mg by mouth as needed for migraine. May repeat in 2 hours if needed   sucralfate  (CARAFATE ) 1 GM/10ML suspension Take 10 mLs (1 g total) by mouth 4 (four) times daily -  with meals and at bedtime.   TRELEGY ELLIPTA 100-62.5-25 MCG/ACT AEPB Inhale 1 puff into the lungs daily.   No current facility-administered medications on file prior to visit.     Allergies  Allergen Reactions   Morphine And Codeine Nausea And Vomiting    Review Of Systems:  Constitutional:   No  weight loss, night sweats,  Fevers,  chills, fatigue, or  lassitude.  HEENT:   No headaches,  Difficulty swallowing,  Tooth/dental problems, or  Sore throat,  No sneezing, itching, ear ache, nasal congestion, post nasal drip,   CV:  No chest pain,  Orthopnea, PND, swelling in lower extremities, anasarca, dizziness, palpitations, syncope.   GI  No heartburn, indigestion, abdominal pain, nausea, vomiting, diarrhea, change in bowel habits, loss of appetite, bloody stools.   Resp: + shortness of breath with exertion none  at rest.  + baseline  excess mucus, + productive cough,  No non-productive cough,  No coughing up of blood.  No change in color of mucus.  + occasional wheezing.  No chest wall deformity  Skin: no rash or lesions.  GU: no dysuria, change in color of urine, no urgency or frequency.  No flank pain, no hematuria   MS:  No joint pain or swelling.  No decreased range of motion.  No back pain.  Psych:  No change in mood or affect. No depression or anxiety.  No memory loss.   Vital Signs BP 104/76   Pulse (!) 105   Temp 98.7 F (37.1 C) (Temporal)   Ht 5' 3 (1.6 m)   Wt 100 lb 3.2 oz (45.5 kg)   SpO2 98%   BMI 17.75 kg/m     Physical Exam MEASUREMENTS: Weight- 100. GENERAL: No distress, alert and oriented times 3. EARS NOSE THROAT: No sinus tenderness, tympanic membranes clear, pale nasal mucosa, no oral exudate, no post nasal drip, no lymphadenopathy. CHEST: Lungs clear to auscultation bilaterally. No wheeze, rales, dullness, no accessory muscle use, no nasal flaring, no sternal retractions. CARDIAC: S1, S2, regular rate and rhythm, no murmur. ABDOMINAL: Soft, non tender. ND, BS present, EXTREMITIES: No clubbing, cyanosis, edema. No obvious deformities. NEUROLOGICAL: Normal strength. Alert and oriented x 3, MAE x 4. SKIN: No rashes, warm and dry. No obvious skin lesions. PSYCHIATRIC: Normal mood and behavior.   Assessment/Plan  Assessment and Plan Assessment & Plan Pulmonary nodule  with atypical/reactive cells under surveillance Pulmonary nodule with atypical cells, likely reactive.  No malignancy in recent biopsies.  Cultures negative for infections to date>> Continued surveillance necessary. - Ordered follow-up CT chest in three months, around February 2026. - Instructed to call if experiencing hemoptysis or unexplained weight loss.  Lung cancer currently managed with immunotherapy Recent CT shows shrinkage, but one new nodule.  Biopsies negative for malignancy, atypical cells likely reactive.  Oncologist  managing treatment. - Continue current immunotherapy regimen. - Coordinated with oncologist for ongoing management.  Chronic obstructive pulmonary disease (COPD) COPD managed with Trelegy and albuterol.  Increased albuterol use reported.  Cold weather may improve symptoms.  Continues to smoke, though reduced. - Continue Trelegy daily, ensure mouth rinsing after use. - Use albuterol as needed for dyspnea or wheezing. - Encouraged smoking cessation with resources for nicotine replacement therapy.  Chronic mycobacterial lung infection present, not treated due to lack of symptoms.  Follow-up with infectious disease scheduled. - Continue monitoring symptoms and follow up with infectious disease in January 2025 .  Current tobacco use Continues to smoke, though reduced.  Discussed smoking cessation options. - Provided information on free nicotine patches, gum, or mints. - Encouraged use of nicotine mints for cravings. - Discussed hypnosis as a smoking cessation option.  Weight is 100 lbs, BMI 17.75 Encouraged to eat 3 meals per day Consider Boost or Ensure supplements  AVS 12/27/2023 I am glad you have done well after the biopsy procedure. We have reviewed the biopsy results. They were negative for malignancy, which is reassuring. We will do a 3 month follow up CT  chest.  This will be due early February 2026. You will get a call to get this scheduled.   Call if you have any blood in your sputum, or weight loss that you cannot explain so we can get you in to be seen sooner.  Continue Trelegy 1 puff once daily. Rinse mouth after use.  Use albuterol as needed for shortness of breath or wheezing. Call if you need us  sooner.  Please contact office for sooner follow up if symptoms do not improve or worsen or seek emergency care   You can receive free nicotine replacement therapy (patches, gum, or mints) by calling 1-800-QUIT NOW. Please call so we can get you on the path to becoming a non-smoker. I know it is hard, but you can do this!  Hypnosis for smoking cessation  [employer] [phone]  Acupuncture for smoking cessation  [employer] [phone]    I spent 25 minutes dedicated to the care of this patient on the date of this encounter to include pre-visit review of records, face-to-face time with the patient discussing conditions above, post visit ordering of testing, clinical documentation with the electronic health record, making appropriate referrals as documented, and communicating necessary information to the patient's healthcare team.    Lauraine JULIANNA Lites, NP 12/27/2023  11:04 AM

## 2023-12-28 NOTE — Telephone Encounter (Signed)
 noted

## 2024-01-01 ENCOUNTER — Other Ambulatory Visit: Payer: Self-pay

## 2024-01-11 ENCOUNTER — Inpatient Hospital Stay: Attending: Oncology

## 2024-01-11 ENCOUNTER — Inpatient Hospital Stay: Admitting: Oncology

## 2024-01-11 ENCOUNTER — Inpatient Hospital Stay

## 2024-01-11 VITALS — BP 139/84 | HR 89 | Temp 99.2°F | Resp 18 | Ht 62.0 in | Wt 100.0 lb

## 2024-01-11 VITALS — BP 107/70 | HR 98 | Resp 18

## 2024-01-11 DIAGNOSIS — Z720 Tobacco use: Secondary | ICD-10-CM | POA: Diagnosis not present

## 2024-01-11 DIAGNOSIS — C7801 Secondary malignant neoplasm of right lung: Secondary | ICD-10-CM | POA: Diagnosis not present

## 2024-01-11 DIAGNOSIS — A319 Mycobacterial infection, unspecified: Secondary | ICD-10-CM | POA: Insufficient documentation

## 2024-01-11 DIAGNOSIS — R059 Cough, unspecified: Secondary | ICD-10-CM | POA: Insufficient documentation

## 2024-01-11 DIAGNOSIS — C3490 Malignant neoplasm of unspecified part of unspecified bronchus or lung: Secondary | ICD-10-CM | POA: Insufficient documentation

## 2024-01-11 DIAGNOSIS — R0602 Shortness of breath: Secondary | ICD-10-CM | POA: Insufficient documentation

## 2024-01-11 DIAGNOSIS — C7802 Secondary malignant neoplasm of left lung: Secondary | ICD-10-CM | POA: Diagnosis not present

## 2024-01-11 DIAGNOSIS — Z5112 Encounter for antineoplastic immunotherapy: Secondary | ICD-10-CM | POA: Diagnosis not present

## 2024-01-11 DIAGNOSIS — F17210 Nicotine dependence, cigarettes, uncomplicated: Secondary | ICD-10-CM | POA: Insufficient documentation

## 2024-01-11 LAB — COMPREHENSIVE METABOLIC PANEL WITH GFR
ALT: 25 U/L (ref 0–44)
AST: 22 U/L (ref 15–41)
Albumin: 4.1 g/dL (ref 3.5–5.0)
Alkaline Phosphatase: 57 U/L (ref 38–126)
Anion gap: 11 (ref 5–15)
BUN: 24 mg/dL — ABNORMAL HIGH (ref 8–23)
CO2: 23 mmol/L (ref 22–32)
Calcium: 9.1 mg/dL (ref 8.9–10.3)
Chloride: 105 mmol/L (ref 98–111)
Creatinine, Ser: 0.94 mg/dL (ref 0.44–1.00)
GFR, Estimated: >60 mL/min (ref >60)
Glucose, Bld: 124 mg/dL — ABNORMAL HIGH (ref 70–99)
Potassium: 4 mmol/L (ref 3.5–5.1)
Sodium: 139 mmol/L (ref 135–145)
Total Bilirubin: 0.3 mg/dL (ref 0.0–1.2)
Total Protein: 6.6 g/dL (ref 6.5–8.1)

## 2024-01-11 LAB — CBC WITH DIFFERENTIAL/PLATELET
Abs Immature Granulocytes: 0.02 K/uL (ref 0.00–0.07)
Basophils Absolute: 0 K/uL (ref 0.0–0.1)
Basophils Relative: 1 %
Eosinophils Absolute: 0.1 K/uL (ref 0.0–0.5)
Eosinophils Relative: 3 %
HCT: 40.5 % (ref 36.0–46.0)
Hemoglobin: 13 g/dL (ref 12.0–15.0)
Immature Granulocytes: 0 %
Lymphocytes Relative: 22 %
Lymphs Abs: 1.1 K/uL (ref 0.7–4.0)
MCH: 31.3 pg (ref 26.0–34.0)
MCHC: 32.1 g/dL (ref 30.0–36.0)
MCV: 97.4 fL (ref 80.0–100.0)
Monocytes Absolute: 0.6 K/uL (ref 0.1–1.0)
Monocytes Relative: 12 %
Neutro Abs: 3.2 K/uL (ref 1.7–7.7)
Neutrophils Relative %: 62 %
Platelets: 308 K/uL (ref 150–400)
RBC: 4.16 MIL/uL (ref 3.87–5.11)
RDW: 14.6 % (ref 11.5–15.5)
WBC: 5.1 K/uL (ref 4.0–10.5)
nRBC: 0 % (ref 0.0–0.2)

## 2024-01-11 MED ORDER — SODIUM CHLORIDE 0.9 % IV SOLN
1500.0000 mg | Freq: Once | INTRAVENOUS | Status: AC
  Administered 2024-01-11: 1500 mg via INTRAVENOUS
  Filled 2024-01-11: qty 30

## 2024-01-11 MED ORDER — CLINDAMYCIN HCL 300 MG PO CAPS: 300.0000 mg | ORAL_CAPSULE | Freq: Three times a day (TID) | ORAL | 0 refills | Status: AC

## 2024-01-11 MED ORDER — SODIUM CHLORIDE 0.9 % IV SOLN: INTRAVENOUS | Status: DC

## 2024-01-11 NOTE — Progress Notes (Signed)
 Patient tolerated chemotherapy with no complaints voiced.  Side effects with management reviewed with understanding verbalized.  Port site clean and dry with no bruising or swelling noted at site.  Good blood return noted before and after administration of chemotherapy.  Band aid applied.  Patient left in satisfactory condition with VSS and no s/s of distress noted. All follow ups as scheduled.   Venkat Ankney [employer]

## 2024-01-11 NOTE — Progress Notes (Signed)
 Patient Care Team: Dow Longs, PA-C as PCP - General (Family Medicine) Delford Maude BROCKS, MD as PCP - Cardiology (Cardiology) Davonna Siad, MD as Medical Oncologist (Medical Oncology) Celestia Joesph SQUIBB, RN as Oncology Nurse Navigator (Medical Oncology)  Clinic Day:  01/11/2024  Referring physician: Dow Longs, PA-C   CHIEF COMPLAINT:  CC: Stage IVA Small cell lug carcinoma    ASSESSMENT & PLAN:   Assessment & Plan:  Virginia Powell  is a 73 y.o. 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 Oncology history below.  Discussed the patient's case at lung tumor board at diagnosis, consensus to treat it like a limited stage small cell lung cancer with chemoRT  Treatment response scan showed new lung nodules but biopsy consistent with Mycobacterium  Patient was evaluated for clinical trial at [facility] (Dr.Patel)  CT obtained prior to start of durvalumab  showed some new nodules, unknown at this time if it is infection versus carcinoma. 04/20/2023: Started on maintenance durvalumab  Recent CT scan with a nodule in the superior segment of left lower lobe which was slightly increased in size and character.  This was biopsied and did not show any malignant cells.  -We discussed the pathology results in detail.  Patient did have some atypical cells but also one of the sample showed Mycobacterium.  Recommended close follow-up at this time. -Will review this at multidisciplinary tumor board - Will continue maintenance durvalumab .  Patient tolerating treatment well.  Cycle 10-day 1 today.  Reports no rash, diarrhea, shortness of breath.   -Labs reviewed today: Creatinine: 0.94, normal LFTs, CBC: WNL. -Physical exam stable today.  Proceed with treatment today. 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 -  Recent MRI brain with no evidence of disease.  Will obtain MRI brain every 6 months.  Next one due 04/2024 - Repeat CT scan in 3 months 01/2024.   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  Was seen by ID and currently on observation no active treatment.  Patient reports occasional cough and shortness of breath during cough.   -Continue to follow with ID - Will discuss the recent biopsy results with Dr. Fleeta Rothman  Tobacco use Continues to smoke, reduced to one cigarette a day. Decreased urge noted.  - Encouraged continued reduction and cessation of smoking.  The patient understands the plans discussed today and is in agreement with them.  She knows to contact our office if she develops concerns prior to her next appointment.  The total time spent in the appointment was 20 minutes for the encounter  with patient, including review of chart and various tests results, discussions about plan of care and coordination of care plan   I,Helena R Teague,acting as a scribe for Siad Davonna, MD.,have documented all relevant documentation on the behalf of Siad Davonna, MD,as directed by  Siad Davonna, MD while in the presence of Siad Davonna, MD.  I, Siad Davonna MD, have reviewed the above documentation for accuracy and completeness, and I agree with the above.    Siad Davonna, MD  [facility] [address] [place] [address] Dept: [phone] Dept Fax: [phone]     ONCOLOGY HISTORY:   Diagnosis:Small cell carcinoma metastatic to both lungs  -  08/04/2022: CT chest without contrast: Growing solid nodule of the right lower lobe measuring 8 mm and new irregular solid nodule of the left lower lobe measuring 9.3 mm. Lung-RADS 4B, suspicious.  -09/09/2022: PET scan:  Hypermetabolic right hilar adenopathy and bilateral  pulmonary nodules, findings indicative of synchronous primary bronchogenic carcinomas. No evidence of distant metastatic disease. Vague slight thickening of lateral breast soft tissue with metabolism just below blood pool. Consider mammographic correlation, as clinically indicated. Slight marginal irregularity of the liver raises suspicion for cirrhosis. Bilateral adrenal adenomas. -10/04/2022: Pathology Results:  A. LUNG, LLL, FINE NEEDLE ASPIRATION:  - No malignant cells identified  - Granulomatous inflammation  B. LUNG, LLL, BRUSHING:  - No malignant cells identified  - Granulomatous inflammation D. LUNG, RLL, FINE NEEDLE ASPIRATION:  - No malignant cells identified  E. LYMPH NODE, 11R, FINE NEEDLE ASPIRATION:  - Small cell carcinoma  - Lymphoid tissue present  -11/18/2022: PET scan: Interval enlargement of the superior segment left lower lobe lesion with stable hypermetabolism. Stable 9 mm medial right lower lobe pulmonary nodule with slightly increased SUV max. Persistent hypermetabolic right hilar adenopathy. New 10 mm sub solid lesion in the left lower lobe with SUV max of 2.14. This is likely inflammatory. Attention on future studies is suggested. Stable 14 mm linear density in the right lateral breast with low level FDG uptake. An indolent breast cancer is possible. No findings for abdominal/pelvic metastatic disease or osseous metastatic disease. Stable benign bilateral adrenal gland adenomas. -11/22/2022: MRI Brain: No evidence of intracranial metastatic disease.  -11/30/2022-02/04/23: Chemo RT with carboplatin and Etoposide  - 12/13/2022-01/07/2023: Radiation: RT with Dr.Morris at [facility] - 02/17/2023: CT CAP: CHEST: New bilateral pulmonary nodules. Interval decrease in size of RIGHT hilar lymph nodes. Decrease in size of previously described pulmonary nodules. PELVIS: No evidence of metastatic disease in the abdomen pelvis. Stable benign adrenal adenomas.Sigmoid diverticulosis without  diverticulitis. -03/17/2023: MRI Brain: No evidence of metastatic disease. -03/29/2023: Bronchoscopy with biopsy:  Pathology: Atypical cells AFB culture: Positive for Mycobacterium -04/19/2023: CT CAP: Mixed response to therapy with some pulmonary nodules increased in size, some decreased in size, and some resolved. Multiple new right lower lobe and right middle lobe nodules have a tree-in-bud appearance suggesting an infectious or inflammatory process. Consider short-term interval follow-up after completion of therapy to ensure resolution. No evidence of metastatic disease in the abdomen or pelvis. -04/20/2023-  Current:  Maintenance durvalumab  1500mg  every 28 days -06/14/2023: MRI Brain: No evidence of intracranial metastasis. Unchanged background of mild chronic small vessel disease. -08/10/2023: CT CAP with contrast: Multiple bilateral spiculated pulmonary nodules containing biopsy marking clips are again seen and slightly diminished in size consistent with treatment response. Multiple additional areas of nodularity and consolidation are fluctuant when compared to prior examination, particularly, dense consolidation of the dependent right lower lobe is almost completely resolved, with new small nodules and consolidation of the peripheral left lung base. Findings are consistent with nonspecific superimposed atypical infection or aspiration. Unchanged treated right hilar and subcarinal lymph nodes. No evidence of lymphadenopathy or metastatic disease in the abdomen or pelvis. Unchanged bilateral adrenal adenomata, previously without FDG avidity. Emphysema and diffuse bilateral bronchial wall thickening. -11/07/2023: MRI Brain: No evidence of intracranial metastatic disease.  -11/07/2023: CT CAP: Multiple spiculated bilateral pulmonary nodules again seen. Some of these are slightly diminished in size, however there is a nodule in the superior segment left lower lobe which is slightly increased in size and  solid character, and there is additionally a  new nodule in the posterior right upper lobe. Findings are consistent with mixed response to treatment. No evidence of lymphadenopathy or metastatic disease in the abdomen or pelvis. -12/20/2023: FNA of RUL. : Cytology: FNA and washing negative for malignancy.   - Left lower superior segment lung, FNA: Atypical cells present, favor reactive.  Necrotic material with rare AFB microorganism.  GMS stain is negative for yeast/fungus  Current Treatment: Completed Chemo RT with Cisplatin  + Etoposide . On maintenance Durvalumab    INTERVAL HISTORY:  Guida Camaya Gannett is a 73 year old female who presents for follow-up for small cell lung carcinoma. She is unaccompanied today.   We discussed EBUS with FNA biopsy results, which was negative for malignancy, and recommendation for future imaging. She denies any rash or diarrhea. Charrie does note SOB, associated with allergies but is not bothersome.   She reports recurrent dental infections, beginning after her biopsy, with swelling on her right cheek and lower jaw. Elnora took Clindamycin  that she had at home, which has significantly improved symptoms.   I have reviewed the past medical history, past surgical history, social history and family history with the patient and they are unchanged from previous note.  ALLERGIES:  is allergic to morphine and codeine.  MEDICATIONS:  Current Outpatient Medications  Medication Sig Dispense Refill   albuterol (PROVENTIL) (2.5 MG/3ML) 0.083% nebulizer solution SMARTSIG:1 Vial(s) Via Nebulizer Every 4-6 Hours PRN     albuterol (VENTOLIN HFA) 108 (90 Base) MCG/ACT inhaler SMARTSIG:2 Puff(s) By Mouth Every 4 Hours     atorvastatin (LIPITOR) 40 MG tablet Take 40 mg by mouth daily.     benzonatate (TESSALON) 100 MG capsule Take 100 mg by mouth 3 (three) times daily as needed for cough.     ferrous sulfate 324 MG TBEC Take 324 mg by mouth.     fexofenadine (ALLEGRA) 180 MG  tablet Take 180 mg by mouth daily as needed for allergies or rhinitis.     fluticasone (FLONASE) 50 MCG/ACT nasal spray Place into both nostrils daily as needed.     hydrochlorothiazide (HYDRODIURIL) 12.5 MG tablet Take 12.5 mg by mouth daily.     hydrOXYzine (ATARAX) 25 MG tablet Take 1 tablet by mouth at bedtime as needed.     lidocaine -prilocaine  (EMLA ) cream Apply a quarter-sized amount to port a cath site and cover with plastic wrap 1 hour prior to infusion appointments 30 g 3   loratadine (CLARITIN) 10 MG tablet Take 10 mg by mouth daily as needed for allergies.     olmesartan  (BENICAR ) 40 MG tablet Take 1 tablet (40 mg total) by mouth daily. 30 tablet 0   ondansetron  (ZOFRAN ) 8 MG tablet Take 1 tablet (8 mg total) by mouth every 8 (eight) hours as needed for nausea or vomiting. 30 tablet 1   prochlorperazine  (COMPAZINE ) 10 MG tablet Take 1 tablet (10 mg total) by mouth every 6 (six) hours as needed for nausea or vomiting. 30 tablet 1   rizatriptan (MAXALT-MLT) 10 MG disintegrating tablet Take 10 mg by mouth as needed for migraine. May repeat in 2 hours if needed     sucralfate  (CARAFATE ) 1 GM/10ML suspension Take 10 mLs (1 g total) by mouth 4 (four) times daily -  with meals and at bedtime. 420 mL 0   TRELEGY ELLIPTA 100-62.5-25 MCG/ACT AEPB Inhale 1 puff into the lungs daily.     clindamycin  (CLEOCIN ) 300 MG capsule Take 1 capsule (300 mg total) by mouth 3 (three) times daily. 15 capsule 0  No current facility-administered medications for this visit.     REVIEW OF SYSTEMS:   All the systems were reviewed with the patient and are negative except HPI.   VITALS:  Blood pressure 139/84, pulse 89, temperature 99.2 F (37.3 C), temperature source Tympanic, resp. rate 18, height 5' 2 (1.575 m), weight 100 lb (45.4 kg), SpO2 97%.  Wt Readings from Last 3 Encounters:  01/11/24 100 lb (45.4 kg)  12/27/23 100 lb 3.2 oz (45.5 kg)  12/20/23 96 lb (43.5 kg)    Performance status (ECOG): 0  - Asymptomatic  PHYSICAL EXAM:   GENERAL:alert, no distress and comfortable SKIN: skin color, texture, turgor are normal, no rashes or significant lesions LUNGS: clear to auscultation and percussion with normal breathing effort HEART: regular rate & rhythm and no murmurs and no lower extremity edema ABDOMEN:abdomen soft, non-tender and normal bowel sounds Musculoskeletal:no cyanosis of digits and no clubbing  NEURO: alert & oriented x 3 with fluent speech  LABORATORY DATA:  I have reviewed the data as listed    Component Value Date/Time   NA 139 01/11/2024 0919   K 4.0 01/11/2024 0919   CL 105 01/11/2024 0919   CO2 23 01/11/2024 0919   GLUCOSE 124 (H) 01/11/2024 0919   BUN 24 (H) 01/11/2024 0919   CREATININE 0.94 01/11/2024 0919   CALCIUM 9.1 01/11/2024 0919   PROT 6.6 01/11/2024 0919   ALBUMIN 4.1 01/11/2024 0919   AST 22 01/11/2024 0919   ALT 25 01/11/2024 0919   ALKPHOS 57 01/11/2024 0919   BILITOT 0.3 01/11/2024 0919   GFRNONAA >60 01/11/2024 0919    Lab Results  Component Value Date   WBC 5.1 01/11/2024   NEUTROABS 3.2 01/11/2024   HGB 13.0 01/11/2024   HCT 40.5 01/11/2024   MCV 97.4 01/11/2024   PLT 308 01/11/2024      Chemistry      Component Value Date/Time   NA 139 01/11/2024 0919   K 4.0 01/11/2024 0919   CL 105 01/11/2024 0919   CO2 23 01/11/2024 0919   BUN 24 (H) 01/11/2024 0919   CREATININE 0.94 01/11/2024 0919      Component Value Date/Time   CALCIUM 9.1 01/11/2024 0919   ALKPHOS 57 01/11/2024 0919   AST 22 01/11/2024 0919   ALT 25 01/11/2024 0919   BILITOT 0.3 01/11/2024 0919      RADIOGRAPHIC STUDIES:  I have personally reviewed the radiological images as listed and agreed with the findings in the report.

## 2024-01-11 NOTE — Progress Notes (Signed)
 Patient has been examined by Dr. Davonna. Vital signs and labs have been reviewed by MD - ANC, Creatinine, LFTs, hemoglobin, and platelets have been reviewed by M.D. - pt may proceed with treatment.  Primary RN and pharmacy notified.

## 2024-01-11 NOTE — Patient Instructions (Signed)
 [facility] - A DEPT OF [facility]  Discharge Instructions: Thank you for choosing [facility] to provide your oncology and hematology care.  If you have a lab appointment with the Cancer Center - please note that after April 8th, 2024, all labs will be drawn in the cancer center.  You do not have to check in or register with the main entrance as you have in the past but will complete your check-in in the cancer center.  Wear comfortable clothing and clothing appropriate for easy access to any Portacath or PICC line.   We strive to give you quality time with your provider. You may need to reschedule your appointment if you arrive late (15 or more minutes).  Arriving late affects you and other patients whose appointments are after yours.  Also, if you miss three or more appointments without notifying the office, you may be dismissed from the clinic at the provider's discretion.      For prescription refill requests, have your pharmacy contact our office and allow 72 hours for refills to be completed.    Today you received the following chemotherapy and/or immunotherapy agents imfiniz   To help prevent nausea and vomiting after your treatment, we encourage you to take your nausea medication as directed.  BELOW ARE SYMPTOMS THAT SHOULD BE REPORTED IMMEDIATELY: *FEVER GREATER THAN 100.4 F (38 C) OR HIGHER *CHILLS OR SWEATING *NAUSEA AND VOMITING THAT IS NOT CONTROLLED WITH YOUR NAUSEA MEDICATION *UNUSUAL SHORTNESS OF BREATH *UNUSUAL BRUISING OR BLEEDING *URINARY PROBLEMS (pain or burning when urinating, or frequent urination) *BOWEL PROBLEMS (unusual diarrhea, constipation, pain near the anus) TENDERNESS IN MOUTH AND THROAT WITH OR WITHOUT PRESENCE OF ULCERS (sore throat, sores in mouth, or a toothache) UNUSUAL RASH, SWELLING OR PAIN  UNUSUAL VAGINAL DISCHARGE OR ITCHING   Items with * indicate a potential emergency and should be followed up as  soon as possible or go to the Emergency Department if any problems should occur.  Please show the CHEMOTHERAPY ALERT CARD or IMMUNOTHERAPY ALERT CARD at check-in to the Emergency Department and triage nurse.  Should you have questions after your visit or need to cancel or reschedule your appointment, please contact [facility] - A DEPT OF [facility] [phone]  and follow the prompts.  Office hours are 8:00 a.m. to 4:30 p.m. Monday - Friday. Please note that voicemails left after 4:00 p.m. may not be returned until the following business day.  We are closed weekends and major holidays. You have access to a nurse at all times for urgent questions. Please call the main number to the clinic [phone] and follow the prompts.  For any non-urgent questions, you may also contact your provider using MyChart. We now offer e-Visits for anyone 38 and older to request care online for non-urgent symptoms. For details visit [email or web address].   Also download the MyChart app! Go to the app store, search "MyChart", open the app, select [facility], and log in with your MyChart username and password.

## 2024-01-11 NOTE — Patient Instructions (Signed)

## 2024-01-12 ENCOUNTER — Other Ambulatory Visit: Payer: Self-pay

## 2024-01-19 LAB — FUNGUS CULTURE WITH STAIN

## 2024-01-19 LAB — FUNGAL ORGANISM REFLEX

## 2024-01-19 LAB — FUNGUS CULTURE RESULT

## 2024-02-02 LAB — ACID FAST CULTURE WITH REFLEXED SENSITIVITIES (MYCOBACTERIA)
Acid Fast Culture: NEGATIVE
Acid Fast Culture: NEGATIVE

## 2024-02-03 ENCOUNTER — Encounter: Payer: Self-pay | Admitting: Oncology

## 2024-02-06 ENCOUNTER — Other Ambulatory Visit: Payer: Self-pay | Admitting: Oncology

## 2024-02-06 ENCOUNTER — Ambulatory Visit (HOSPITAL_COMMUNITY)
Admission: RE | Admit: 2024-02-06 | Discharge: 2024-02-06 | Disposition: A | Discharge status: Home / Self Care | Source: Ambulatory Visit | Attending: Oncology | Admitting: Oncology

## 2024-02-06 DIAGNOSIS — C7801 Secondary malignant neoplasm of right lung: Secondary | ICD-10-CM | POA: Diagnosis present

## 2024-02-06 DIAGNOSIS — C7802 Secondary malignant neoplasm of left lung: Secondary | ICD-10-CM | POA: Diagnosis present

## 2024-02-06 MED ORDER — IOHEXOL 9 MG/ML PO SOLN
500.0000 mL | ORAL | Status: AC
  Administered 2024-02-06: 1000 mL via ORAL

## 2024-02-06 MED ORDER — IOHEXOL 300 MG/ML  SOLN
100.0000 mL | Freq: Once | INTRAMUSCULAR | Status: AC | PRN
  Administered 2024-02-06: 100 mL via INTRAVENOUS

## 2024-02-09 ENCOUNTER — Other Ambulatory Visit: Payer: Self-pay

## 2024-02-13 ENCOUNTER — Inpatient Hospital Stay: Attending: Oncology

## 2024-02-13 ENCOUNTER — Inpatient Hospital Stay: Admitting: Oncology

## 2024-02-13 ENCOUNTER — Encounter: Payer: Self-pay | Admitting: *Deleted

## 2024-02-13 VITALS — Temp 100.3°F | Wt 102.6 lb

## 2024-02-13 DIAGNOSIS — C7801 Secondary malignant neoplasm of right lung: Secondary | ICD-10-CM | POA: Diagnosis not present

## 2024-02-13 DIAGNOSIS — C7802 Secondary malignant neoplasm of left lung: Secondary | ICD-10-CM | POA: Diagnosis not present

## 2024-02-13 DIAGNOSIS — A319 Mycobacterial infection, unspecified: Secondary | ICD-10-CM

## 2024-02-13 DIAGNOSIS — F17210 Nicotine dependence, cigarettes, uncomplicated: Secondary | ICD-10-CM | POA: Diagnosis not present

## 2024-02-13 DIAGNOSIS — Z5112 Encounter for antineoplastic immunotherapy: Secondary | ICD-10-CM | POA: Insufficient documentation

## 2024-02-13 DIAGNOSIS — Z720 Tobacco use: Secondary | ICD-10-CM | POA: Diagnosis not present

## 2024-02-13 LAB — MAGNESIUM: Magnesium: 2.1 mg/dL (ref 1.7–2.4)

## 2024-02-13 LAB — COMPREHENSIVE METABOLIC PANEL WITH GFR
ALT: 23 U/L (ref 0–44)
AST: 20 U/L (ref 15–41)
Albumin: 4.3 g/dL (ref 3.5–5.0)
Alkaline Phosphatase: 58 U/L (ref 38–126)
Anion gap: 15 (ref 5–15)
BUN: 29 mg/dL — ABNORMAL HIGH (ref 8–23)
CO2: 19 mmol/L — ABNORMAL LOW (ref 22–32)
Calcium: 9.2 mg/dL (ref 8.9–10.3)
Chloride: 106 mmol/L (ref 98–111)
Creatinine, Ser: 0.84 mg/dL (ref 0.44–1.00)
GFR, Estimated: >60 mL/min
Glucose, Bld: 127 mg/dL — ABNORMAL HIGH (ref 70–99)
Potassium: 3.8 mmol/L (ref 3.5–5.1)
Sodium: 140 mmol/L (ref 135–145)
Total Bilirubin: 0.2 mg/dL (ref 0.0–1.2)
Total Protein: 6.6 g/dL (ref 6.5–8.1)

## 2024-02-13 LAB — CBC WITH DIFFERENTIAL/PLATELET
Abs Immature Granulocytes: 0.03 K/uL (ref 0.00–0.07)
Basophils Absolute: 0 K/uL (ref 0.0–0.1)
Basophils Relative: 0 %
Eosinophils Absolute: 0.1 K/uL (ref 0.0–0.5)
Eosinophils Relative: 1 %
HCT: 40.1 % (ref 36.0–46.0)
Hemoglobin: 12.8 g/dL (ref 12.0–15.0)
Immature Granulocytes: 0 %
Lymphocytes Relative: 12 %
Lymphs Abs: 0.9 K/uL (ref 0.7–4.0)
MCH: 31.3 pg (ref 26.0–34.0)
MCHC: 31.9 g/dL (ref 30.0–36.0)
MCV: 98 fL (ref 80.0–100.0)
Monocytes Absolute: 0.7 K/uL (ref 0.1–1.0)
Monocytes Relative: 9 %
Neutro Abs: 5.8 K/uL (ref 1.7–7.7)
Neutrophils Relative %: 78 %
Platelets: 290 K/uL (ref 150–400)
RBC: 4.09 MIL/uL (ref 3.87–5.11)
RDW: 14.9 % (ref 11.5–15.5)
WBC: 7.5 K/uL (ref 4.0–10.5)
nRBC: 0 % (ref 0.0–0.2)

## 2024-02-13 NOTE — Progress Notes (Signed)
 Patients port flushed without difficulty.  Good blood return noted with no bruising or swelling noted at site.  Labs drawn per orders.  VSS with discharge and left in satisfactory condition with no s/s of distress noted. All follow ups as scheduled.       Kierre Deines

## 2024-02-13 NOTE — Progress Notes (Unsigned)
 "  Patient Care Team: Dow Longs, PA-C as PCP - General (Family Medicine) Delford Maude BROCKS, MD as PCP - Cardiology (Cardiology) Davonna Siad, MD as Medical Oncologist (Medical Oncology) Celestia Joesph SQUIBB, RN as Oncology Nurse Navigator (Medical Oncology)  Clinic Day:  02/13/2024  Referring physician: Dow Longs, PA-C   CHIEF COMPLAINT:  CC: Stage IVA Small cell lug carcinoma    ASSESSMENT & PLAN:   Assessment & Plan:  Virginia Powell  is a 73 y.o. 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 Oncology history below.  Discussed the patient's case at lung tumor board at diagnosis, consensus to treat it like a limited stage small cell lung cancer with chemoRT  Treatment response scan showed new lung nodules but biopsy consistent with Mycobacterium  Patient was evaluated for clinical trial at [facility] (Dr.Patel)  CT obtained prior to start of durvalumab  showed some new nodules, unknown at this time if it is infection versus carcinoma. 04/20/2023: Started on maintenance durvalumab  Recent CT scan with a nodule in the superior segment of left lower lobe which was slightly increased in size and character.  This was biopsied and did not show any malignant cells.  -Reviewed the previous biopsy sample at the [facility], no malignant cells seen. - We reviewed the recent CT scan findings together. All the concerning areas are more or less stable except for hilar adenopathy which is slightly increased in size.  - Will continue maintenance durvalumab .  Patient tolerating treatment well.  Cycle 11-day 1 tomorrow Reports no rash, diarrhea, shortness of breath.   -Labs reviewed today: CMP: normal creatinine, LFTs, CBC: WNL. -Physical exam stable today.  Proceed with treatment today. 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 - Recent MRI brain with no evidence of disease.  Will obtain MRI brain every 6 months.  Next one due 04/2024 - Repeat CT scan in 3 months 04/2024   Return to clinic in 8 weeks for follow up.   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  Was seen by ID and currently on observation no active treatment.  Patient reports occasional cough and shortness of breath during cough.   -Continue to follow with ID - Will discuss the recent biopsy results with Dr. Fleeta Rothman  Tobacco use Continues to smoke, reduced to one cigarette a day. Decreased urge noted.  - Encouraged continued reduction and cessation of smoking.    The patient understands the plans discussed today and is in agreement with them.  She knows to contact our office if she develops concerns prior to her next appointment.  The total time spent in the appointment was 15 minutes for the encounter  with patient, including review of chart and various tests results, discussions about plan of care and coordination of care plan   Siad Davonna, MD  [facility] [address] [place] [address] Dept: [phone] Dept Fax: [phone]     ONCOLOGY HISTORY:   Diagnosis:Small cell carcinoma metastatic to both lungs  -08/04/2022: CT chest without contrast: Growing solid nodule of the right lower lobe measuring 8 mm and new irregular solid nodule of the left lower lobe measuring 9.3 mm. Lung-RADS 4B, suspicious.  -09/09/2022: PET scan:  Hypermetabolic right hilar adenopathy and bilateral  pulmonary nodules, findings indicative of synchronous primary bronchogenic carcinomas. No evidence of distant metastatic disease. Vague slight thickening of lateral breast soft tissue with metabolism just below blood pool. Consider mammographic correlation, as clinically indicated. Slight marginal irregularity  of the liver raises suspicion for cirrhosis. Bilateral adrenal adenomas. -10/04/2022: Pathology Results:  A. LUNG, LLL, FINE NEEDLE ASPIRATION:  - No malignant cells identified  - Granulomatous inflammation  B. LUNG, LLL, BRUSHING:  - No malignant cells identified  - Granulomatous inflammation D. LUNG, RLL, FINE NEEDLE ASPIRATION:  - No malignant cells identified  E. LYMPH NODE, 11R, FINE NEEDLE ASPIRATION:  - Small cell carcinoma  - Lymphoid tissue present  -11/18/2022: PET scan: Interval enlargement of the superior segment left lower lobe lesion with stable hypermetabolism. Stable 9 mm medial right lower lobe pulmonary nodule with slightly increased SUV max. Persistent hypermetabolic right hilar adenopathy. New 10 mm sub solid lesion in the left lower lobe with SUV max of 2.14. This is likely inflammatory. Attention on future studies is suggested. Stable 14 mm linear density in the right lateral breast with low level FDG uptake. An indolent breast cancer is possible. No findings for abdominal/pelvic metastatic disease or osseous metastatic disease. Stable benign bilateral adrenal gland adenomas. -11/22/2022: MRI Brain: No evidence of intracranial metastatic disease.  -11/30/2022-02/04/23: Chemo RT with carboplatin and Etoposide  - 12/13/2022-01/07/2023: Radiation: RT with Dr.Morris at [facility] - 02/17/2023: CT CAP: CHEST: New bilateral pulmonary nodules. Interval decrease in size of RIGHT hilar lymph nodes. Decrease in size of previously described pulmonary nodules. PELVIS: No evidence of metastatic disease in the abdomen pelvis. Stable benign adrenal adenomas.Sigmoid diverticulosis without diverticulitis. -03/17/2023: MRI Brain: No evidence of metastatic disease. -03/29/2023: Bronchoscopy with biopsy:  Pathology: Atypical cells AFB culture: Positive for Mycobacterium -04/19/2023: CT CAP: Mixed response to therapy with some pulmonary nodules increased in size, some decreased in size, and some  resolved. Multiple new right lower lobe and right middle lobe nodules have a tree-in-bud appearance suggesting an infectious or inflammatory process. Consider short-term interval follow-up after completion of therapy to ensure resolution. No evidence of metastatic disease in the abdomen or pelvis. -04/20/2023-  Current:  Maintenance durvalumab  1500mg  every 28 days -06/14/2023: MRI Brain: No evidence of intracranial metastasis. Unchanged background of mild chronic small vessel disease. -08/10/2023: CT CAP with contrast: Multiple bilateral spiculated pulmonary nodules containing biopsy marking clips are again seen and slightly diminished in size consistent with treatment response. Multiple additional areas of nodularity and consolidation are fluctuant when compared to prior examination, particularly, dense consolidation of the dependent right lower lobe is almost completely resolved, with new small nodules and consolidation of the peripheral left lung base. Findings are consistent with nonspecific superimposed atypical infection or aspiration. Unchanged treated right hilar and subcarinal lymph nodes. No evidence of lymphadenopathy or metastatic disease in the abdomen or pelvis. Unchanged bilateral adrenal adenomata, previously without FDG avidity. Emphysema and diffuse bilateral bronchial wall thickening. -11/07/2023: MRI Brain: No evidence of intracranial metastatic disease.  -11/07/2023: CT CAP: Multiple spiculated bilateral pulmonary nodules again seen. Some of these are slightly diminished in size, however there is a nodule in the superior segment left lower lobe which is slightly increased in size and solid character, and there is additionally a new nodule in the posterior right upper lobe. Findings are consistent with mixed response to treatment. No evidence of lymphadenopathy or metastatic disease in the abdomen or pelvis. -12/20/2023: FNA of RUL. : Cytology: FNA and washing negative for malignancy.   -  Left lower superior segment lung, FNA: Atypical cells present, favor reactive.  Necrotic material with rare AFB microorganism.  GMS stain is negative for yeast/fungus  Current Treatment: Completed Chemo RT with Cisplatin  + Etoposide . On maintenance Durvalumab    INTERVAL HISTORY:  Discussed the use of AI scribe software for clinical note transcription with the patient, who gave verbal consent to proceed.  History of Present Illness Virginia Powell is a 73 year old female with small cell lung carcinoma on maintenance durvalumab  is here for follow up and to review CT scan results.   Recent CT imaging showed that most lesions remained unchanged, with one lymph node noted to be slightly larger.   She experienced intermittent diarrhea for two days, which she associates with ingestion of cashew nuts. She has a history of gastrointestinal upset with peanuts but has not consumed them in over a year. She currently denies ongoing diarrhea or rash.  She notes mild facial erythema in the mornings and reports using honey at night before bed. No other complaints or new symptoms were reported.    I have reviewed the past medical history, past surgical history, social history and family history with the patient and they are unchanged from previous note.  ALLERGIES:  is allergic to morphine and codeine.  MEDICATIONS:  Current Outpatient Medications  Medication Sig Dispense Refill   albuterol (PROVENTIL) (2.5 MG/3ML) 0.083% nebulizer solution SMARTSIG:1 Vial(s) Via Nebulizer Every 4-6 Hours PRN     albuterol (VENTOLIN HFA) 108 (90 Base) MCG/ACT inhaler SMARTSIG:2 Puff(s) By Mouth Every 4 Hours     atorvastatin (LIPITOR) 40 MG tablet Take 40 mg by mouth daily.     benzonatate (TESSALON) 100 MG capsule Take 100 mg by mouth 3 (three) times daily as needed for cough.     clindamycin  (CLEOCIN ) 300 MG capsule Take 1 capsule (300 mg total) by mouth 3 (three) times daily. 15 capsule 0   ferrous sulfate 324  MG TBEC Take 324 mg by mouth.     fexofenadine (ALLEGRA) 180 MG tablet Take 180 mg by mouth daily as needed for allergies or rhinitis.     fluticasone (FLONASE) 50 MCG/ACT nasal spray Place into both nostrils daily as needed.     hydrochlorothiazide (HYDRODIURIL) 12.5 MG tablet Take 12.5 mg by mouth daily.     hydrOXYzine (ATARAX) 25 MG tablet Take 1 tablet by mouth at bedtime as needed.     lidocaine -prilocaine  (EMLA ) cream Apply a quarter-sized amount to port a cath site and cover with plastic wrap 1 hour prior to infusion appointments 30 g 3   loratadine (CLARITIN) 10 MG tablet Take 10 mg by mouth daily as needed for allergies.     olmesartan  (BENICAR ) 40 MG tablet Take 1 tablet (40 mg total) by mouth daily. 30 tablet 0   ondansetron  (ZOFRAN ) 8 MG tablet Take 1 tablet (8 mg total) by mouth every 8 (eight) hours as needed for nausea or vomiting. 30 tablet 1   prochlorperazine  (COMPAZINE ) 10 MG tablet Take 1 tablet (10 mg total) by mouth every 6 (six) hours as needed for nausea or vomiting. 30 tablet 1   rizatriptan (MAXALT-MLT) 10 MG disintegrating tablet Take 10 mg by mouth as needed for migraine. May repeat in 2 hours if needed     sucralfate  (CARAFATE ) 1 GM/10ML suspension Take 10 mLs (1 g total) by mouth 4 (four) times daily -  with meals and at bedtime. 420 mL 0   TRELEGY ELLIPTA 100-62.5-25 MCG/ACT AEPB Inhale 1 puff into  the lungs daily.     No current facility-administered medications for this visit.     REVIEW OF SYSTEMS:   All the systems were reviewed with the patient and are negative except HPI.   VITALS:  There were no vitals taken for this visit.  Wt Readings from Last 3 Encounters:  01/11/24 100 lb (45.4 kg)  12/27/23 100 lb 3.2 oz (45.5 kg)  12/20/23 96 lb (43.5 kg)    Performance status (ECOG): 0 - Asymptomatic  PHYSICAL EXAM:   GENERAL:alert, no distress and comfortable SKIN: skin color, texture, turgor are normal, no rashes or significant lesions LUNGS: clear  to auscultation and percussion with normal breathing effort HEART: regular rate & rhythm and no murmurs and no lower extremity edema ABDOMEN:abdomen soft, non-tender and normal bowel sounds Musculoskeletal:no cyanosis of digits and no clubbing  NEURO: alert & oriented x 3 with fluent speech  LABORATORY DATA:  I have reviewed the data as listed    Component Value Date/Time   NA 139 01/11/2024 0919   K 4.0 01/11/2024 0919   CL 105 01/11/2024 0919   CO2 23 01/11/2024 0919   GLUCOSE 124 (H) 01/11/2024 0919   BUN 24 (H) 01/11/2024 0919   CREATININE 0.94 01/11/2024 0919   CALCIUM 9.1 01/11/2024 0919   PROT 6.6 01/11/2024 0919   ALBUMIN 4.1 01/11/2024 0919   AST 22 01/11/2024 0919   ALT 25 01/11/2024 0919   ALKPHOS 57 01/11/2024 0919   BILITOT 0.3 01/11/2024 0919   GFRNONAA >60 01/11/2024 0919    Lab Results  Component Value Date   WBC 5.1 01/11/2024   NEUTROABS 3.2 01/11/2024   HGB 13.0 01/11/2024   HCT 40.5 01/11/2024   MCV 97.4 01/11/2024   PLT 308 01/11/2024      Chemistry      Component Value Date/Time   NA 139 01/11/2024 0919   K 4.0 01/11/2024 0919   CL 105 01/11/2024 0919   CO2 23 01/11/2024 0919   BUN 24 (H) 01/11/2024 0919   CREATININE 0.94 01/11/2024 0919      Component Value Date/Time   CALCIUM 9.1 01/11/2024 0919   ALKPHOS 57 01/11/2024 0919   AST 22 01/11/2024 0919   ALT 25 01/11/2024 0919   BILITOT 0.3 01/11/2024 0919      RADIOGRAPHIC STUDIES:  I have personally reviewed the radiological images as listed and agreed with the findings in the report.   CT CHEST ABDOMEN PELVIS W CONTRAST CLINICAL DATA:  Metastatic disease evaluation. Small cell carcinoma.  EXAM: CT CHEST, ABDOMEN, AND PELVIS WITH CONTRAST  TECHNIQUE: Multidetector CT imaging of the chest, abdomen and pelvis was performed following the standard protocol during bolus administration of intravenous contrast.  RADIATION DOSE REDUCTION: This exam was performed according to  the departmental dose-optimization program which includes automated exposure control, adjustment of the mA and/or kV according to patient size and/or use of iterative reconstruction technique.  CONTRAST:  OMNIPAQUE  IOHEXOL  300 MG/ML  SOLN  COMPARISON:  Chest CT dated 12/13/2023.  FINDINGS: CT CHEST FINDINGS  Cardiovascular: There is no cardiomegaly. Trace pericardial effusion. Moderate atherosclerotic calcification of the thoracic aorta. No aneurysmal dilatation or dissection. The origins of the great vessels of the aortic arch and the central pulmonary arteries are patent.  Mediastinum/Nodes: Right hilar adenopathy measures 17 x 20 mm slightly increased in size since 11/07/2023. The esophagus is grossly unremarkable. No mediastinal fluid collection.  Lungs/Pleura: Background of emphysema. Multiple bilateral spiculated pulmonary nodules with slight interval decrease  in size of several nodules since the prior CT. For example a 5 x 5 mm nodule in the posterior right upper lobe (41/5) previously measured 6 x 8 mm. Overall the majority of the nodules are relatively similar in size compared to prior CT.  Musculoskeletal: No acute osseous pathology.  CT ABDOMEN PELVIS FINDINGS  No intra-abdominal free air or free fluid.  Hepatobiliary: The liver is unremarkable. There is no biliary dilatation. The gallbladder is unremarkable.  Pancreas: Unremarkable. No pancreatic ductal dilatation or surrounding inflammatory changes.  Spleen: Normal in size without focal abnormality.  Adrenals/Urinary Tract: Enlargement of the adrenal glands bilaterally similar to prior CT of 11/07/2023 in keeping with previously described adenomata.  Stomach/Bowel: There is severe sigmoid diverticulosis. There is no bowel obstruction or active inflammation. The appendix is normal.  Vascular/Lymphatic: Mild aortoiliac atherosclerotic disease. The IVC is unremarkable. No portal venous gas. There is  no adenopathy.  Reproductive: Hysterectomy.  No suspicious Rx osseous  Other: None  Musculoskeletal: No acute or significant osseous findings.  IMPRESSION: 1. Bilateral pulmonary spiculated nodules appear relatively similar to prior CT. Slight interval decrease in the size of the nodule in the posterior right upper lobe. 2. Right hilar adenopathy slightly increased in size since the prior CT. 3. No acute intra-abdominal or pelvic pathology. No evidence of metastatic disease in the abdomen pelvis. 4. Severe sigmoid diverticulosis. No bowel obstruction. Normal appendix. 5. Aortic Atherosclerosis (ICD10-I70.0) and Emphysema (ICD10-J43.9).  Electronically Signed   By: Vanetta Chou M.D.   On: 02/06/2024 16:02   "

## 2024-02-13 NOTE — Patient Instructions (Signed)
 [facility] Discharge Instructions   You were seen and examined today by Dr. Davonna.  She reviewed the results of your lab work which are normal/stable.   We will proceed with your treatment tomorrow.    Return as scheduled.    Thank you for choosing [facility] at [facility] to provide your oncology and hematology care.  To afford each patient quality time with our provider, please arrive at least 15 minutes before your scheduled appointment time.   If you have a lab appointment with the Cancer Center please come in thru the Main Entrance and check in at the main information desk.  You need to re-schedule your appointment should you arrive 10 or more minutes late.  We strive to give you quality time with our providers, and arriving late affects you and other patients whose appointments are after yours.  Also, if you no show three or more times for appointments you may be dismissed from the clinic at the providers discretion.     Again, thank you for choosing [facility].  Our hope is that these requests will decrease the amount of time that you wait before being seen by our physicians.       _____________________________________________________________  Should you have questions after your visit to [facility], please contact our office at [phone] and follow the prompts.  Our office hours are 8:00 a.m. and 4:30 p.m. Monday - Friday.  Please note that voicemails left after 4:00 p.m. may not be returned until the following business day.  We are closed weekends and major holidays.  You do have access to a nurse 24-7, just call the main number to the clinic [phone] and do not press any options, hold on the line and a nurse will answer the phone.    For prescription refill requests, have your pharmacy contact our office and allow 72 hours.    Due to Covid, you will need to wear a mask upon entering  the hospital. If you do not have a mask, a mask will be given to you at the Main Entrance upon arrival. For doctor visits, patients may have 1 support person age 92 or older with them. For treatment visits, patients can not have anyone with them due to social distancing guidelines and our immunocompromised population.

## 2024-02-14 ENCOUNTER — Other Ambulatory Visit: Payer: Self-pay

## 2024-02-14 ENCOUNTER — Inpatient Hospital Stay

## 2024-02-14 VITALS — BP 103/72 | HR 102 | Temp 97.4°F | Resp 19

## 2024-02-14 DIAGNOSIS — C7802 Secondary malignant neoplasm of left lung: Secondary | ICD-10-CM

## 2024-02-14 DIAGNOSIS — Z5112 Encounter for antineoplastic immunotherapy: Secondary | ICD-10-CM | POA: Diagnosis not present

## 2024-02-14 MED ORDER — SODIUM CHLORIDE 0.9 % IV SOLN: INTRAVENOUS | Status: DC

## 2024-02-14 MED ORDER — SODIUM CHLORIDE 0.9 % IV SOLN
1500.0000 mg | Freq: Once | INTRAVENOUS | Status: AC
  Administered 2024-02-14: 1500 mg via INTRAVENOUS
  Filled 2024-02-14: qty 30

## 2024-02-14 NOTE — Patient Instructions (Signed)
 [facility] [place] - A DEPT OF [facility]  Discharge Instructions: Thank you for choosing [facility] to provide your oncology and hematology care.  If you have a lab appointment with the Cancer Center - please note that after April 8th, 2024, all labs will be drawn in the cancer center.  You do not have to check in or register with the main entrance as you have in the past but will complete your check-in in the cancer center.  Wear comfortable clothing and clothing appropriate for easy access to any Portacath or PICC line.   We strive to give you quality time with your provider. You may need to reschedule your appointment if you arrive late (15 or more minutes).  Arriving late affects you and other patients whose appointments are after yours.  Also, if you miss three or more appointments without notifying the office, you may be dismissed from the clinic at the provider's discretion.      For prescription refill requests, have your pharmacy contact our office and allow 72 hours for refills to be completed.    Today you received the following chemotherapy and/or immunotherapy agents Imfinzi      To help prevent nausea and vomiting after your treatment, we encourage you to take your nausea medication as directed.  BELOW ARE SYMPTOMS THAT SHOULD BE REPORTED IMMEDIATELY: *FEVER GREATER THAN 100.4 F (38 C) OR HIGHER *CHILLS OR SWEATING *NAUSEA AND VOMITING THAT IS NOT CONTROLLED WITH YOUR NAUSEA MEDICATION *UNUSUAL SHORTNESS OF BREATH *UNUSUAL BRUISING OR BLEEDING *URINARY PROBLEMS (pain or burning when urinating, or frequent urination) *BOWEL PROBLEMS (unusual diarrhea, constipation, pain near the anus) TENDERNESS IN MOUTH AND THROAT WITH OR WITHOUT PRESENCE OF ULCERS (sore throat, sores in mouth, or a toothache) UNUSUAL RASH, SWELLING OR PAIN  UNUSUAL VAGINAL DISCHARGE OR ITCHING   Items with * indicate a potential emergency and should be followed up  as soon as possible or go to the Emergency Department if any problems should occur.  Please show the CHEMOTHERAPY ALERT CARD or IMMUNOTHERAPY ALERT CARD at check-in to the Emergency Department and triage nurse.  Should you have questions after your visit or need to cancel or reschedule your appointment, please contact [facility] [place] - A DEPT OF [facility] [phone]  and follow the prompts.  Office hours are 8:00 a.m. to 4:30 p.m. Monday - Friday. Please note that voicemails left after 4:00 p.m. may not be returned until the following business day.  We are closed weekends and major holidays. You have access to a nurse at all times for urgent questions. Please call the main number to the clinic [phone] and follow the prompts.  For any non-urgent questions, you may also contact your provider using MyChart. We now offer e-Visits for anyone 42 and older to request care online for non-urgent symptoms. For details visit [email or web address].   Also download the MyChart app! Go to the app store, search "MyChart", open the app, select [place], and log in with your MyChart username and password.

## 2024-02-14 NOTE — Progress Notes (Signed)
 Patient presents today for Imfinzi  infusion per providers order.  Vital signs and labs (02/13/24) within parameters for treatment.  Patient has no new complaints at this time.  Treatment given today per MD orders.  Stable during infusion without adverse affects.  Vital signs stable.  No complaints at this time.  Discharge from clinic ambulatory in stable condition.  Alert and oriented X 3.  Follow up with [facility] as scheduled.

## 2024-02-15 ENCOUNTER — Other Ambulatory Visit: Payer: Self-pay

## 2024-02-16 ENCOUNTER — Encounter: Payer: Self-pay | Admitting: Oncology

## 2024-03-07 ENCOUNTER — Other Ambulatory Visit: Payer: Self-pay | Admitting: Oncology

## 2024-03-07 ENCOUNTER — Encounter: Payer: Self-pay | Admitting: Oncology

## 2024-03-07 DIAGNOSIS — C7802 Secondary malignant neoplasm of left lung: Secondary | ICD-10-CM

## 2024-03-13 ENCOUNTER — Inpatient Hospital Stay

## 2024-03-14 ENCOUNTER — Inpatient Hospital Stay: Attending: Oncology

## 2024-03-14 ENCOUNTER — Other Ambulatory Visit: Payer: Self-pay

## 2024-03-14 ENCOUNTER — Inpatient Hospital Stay

## 2024-03-14 VITALS — BP 111/74 | HR 93 | Temp 97.4°F | Resp 19

## 2024-03-14 DIAGNOSIS — C7802 Secondary malignant neoplasm of left lung: Secondary | ICD-10-CM

## 2024-03-14 DIAGNOSIS — Z5112 Encounter for antineoplastic immunotherapy: Secondary | ICD-10-CM | POA: Diagnosis present

## 2024-03-14 DIAGNOSIS — C3490 Malignant neoplasm of unspecified part of unspecified bronchus or lung: Secondary | ICD-10-CM | POA: Insufficient documentation

## 2024-03-14 DIAGNOSIS — Z79620 Long term (current) use of immunosuppressive biologic: Secondary | ICD-10-CM | POA: Insufficient documentation

## 2024-03-14 DIAGNOSIS — C7801 Secondary malignant neoplasm of right lung: Secondary | ICD-10-CM | POA: Diagnosis not present

## 2024-03-14 LAB — MAGNESIUM: Magnesium: 2 mg/dL (ref 1.7–2.4)

## 2024-03-14 LAB — COMPREHENSIVE METABOLIC PANEL WITH GFR
ALT: 23 U/L (ref 0–44)
AST: 21 U/L (ref 15–41)
Albumin: 4.3 g/dL (ref 3.5–5.0)
Alkaline Phosphatase: 71 U/L (ref 38–126)
Anion gap: 10 (ref 5–15)
BUN: 17 mg/dL (ref 8–23)
CO2: 23 mmol/L (ref 22–32)
Calcium: 9.2 mg/dL (ref 8.9–10.3)
Chloride: 110 mmol/L (ref 98–111)
Creatinine, Ser: 0.82 mg/dL (ref 0.44–1.00)
GFR, Estimated: >60 mL/min
Glucose, Bld: 112 mg/dL — ABNORMAL HIGH (ref 70–99)
Potassium: 3.5 mmol/L (ref 3.5–5.1)
Sodium: 143 mmol/L (ref 135–145)
Total Bilirubin: 0.4 mg/dL (ref 0.0–1.2)
Total Protein: 6.6 g/dL (ref 6.5–8.1)

## 2024-03-14 LAB — CBC WITH DIFFERENTIAL/PLATELET
Abs Immature Granulocytes: 0.02 10*3/uL (ref 0.00–0.07)
Basophils Absolute: 0 10*3/uL (ref 0.0–0.1)
Basophils Relative: 0 %
Eosinophils Absolute: 0.1 10*3/uL (ref 0.0–0.5)
Eosinophils Relative: 2 %
HCT: 41.9 % (ref 36.0–46.0)
Hemoglobin: 13.4 g/dL (ref 12.0–15.0)
Immature Granulocytes: 0 %
Lymphocytes Relative: 16 %
Lymphs Abs: 1.1 10*3/uL (ref 0.7–4.0)
MCH: 31 pg (ref 26.0–34.0)
MCHC: 32 g/dL (ref 30.0–36.0)
MCV: 97 fL (ref 80.0–100.0)
Monocytes Absolute: 0.6 10*3/uL (ref 0.1–1.0)
Monocytes Relative: 8 %
Neutro Abs: 5.2 10*3/uL (ref 1.7–7.7)
Neutrophils Relative %: 74 %
Platelets: 270 10*3/uL (ref 150–400)
RBC: 4.32 MIL/uL (ref 3.87–5.11)
RDW: 14.3 % (ref 11.5–15.5)
WBC: 7 10*3/uL (ref 4.0–10.5)
nRBC: 0 % (ref 0.0–0.2)

## 2024-03-14 LAB — TSH: TSH: 2.97 u[IU]/mL (ref 0.350–4.500)

## 2024-03-14 MED ORDER — SODIUM CHLORIDE 0.9 % IV SOLN
1500.0000 mg | Freq: Once | INTRAVENOUS | Status: AC
  Administered 2024-03-14: 1500 mg via INTRAVENOUS
  Filled 2024-03-14: qty 30

## 2024-03-14 MED ORDER — SODIUM CHLORIDE 0.9 % IV SOLN: INTRAVENOUS | Status: DC

## 2024-03-14 NOTE — Progress Notes (Signed)
 Patient tolerated chemotherapy with no complaints voiced.  Side effects with management reviewed with understanding verbalized.  Port site clean and dry with no bruising or swelling noted at site.  Good blood return noted before and after administration of chemotherapy.  Band aid applied.  Patient left in satisfactory condition with VSS and no s/s of distress noted. All follow ups as scheduled.   Virginia Powell [employer]

## 2024-03-14 NOTE — Patient Instructions (Signed)
 [facility] [place] - A DEPT OF [facility]  Discharge Instructions: Thank you for choosing [facility] to provide your oncology and hematology care.  If you have a lab appointment with the Cancer Center - please note that after April 8th, 2024, all labs will be drawn in the cancer center.  You do not have to check in or register with the main entrance as you have in the past but will complete your check-in in the cancer center.  Wear comfortable clothing and clothing appropriate for easy access to any Portacath or PICC line.   We strive to give you quality time with your provider. You may need to reschedule your appointment if you arrive late (15 or more minutes).  Arriving late affects you and other patients whose appointments are after yours.  Also, if you miss three or more appointments without notifying the office, you may be dismissed from the clinic at the provider's discretion.      For prescription refill requests, have your pharmacy contact our office and allow 72 hours for refills to be completed.    Today you received the following chemotherapy and/or immunotherapy agents Imfiniz   To help prevent nausea and vomiting after your treatment, we encourage you to take your nausea medication as directed.  BELOW ARE SYMPTOMS THAT SHOULD BE REPORTED IMMEDIATELY: *FEVER GREATER THAN 100.4 F (38 C) OR HIGHER *CHILLS OR SWEATING *NAUSEA AND VOMITING THAT IS NOT CONTROLLED WITH YOUR NAUSEA MEDICATION *UNUSUAL SHORTNESS OF BREATH *UNUSUAL BRUISING OR BLEEDING *URINARY PROBLEMS (pain or burning when urinating, or frequent urination) *BOWEL PROBLEMS (unusual diarrhea, constipation, pain near the anus) TENDERNESS IN MOUTH AND THROAT WITH OR WITHOUT PRESENCE OF ULCERS (sore throat, sores in mouth, or a toothache) UNUSUAL RASH, SWELLING OR PAIN  UNUSUAL VAGINAL DISCHARGE OR ITCHING   Items with * indicate a potential emergency and should be followed up as  soon as possible or go to the Emergency Department if any problems should occur.  Please show the CHEMOTHERAPY ALERT CARD or IMMUNOTHERAPY ALERT CARD at check-in to the Emergency Department and triage nurse.  Should you have questions after your visit or need to cancel or reschedule your appointment, please contact [facility] [place] - A DEPT OF [facility] [phone]  and follow the prompts.  Office hours are 8:00 a.m. to 4:30 p.m. Monday - Friday. Please note that voicemails left after 4:00 p.m. may not be returned until the following business day.  We are closed weekends and major holidays. You have access to a nurse at all times for urgent questions. Please call the main number to the clinic [phone] and follow the prompts.  For any non-urgent questions, you may also contact your provider using MyChart. We now offer e-Visits for anyone 18 and older to request care online for non-urgent symptoms. For details visit [email or web address].   Also download the MyChart app! Go to the app store, search "MyChart", open the app, select [place], and log in with your MyChart username and password.

## 2024-03-15 ENCOUNTER — Inpatient Hospital Stay

## 2024-03-15 ENCOUNTER — Inpatient Hospital Stay: Admitting: Oncology

## 2024-03-15 LAB — T4: T4, Total: 7.6 ug/dL (ref 4.5–12.0)

## 2024-03-28 ENCOUNTER — Ambulatory Visit (HOSPITAL_COMMUNITY)

## 2024-04-04 ENCOUNTER — Ambulatory Visit: Admitting: Acute Care

## 2024-04-10 ENCOUNTER — Inpatient Hospital Stay

## 2024-04-10 ENCOUNTER — Inpatient Hospital Stay: Admitting: Oncology

## 2024-04-11 ENCOUNTER — Inpatient Hospital Stay: Admitting: Oncology

## 2024-04-11 ENCOUNTER — Inpatient Hospital Stay: Attending: Oncology

## 2024-04-11 ENCOUNTER — Inpatient Hospital Stay
